# Patient Record
Sex: Male | Born: 1960 | Race: White | Hispanic: No | Marital: Married | Smoking: Former smoker
Health system: Southern US, Community
[De-identification: ages and names within clinical notes are randomized; demographics above are authoritative.]

## PROBLEM LIST (undated history)

## (undated) DIAGNOSIS — E119 Type 2 diabetes mellitus without complications: Secondary | ICD-10-CM

## (undated) DIAGNOSIS — I10 Essential (primary) hypertension: Secondary | ICD-10-CM

## (undated) DIAGNOSIS — K1230 Oral mucositis (ulcerative), unspecified: Secondary | ICD-10-CM

## (undated) HISTORY — DX: Type 2 diabetes mellitus without complications: E11.9

## (undated) HISTORY — DX: Oral mucositis (ulcerative), unspecified: K12.30

## (undated) HISTORY — DX: Essential (primary) hypertension: I10

---

## 1981-05-08 HISTORY — PX: FEMUR SURGERY: SHX943

## 2020-03-10 ENCOUNTER — Other Ambulatory Visit: Payer: Self-pay

## 2020-03-10 ENCOUNTER — Encounter: Payer: Self-pay | Admitting: Family Medicine

## 2020-03-10 ENCOUNTER — Ambulatory Visit (INDEPENDENT_AMBULATORY_CARE_PROVIDER_SITE_OTHER): Payer: 59 | Admitting: Family Medicine

## 2020-03-10 VITALS — BP 189/96 | HR 77 | Temp 98.7°F | Resp 20 | Ht 70.0 in | Wt 345.0 lb

## 2020-03-10 DIAGNOSIS — L089 Local infection of the skin and subcutaneous tissue, unspecified: Secondary | ICD-10-CM

## 2020-03-10 DIAGNOSIS — Z7689 Persons encountering health services in other specified circumstances: Secondary | ICD-10-CM

## 2020-03-10 DIAGNOSIS — R635 Abnormal weight gain: Secondary | ICD-10-CM | POA: Diagnosis not present

## 2020-03-10 DIAGNOSIS — E1165 Type 2 diabetes mellitus with hyperglycemia: Secondary | ICD-10-CM | POA: Diagnosis not present

## 2020-03-10 DIAGNOSIS — Z125 Encounter for screening for malignant neoplasm of prostate: Secondary | ICD-10-CM

## 2020-03-10 DIAGNOSIS — K047 Periapical abscess without sinus: Secondary | ICD-10-CM

## 2020-03-10 DIAGNOSIS — Z13220 Encounter for screening for lipoid disorders: Secondary | ICD-10-CM

## 2020-03-10 DIAGNOSIS — I10 Essential (primary) hypertension: Secondary | ICD-10-CM

## 2020-03-10 DIAGNOSIS — R221 Localized swelling, mass and lump, neck: Secondary | ICD-10-CM

## 2020-03-10 LAB — POCT GLYCOSYLATED HEMOGLOBIN (HGB A1C): Hemoglobin A1C: 7.1 % — AB (ref 4.0–5.6)

## 2020-03-10 MED ORDER — HYDROCHLOROTHIAZIDE 12.5 MG PO TABS: 12.5000 mg | ORAL_TABLET | Freq: Every day | ORAL | 1 refills | Status: DC

## 2020-03-10 MED ORDER — LEVOFLOXACIN 750 MG PO TABS: 750.0000 mg | ORAL_TABLET | Freq: Every day | ORAL | 0 refills | Status: DC

## 2020-03-10 MED ORDER — CLINDAMYCIN HCL 300 MG PO CAPS: 300.0000 mg | ORAL_CAPSULE | Freq: Four times a day (QID) | ORAL | 0 refills | Status: DC

## 2020-03-10 MED ORDER — MUPIROCIN 2 % EX OINT: 1.0000 "application " | TOPICAL_OINTMENT | Freq: Two times a day (BID) | CUTANEOUS | 1 refills | Status: DC

## 2020-03-10 MED ORDER — AMLODIPINE BESYLATE 10 MG PO TABS: 10.0000 mg | ORAL_TABLET | Freq: Every day | ORAL | 1 refills | Status: DC

## 2020-03-10 MED ORDER — METRONIDAZOLE 500 MG PO TABS: 500.0000 mg | ORAL_TABLET | Freq: Two times a day (BID) | ORAL | 0 refills | Status: DC

## 2020-03-10 MED ORDER — METFORMIN HCL 500 MG PO TABS: 500.0000 mg | ORAL_TABLET | Freq: Two times a day (BID) | ORAL | 1 refills | Status: AC

## 2020-03-10 NOTE — Patient Instructions (Addendum)
I have put in an order for a head and neck ultrasound.  You should receive a call in the next few days to get this scheduled.  A referral to Ear, Nose and Throat has been placed today.  If you have not heard from the specialty office or our referral coordinator within 1 week, please let us know and we will follow up with the referral coordinator for an update.  I have sent in a prescription for amlodipine 10mg  to take 1 tablet daily, hydrochlorothiazide 12.5mg  to take 1 tablet daily and for metformin 500mg  to take 1 tablet twice daily with meals.  Have your labs drawn and we will contact you with the results  I have sent in a prescription for metronidazole 500mg  2x per day for the next 7 days and for levofloxacin 750mg  to take 1 daily x 5 days  OPTIONS FOR DENTAL FOLLOW UP CARE   No insurance or limited coverage / Most offer flat fee or sliding scale payment plan   [place], [place] - [address] [phone] [place] is a Environmental consultant. Their mission is to provide exceptional, gentle dental care to our patients at the highest quality in a friendly and caring environment. They provide a wide range of services including but not limited to routine dental care, extractions, root   [place] 23 miles away from [place]. [place], [place] - [address] [phone] [facility]: 23 miles from [place] Exams:Includes x-rays and developing a treatment plan.Basic and Hygiene Services:.Fillings.Extractions.Dentures.Partials.Crowns (Caps).Cleanings - Standard     Deep Cleanings.Sealants.Fluoride treatments.Basic Oral Cancer ScreeningsSliding Fees Scale av email website See Clinic Full Details  [facility] 33 miles away from [place], [place] - [address] [phone]) [facility]: 33 miles from [place]  including: Cleanings Fillings Extractions Dentures Partials All ages welcome.   [place]   Address: [address], [place], [place] [address] Hours: Tues 4:15PM-8PM // Weds 9am - 12pm // Thurs 1pm - 8pm (Closed other days) Phone: [phone]    [facility] [phone] Select option 1 for emergencies   Location: [facility], [place], [place], [place] [facility] Hours: No walk-ins accepted - call the day before to schedule an appointment. Check in times are 9:30 am and 1:30 pm. Services: Simple extractions, temporary fillings, pulpectomy/pulp debridement, uncomplicated abscess drainage. Payment Options: PAYMENT IS DUE AT THE TIME OF SERVICE.  Fee is usually $100-200, additional surgical procedures (e.g. abscess drainage) may be extra. Cash, checks, Visa/MasterCard accepted.  Can file Medicaid if patient is covered for dental - patient should call case worker to check. No discount for [facility] patients. Best way to get seen: MUST call the day before and get onto the schedule. Can usually be seen the next 1-2 days. No walk-ins accepted.    [facility] [phone]) This clinic caters to the indigent population and is on a lottery system. Location: [employer] [facility], [place], [place], [place] [facility] Hours: Wednesdays from 6pm - 9pm, patients seen by a lottery system. For dates, call or go to [email or web address] Services: Cleanings, fillings and simple extractions. Payment Options: DENTAL WORK IS FREE OF CHARGE. Bring proof of income or support. Best way to get seen: Arrive at 5:15 pm - this is  a lottery, NOT first come/first serve, so arriving earlier will not increase your chances of being seen.     [facility] and [place] [employer] [phone])  [facility] [phone])  [place] [phone])         [facility] [phone]    Location: [place], [place] [facility] Hours: M, W, Th, F 8am or 1:30pm, Tues 9a or 1:30 - first come/first served. Services: Simple extractions, temporary fillings, uncomplicated abscess drainage.  You do not need to be an [facility] resident. Payment Options: PAYMENT IS DUE AT THE TIME OF SERVICE. Dental insurance, otherwise sliding scale - bring proof of income or support. Depending on income and treatment needed, cost is usually $50-200. Best way to get seen: Arrive early as it is first come/first served.     [facility] [phone]    Location: [facility] Hours: Mon-Thu 8a-5p Services: Most basic dental services including extractions and fillings. Payment Options: PAYMENT IS DUE AT THE TIME OF SERVICE. Sliding scale, up to 50% off - bring proof if income or support. Medicaid with dental option accepted. Best way to get seen: Call to schedule an appointment, can usually be seen within 2 weeks OR they will try to see walk-ins - show up at [place] or 2p (you may have to wait).       [facility] RESIDENTS ONLY    Location: [facility], [place] [address], [place], [place] [address] Clinic Hours: By appointment only. Monday - Thursday 8am-5pm, Friday 8am-12pm Services: Cleanings, fillings, extractions. Payment Options: PAYMENT IS DUE AT THE TIME OF SERVICE. Cash, Visa or MasterCard. Sliding scale - $30 minimum per service. Best way to get seen: Come in to office, complete packet and make an appointment - need proof of income or support monies for each household member  and proof of [facility] residence. Usually takes about a month to get in.     [facility] [phone]    Location: [address], [place] [facility] Hours: Walk-in Urgent Care Dental Services are offered Monday-Friday mornings only. The numbers of emergencies accepted daily is limited to the number of providers available. Maximum 15 - Mondays, Wednesdays & Thursdays Maximum 10 - Tuesdays & Fridays Services: You do not need to be a [facility] resident to be seen for a dental emergency. Emergencies are defined as pain, swelling, abnormal bleeding, or dental trauma. Walkins will receive x-rays if needed. NOTE: Dental cleaning is not an emergency. Payment Options: PAYMENT IS DUE AT THE TIME OF SERVICE. Minimum co-pay is $40.00 for uninsured patients. Minimum co-pay is $3.00 for Medicaid with dental coverage. Dental Insurance is accepted and must be presented at time of visit. Medicare does not cover dental. Forms of payment: Cash, credit card, checks. Best way to get seen: If not previously registered with the clinic, walk-in dental registration begins at 7:15 am and is on a first come/first serve basis. If previously registered with the clinic, call to make an appointment.    For children / young adults - age 35 to 2 / pregnant patients  [facility] 20 miles away from [place], [place] - [address] [phone] [facility]: 20 miles from [facility]. [facility]. Provides exams, treatment, cleanings, and emergency care for financially eligible children. They take children up to age 63 who are enrolled in Medicaid or [place]; pregnant women with a Medicaid  [place], [place] [address] [phone] The  [place] offers cleaning, fluoride treatment, infant oral care, tooth brushing/flossing instruction, nutrition counseling, sealants, fillings, crowns, extractions and emergency treatment for children 25-33 years of age.Charges are based on family income.&nbs      We will plan to see you back in 1-2 weeks for hypertension follow up visit  You will receive a survey after today's visit either digitally by e-mail or paper by [place] mail. Your experiences and feedback matter to [place].  Please respond so we know how we are doing as we provide care for you.  Call us with any questions/concerns/needs.  It is my goal to be available to you for your health concerns.  Thanks for choosing me to be a partner in your healthcare needs!  Harlin Rain, FNP-C Family Nurse Practitioner [facility] Phone: [phone]

## 2020-03-10 NOTE — Progress Notes (Signed)
Subjective:    Patient ID: Joshua Bray, male    DOB: 12-29-1960, 59 y.o.   MRN: [id number]  Joshua Bray is a 59 y.o. male presenting on 03/10/2020 for Dent (recently located from [place]. Pt has a history of diabetes, hypertension and been off medications for 3-4 years ) and Dental Pain (pt currently in pain due to 2 broken molars on the Rt side. x 2 yrs. Pt haven't seen a dentist since x 8 yrs.)   HPI  Health Maintenance:  [name] presents to clinic as a new patient to establish for primary care.  He has recently relocated from [place].  His previous PCP was Sheffield Slider, PA [facility] [place].  Records will be requested.  Past medical, family, and surgical history reviewed w/ pt.  [name] has acute concerns for having chronic conditions and being without medications for approximately 3-4 years.  He also has right sided dental pain x 2 years with recent worsening of swelling/pain within the past few weeks.  Reports last time he has met with a dentist was > 8 years ago.   No flowsheet data found.  Social History   Tobacco Use  . Smoking status: Former Smoker    Years: 4.00    Quit date: 03/10/1990    Years since quitting: 30.0  . Smokeless tobacco: Never Used  Vaping Use  . Vaping Use: Never used  Substance Use Topics  . Alcohol use: Yes    Alcohol/week: 5.0 standard drinks    Types: 5 Cans of beer per week  . Drug use: Not Currently    Review of Systems  Constitutional: Negative.   HENT: Positive for dental problem and facial swelling. Negative for congestion, drooling, ear discharge, ear pain, hearing loss, mouth sores, nosebleeds, postnasal drip, rhinorrhea, sinus pressure, sinus pain, sneezing, sore throat, tinnitus, trouble swallowing and voice change.   Eyes: Negative.   Respiratory: Negative.   Cardiovascular: Negative.   Gastrointestinal: Negative.   Endocrine: Negative.   Genitourinary: Negative.     Musculoskeletal: Negative.   Skin: Negative.   Allergic/Immunologic: Negative.   Neurological: Negative.   Hematological: Positive for adenopathy. Does not bruise/bleed easily.  Psychiatric/Behavioral: Negative.    Per HPI unless specifically indicated above     Objective:    BP (!) 189/96 (BP Location: Right Arm, Cuff Size: Large)   Pulse 77   Temp 98.7 F (37.1 C) (Oral)   Resp 20   Ht 5' 10"  (1.778 m)   Wt (!) 345 lb (156.5 kg)   SpO2 97%   BMI 49.50 kg/m   Wt Readings from Last 3 Encounters:  03/10/20 (!) 345 lb (156.5 kg)    Physical Exam Vitals and nursing note reviewed.  Constitutional:      General: He is not in acute distress.    Appearance: Normal appearance. He is well-developed and well-groomed. He is morbidly obese. He is not ill-appearing or toxic-appearing.  HENT:     Head: Normocephalic and atraumatic.     Nose:     Comments: [name] is in place, covering mouth and nose.    Mouth/Throat:     Lips: Pink.     Mouth: Mucous membranes are moist.     Dentition: Dental caries, dental abscesses and gum lesions present.     Pharynx: Posterior oropharyngeal erythema present. No oropharyngeal exudate.      Comments: Swelling, enlargement (all under tongue) with area of ulceration  2nd area with swelling  off back gums, similar to soft cyst Eyes:     General:        Right eye: No discharge.        Left eye: No discharge.     Extraocular Movements: Extraocular movements intact.     Conjunctiva/sclera: Conjunctivae normal.     Pupils: Pupils are equal, round, and reactive to light.  Neck:   Cardiovascular:     Rate and Rhythm: Normal rate and regular rhythm.     Pulses: Normal pulses.     Heart sounds: Normal heart sounds. No murmur heard.  No friction rub. No gallop.   Pulmonary:     Effort: Pulmonary effort is normal. No respiratory distress.     Breath sounds: Normal breath sounds.  Musculoskeletal:     Cervical back: Edema present. No erythema,  rigidity or tenderness. Normal range of motion.     Right lower leg: No edema.     Left lower leg: No edema.  Lymphadenopathy:     Cervical: Cervical adenopathy present.     Right cervical: Superficial cervical adenopathy present.     Left cervical: Superficial cervical adenopathy present.  Skin:    General: Skin is warm and dry.     Capillary Refill: Capillary refill takes less than 2 seconds.  Neurological:     General: No focal deficit present.     Mental Status: He is alert and oriented to person, place, and time.  Psychiatric:        Attention and Perception: Attention and perception normal.        Mood and Affect: Mood and affect normal.        Speech: Speech normal.        Behavior: Behavior normal. Behavior is cooperative.        Thought Content: Thought content normal.        Cognition and Memory: Cognition and memory normal.        Judgment: Judgment normal.    Results for orders placed or performed in visit on 03/10/20  CBC with Differential  Result Value Ref Range   WBC 5.2 3.8 - 10.8 Thousand/uL   RBC 5.36 4.20 - 5.80 Million/uL   Hemoglobin 17.0 13.2 - 17.1 g/dL   HCT 48.9 38 - 50 %   MCV 91.2 80.0 - 100.0 fL   MCH 31.7 27.0 - 33.0 pg   MCHC 34.8 32.0 - 36.0 g/dL   RDW 12.8 11.0 - 15.0 %   Platelets 195 140 - 400 Thousand/uL   MPV 11.2 7.5 - 12.5 fL   Neutro Abs 2,543 1,500 - 7,800 cells/uL   Lymphs Abs 1,648 850 - 3,900 cells/uL   Absolute Monocytes 556 200 - 950 cells/uL   Eosinophils Absolute 385 15.0 - 500.0 cells/uL   Basophils Absolute 68 0.0 - 200.0 cells/uL   Neutrophils Relative % 48.9 %   Total Lymphocyte 31.7 %   Monocytes Relative 10.7 %   Eosinophils Relative 7.4 %   Basophils Relative 1.3 %  COMPLETE METABOLIC PANEL WITH GFR  Result Value Ref Range   Glucose, Bld 166 (H) 65 - 99 mg/dL   BUN 13 7 - 25 mg/dL   Creat 1.08 0.70 - 1.33 mg/dL   GFR, Est Non African American 75 > OR = 60 mL/min/1.19m   GFR, Est African American 87 > OR = 60  mL/min/1.756m  BUN/Creatinine Ratio NOT APPLICABLE 6 - 22 (calc)   Sodium 139 135 - 146 mmol/L   Potassium  4.2 3.5 - 5.3 mmol/L   Chloride 103 98 - 110 mmol/L   CO2 26 20 - 32 mmol/L   Calcium 9.3 8.6 - 10.3 mg/dL   Total Protein 6.9 6.1 - 8.1 g/dL   Albumin 4.1 3.6 - 5.1 g/dL   Globulin 2.8 1.9 - 3.7 g/dL (calc)   AG Ratio 1.5 1.0 - 2.5 (calc)   Total Bilirubin 0.6 0.2 - 1.2 mg/dL   Alkaline phosphatase (APISO) 90 35 - 144 U/L   AST 21 10 - 35 U/L   ALT 23 9 - 46 U/L  Lipid Profile  Result Value Ref Range   Cholesterol 226 (H) <200 mg/dL   HDL 35 (L) > OR = 40 mg/dL   Triglycerides 307 (H) <150 mg/dL   LDL Cholesterol (Calc) 144 (H) mg/dL (calc)   Total CHOL/HDL Ratio 6.5 (H) <5.0 (calc)   Non-HDL Cholesterol (Calc) 191 (H) <130 mg/dL (calc)  TSH + free T4  Result Value Ref Range   TSH W/REFLEX TO FT4 1.41 0.40 - 4.50 mIU/L  PSA  Result Value Ref Range   PSA 0.84 < OR = 4.0 ng/mL      Assessment & Plan:   Problem List Items Addressed This Visit      Cardiovascular and Mediastinum   Essential hypertension    Uncontrolled hypertension, reports having been off medications for approx 3-4 years.  Reports was previously on amlodipine 17m daily.  Will restart on amlodipine 173mdaily and add in HCTZ 12.61m39mor blood pressure control.  Discussed importance of taking as directed and will RTC in 4 2 weeks for re-evaluation of blood pressure.      Relevant Medications   amLODipine (NORVASC) 10 MG tablet   hydrochlorothiazide (HYDRODIURIL) 12.5 MG tablet   Other Relevant Orders   CBC with Differential (Completed)   COMPLETE METABOLIC PANEL WITH GFR (Completed)     Digestive   Tooth infection    Right upper and lower molar infections.  Unable to tolerate PCN or clindamycin.  Will tx with levaquin and flagyl, per Up to Date recommendations.  Provided with resources for local dentists to schedule with, and providers local that take patients without dental insurance.  Encouraged  to contact and schedule an appointment.      Relevant Medications   mupirocin ointment (BACTROBAN) 2 %   metroNIDAZOLE (FLAGYL) 500 MG tablet   levofloxacin (LEVAQUIN) 750 MG tablet     Endocrine   Type 2 diabetes mellitus with hyperglycemia, without long-term current use of insulin (HCC)    A1C 7.1% today.  Will restart on metformin 500m50mD WC.  Will re-evaluate tolerance for metformin in 2 weeks and encourage blood glucose measurements and logging along with dietary/lifestyle modifications.      Relevant Medications   metFORMIN (GLUCOPHAGE) 500 MG tablet   Other Relevant Orders   CBC with Differential (Completed)   COMPLETE METABOLIC PANEL WITH GFR (Completed)   POCT HgB A1C   POCT Urinalysis Dipstick     Musculoskeletal and Integument   Skin infection    Has a history of MRSA - reports he keeps mupirocin on hand should he have an ingrown hair as once it had progressed to a skin infection requiring IV ABx.  Will send in refill on mupirocin to pharmacy on file.      Relevant Medications   mupirocin ointment (BACTROBAN) 2 %   metroNIDAZOLE (FLAGYL) 500 MG tablet     Other   Encounter to establish care with new  doctor - Primary    New patient establishment to [facility] for primary care services.  Will have baseline labs drawn today, BP follow up in 2 weeks and CPE once BP has stabilized      Screening, lipid    Screening lipid panel ordered      Relevant Orders   Lipid Profile (Completed)   Screening for malignant neoplasm of prostate    Screening PSA ordered      Relevant Orders   PSA (Completed)   Weight gain    TSH ordered based on increased weight      Relevant Orders   TSH + free T4 (Completed)   Neck swelling    Bilateral neck swelling.  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  Reports it has grown in side, is non-tender, and non-mobile.    Has right sided swelling of the anterior cervical lymph node chain, hard, non-mobile, non-tender.    Discussed sending for referral to ENT provider and will order a neck ultrasound for evaluation, that ENT provider will likely order a CT and based on their treatment plan, may require surgery.  Patient agreeable to ultrasound and ENT referral.  Plan: 1. Head and neck US ordered 2. Referral to ENT placed      Relevant Orders   US Soft Tissue Head/Neck (NON-THYROID)   Ambulatory referral to ENT      Meds ordered this encounter  Medications  . amLODipine (NORVASC) 10 MG tablet    Sig: Take 1 tablet (10 mg total) by mouth daily.    Dispense:  90 tablet    Refill:  1  . hydrochlorothiazide (HYDRODIURIL) 12.5 MG tablet    Sig: Take 1 tablet (12.5 mg total) by mouth daily.    Dispense:  90 tablet    Refill:  1  . metFORMIN (GLUCOPHAGE) 500 MG tablet    Sig: Take 1 tablet (500 mg total) by mouth 2 (two) times daily with a meal.    Dispense:  180 tablet    Refill:  1  . mupirocin ointment (BACTROBAN) 2 %    Sig: Apply 1 application topically 2 (two) times daily.    Dispense:  22 g    Refill:  1  . DISCONTD: clindamycin (CLEOCIN) 300 MG capsule    Sig: Take 1 capsule (300 mg total) by mouth 4 (four) times daily for 5 days.    Dispense:  20 capsule    Refill:  0  . metroNIDAZOLE (FLAGYL) 500 MG tablet    Sig: Take 1 tablet (500 mg total) by mouth 2 (two) times daily for 7 days.    Dispense:  14 tablet    Refill:  0  . levofloxacin (LEVAQUIN) 750 MG tablet    Sig: Take 1 tablet (750 mg total) by mouth daily.    Dispense:  5 tablet    Refill:  0    Follow up plan: Return in about 2 weeks (around 03/24/2020) for HTN F/U Visit.   Harlin Rain, [place] Family Nurse Practitioner [facility] 03/10/2020, 5:14 PM

## 2020-03-11 ENCOUNTER — Other Ambulatory Visit: Payer: Self-pay | Admitting: Family Medicine

## 2020-03-11 DIAGNOSIS — E781 Pure hyperglyceridemia: Secondary | ICD-10-CM

## 2020-03-11 DIAGNOSIS — R221 Localized swelling, mass and lump, neck: Secondary | ICD-10-CM | POA: Insufficient documentation

## 2020-03-11 DIAGNOSIS — K047 Periapical abscess without sinus: Secondary | ICD-10-CM | POA: Insufficient documentation

## 2020-03-11 DIAGNOSIS — E782 Mixed hyperlipidemia: Secondary | ICD-10-CM

## 2020-03-11 DIAGNOSIS — L089 Local infection of the skin and subcutaneous tissue, unspecified: Secondary | ICD-10-CM | POA: Insufficient documentation

## 2020-03-11 LAB — CBC WITH DIFFERENTIAL/PLATELET
Absolute Monocytes: 556 cells/uL (ref 200–950)
Basophils Absolute: 68 cells/uL (ref 0–200)
Basophils Relative: 1.3 %
Eosinophils Absolute: 385 cells/uL (ref 15–500)
Eosinophils Relative: 7.4 %
HCT: 48.9 % (ref 38.5–50.0)
Hemoglobin: 17 g/dL (ref 13.2–17.1)
Lymphs Abs: 1648 cells/uL (ref 850–3900)
MCH: 31.7 pg (ref 27.0–33.0)
MCHC: 34.8 g/dL (ref 32.0–36.0)
MCV: 91.2 fL (ref 80.0–100.0)
MPV: 11.2 fL (ref 7.5–12.5)
Monocytes Relative: 10.7 %
Neutro Abs: 2543 cells/uL (ref 1500–7800)
Neutrophils Relative %: 48.9 %
Platelets: 195 10*3/uL (ref 140–400)
RBC: 5.36 10*6/uL (ref 4.20–5.80)
RDW: 12.8 % (ref 11.0–15.0)
Total Lymphocyte: 31.7 %
WBC: 5.2 10*3/uL (ref 3.8–10.8)

## 2020-03-11 LAB — COMPLETE METABOLIC PANEL WITH GFR
AG Ratio: 1.5 (calc) (ref 1.0–2.5)
ALT: 23 U/L (ref 9–46)
AST: 21 U/L (ref 10–35)
Albumin: 4.1 g/dL (ref 3.6–5.1)
Alkaline phosphatase (APISO): 90 U/L (ref 35–144)
BUN: 13 mg/dL (ref 7–25)
CO2: 26 mmol/L (ref 20–32)
Calcium: 9.3 mg/dL (ref 8.6–10.3)
Chloride: 103 mmol/L (ref 98–110)
Creat: 1.08 mg/dL (ref 0.70–1.33)
GFR, Est African American: 87 mL/min/{1.73_m2} (ref >=60)
GFR, Est Non African American: 75 mL/min/{1.73_m2} (ref >=60)
Globulin: 2.8 g/dL (calc) (ref 1.9–3.7)
Glucose, Bld: 166 mg/dL — ABNORMAL HIGH (ref 65–99)
Potassium: 4.2 mmol/L (ref 3.5–5.3)
Sodium: 139 mmol/L (ref 135–146)
Total Bilirubin: 0.6 mg/dL (ref 0.2–1.2)
Total Protein: 6.9 g/dL (ref 6.1–8.1)

## 2020-03-11 LAB — LIPID PANEL
Cholesterol: 226 mg/dL — ABNORMAL HIGH (ref <200)
HDL: 35 mg/dL — ABNORMAL LOW (ref >=40)
LDL Cholesterol (Calc): 144 mg/dL (calc) — ABNORMAL HIGH
Non-HDL Cholesterol (Calc): 191 mg/dL (calc) — ABNORMAL HIGH (ref <130)
Total CHOL/HDL Ratio: 6.5 (calc) — ABNORMAL HIGH (ref <5.0)
Triglycerides: 307 mg/dL — ABNORMAL HIGH (ref <150)

## 2020-03-11 LAB — TSH+FREE T4: TSH W/REFLEX TO FT4: 1.41 mIU/L (ref 0.40–4.50)

## 2020-03-11 LAB — PSA: PSA: 0.84 ng/mL (ref <=4.0)

## 2020-03-11 MED ORDER — ROSUVASTATIN CALCIUM 20 MG PO TABS: 20.0000 mg | ORAL_TABLET | Freq: Every day | ORAL | 3 refills | Status: DC

## 2020-03-11 MED ORDER — EZETIMIBE 10 MG PO TABS: 10.0000 mg | ORAL_TABLET | Freq: Every day | ORAL | 3 refills | Status: DC

## 2020-03-11 NOTE — Assessment & Plan Note (Signed)
TSH ordered based on increased weight

## 2020-03-11 NOTE — Assessment & Plan Note (Signed)
Bilateral neck swelling.  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  Reports it has grown in side, is non-tender, and non-mobile.    Has right sided swelling of the anterior cervical lymph node chain, hard, non-mobile, non-tender.    Discussed sending for referral to ENT provider and will order a neck ultrasound for evaluation, that ENT provider will likely order a CT and based on their treatment plan, may require surgery.  Patient agreeable to ultrasound and ENT referral.  Plan: 1. Head and neck US ordered 2. Referral to ENT placed

## 2020-03-11 NOTE — Assessment & Plan Note (Signed)
New patient establishment to [facility] for primary care services.  Will have baseline labs drawn today, BP follow up in 2 weeks and CPE once BP has stabilized

## 2020-03-11 NOTE — Assessment & Plan Note (Signed)
Screening lipid panel ordered.

## 2020-03-11 NOTE — Assessment & Plan Note (Signed)
Uncontrolled hypertension, reports having been off medications for approx 3-4 years.  Reports was previously on amlodipine 10mg  daily.  Will restart on amlodipine 10mg  daily and add in HCTZ 12.5mg  for blood pressure control.  Discussed importance of taking as directed and will RTC in 4 2 weeks for re-evaluation of blood pressure.

## 2020-03-11 NOTE — Assessment & Plan Note (Signed)
Has a history of MRSA - reports he keeps mupirocin on hand should he have an ingrown hair as once it had progressed to a skin infection requiring IV ABx.  Will send in refill on mupirocin to pharmacy on file.

## 2020-03-11 NOTE — Assessment & Plan Note (Signed)
Screening PSA ordered.

## 2020-03-11 NOTE — Assessment & Plan Note (Signed)
A1C 7.1% today.  Will restart on metformin 500mg  BID WC.  Will re-evaluate tolerance for metformin in 2 weeks and encourage blood glucose measurements and logging along with dietary/lifestyle modifications.

## 2020-03-11 NOTE — Assessment & Plan Note (Signed)
Right upper and lower molar infections.  Unable to tolerate PCN or clindamycin.  Will tx with levaquin and flagyl, per Up to Date recommendations.  Provided with resources for local dentists to schedule with, and providers local that take patients without dental insurance.  Encouraged to contact and schedule an appointment.

## 2020-03-12 ENCOUNTER — Other Ambulatory Visit: Payer: Self-pay | Admitting: Family Medicine

## 2020-03-12 ENCOUNTER — Telehealth: Payer: Self-pay

## 2020-03-12 DIAGNOSIS — K047 Periapical abscess without sinus: Secondary | ICD-10-CM

## 2020-03-12 MED ORDER — CLINDAMYCIN HCL 300 MG PO CAPS: 300.0000 mg | ORAL_CAPSULE | Freq: Four times a day (QID) | ORAL | 0 refills | Status: AC

## 2020-03-12 NOTE — Telephone Encounter (Signed)
Every antibiotic has side effects, since he is unable to tolerate penicillins and declined the clindamycin due to GI upset, this levofloxacin coupled with the metronidazole are really the only oral antibiotic options we are left with.  The other option would be to just take the metronidazole and see if this resolves the infection.  Other medications would be IV antibiotics and would need to be done through an infusion clinic or the ER.

## 2020-03-12 NOTE — Telephone Encounter (Signed)
Copied from [place] [id number]. Topic: General - Other >> Mar 12, 2020  2:59 PM Hinda Lenis D wrote: PT requesting a call back, he will not take this medication after reading side effects, looking for an alternative / evofloxacin (LEVAQUIN) 750 MG tablet [[id number]] / please advise

## 2020-03-12 NOTE — Telephone Encounter (Signed)
The pt would like to switch the abx from Levofloxacin to clindamycin like you recommended prior.

## 2020-03-17 ENCOUNTER — Ambulatory Visit
Admission: RE | Admit: 2020-03-17 | Discharge: 2020-03-17 | Disposition: A | Discharge status: Home / Self Care | Payer: 59 | Source: Ambulatory Visit | Attending: Family Medicine | Admitting: Family Medicine

## 2020-03-17 ENCOUNTER — Other Ambulatory Visit: Payer: Self-pay

## 2020-03-17 DIAGNOSIS — R221 Localized swelling, mass and lump, neck: Secondary | ICD-10-CM | POA: Insufficient documentation

## 2020-03-24 ENCOUNTER — Ambulatory Visit: Payer: Self-pay | Admitting: Family Medicine

## 2020-03-26 ENCOUNTER — Other Ambulatory Visit: Payer: Self-pay | Admitting: Otolaryngology

## 2020-03-26 DIAGNOSIS — R590 Localized enlarged lymph nodes: Secondary | ICD-10-CM

## 2020-03-26 DIAGNOSIS — R221 Localized swelling, mass and lump, neck: Secondary | ICD-10-CM

## 2020-04-05 ENCOUNTER — Ambulatory Visit: Payer: Self-pay | Admitting: Family Medicine

## 2020-04-06 NOTE — Progress Notes (Signed)
Patient for Cervical LN biopsy 04/08/2020, called and spoke with patient on phone with instructions given. Made aware to be here @ 0930, NPO after MN if deciding to have sedation along with driver for discharge after procedure. Stated understanding.

## 2020-04-07 ENCOUNTER — Other Ambulatory Visit: Payer: Self-pay

## 2020-04-07 ENCOUNTER — Other Ambulatory Visit: Payer: Self-pay | Admitting: Student

## 2020-04-07 ENCOUNTER — Ambulatory Visit
Admission: RE | Admit: 2020-04-07 | Discharge: 2020-04-07 | Disposition: A | Discharge status: Home / Self Care | Payer: 59 | Source: Ambulatory Visit | Attending: Otolaryngology | Admitting: Otolaryngology

## 2020-04-07 DIAGNOSIS — R221 Localized swelling, mass and lump, neck: Secondary | ICD-10-CM | POA: Diagnosis present

## 2020-04-07 MED ORDER — IOHEXOL 300 MG/ML  SOLN
75.0000 mL | Freq: Once | INTRAMUSCULAR | Status: AC | PRN
  Administered 2020-04-07: 75 mL via INTRAVENOUS

## 2020-04-08 ENCOUNTER — Ambulatory Visit
Admission: RE | Admit: 2020-04-08 | Discharge: 2020-04-08 | Disposition: A | Discharge status: Home / Self Care | Payer: 59 | Source: Ambulatory Visit | Attending: Otolaryngology | Admitting: Otolaryngology

## 2020-04-08 ENCOUNTER — Other Ambulatory Visit: Payer: Self-pay

## 2020-04-08 DIAGNOSIS — R590 Localized enlarged lymph nodes: Secondary | ICD-10-CM

## 2020-04-08 DIAGNOSIS — C770 Secondary and unspecified malignant neoplasm of lymph nodes of head, face and neck: Secondary | ICD-10-CM | POA: Insufficient documentation

## 2020-04-08 LAB — GLUCOSE, CAPILLARY: Glucose-Capillary: 165 mg/dL — ABNORMAL HIGH (ref 70–99)

## 2020-04-08 MED ORDER — SODIUM CHLORIDE 0.9 % IV SOLN: INTRAVENOUS | Status: DC

## 2020-04-08 NOTE — Procedures (Addendum)
Interventional Radiology Procedure Note  Procedure: US Guided Biopsy of left cervical lymph node  Complications: None  Estimated Blood Loss: < 10 mL  Findings: 45 G core biopsy of left cervical lymph node mass performed under US guidance.  Six core samples obtained and sent to Pathology.  Venetia Night. Kathlene Cote, M.D Pager:  [phone]

## 2020-04-09 ENCOUNTER — Inpatient Hospital Stay: Payer: 59

## 2020-04-09 ENCOUNTER — Inpatient Hospital Stay: Payer: 59 | Attending: Internal Medicine | Admitting: Internal Medicine

## 2020-04-09 ENCOUNTER — Encounter: Payer: Self-pay | Admitting: Internal Medicine

## 2020-04-09 DIAGNOSIS — Z79899 Other long term (current) drug therapy: Secondary | ICD-10-CM | POA: Diagnosis not present

## 2020-04-09 DIAGNOSIS — C770 Secondary and unspecified malignant neoplasm of lymph nodes of head, face and neck: Secondary | ICD-10-CM | POA: Diagnosis not present

## 2020-04-09 DIAGNOSIS — C01 Malignant neoplasm of base of tongue: Secondary | ICD-10-CM | POA: Insufficient documentation

## 2020-04-09 DIAGNOSIS — R1310 Dysphagia, unspecified: Secondary | ICD-10-CM | POA: Diagnosis not present

## 2020-04-09 DIAGNOSIS — E119 Type 2 diabetes mellitus without complications: Secondary | ICD-10-CM | POA: Diagnosis not present

## 2020-04-09 DIAGNOSIS — Z5111 Encounter for antineoplastic chemotherapy: Secondary | ICD-10-CM | POA: Insufficient documentation

## 2020-04-09 DIAGNOSIS — Z7984 Long term (current) use of oral hypoglycemic drugs: Secondary | ICD-10-CM | POA: Diagnosis not present

## 2020-04-09 DIAGNOSIS — Z7189 Other specified counseling: Secondary | ICD-10-CM

## 2020-04-09 DIAGNOSIS — Z87891 Personal history of nicotine dependence: Secondary | ICD-10-CM | POA: Insufficient documentation

## 2020-04-09 DIAGNOSIS — I10 Essential (primary) hypertension: Secondary | ICD-10-CM | POA: Insufficient documentation

## 2020-04-09 LAB — CBC WITH DIFFERENTIAL/PLATELET
Abs Immature Granulocytes: 0.02 10*3/uL (ref 0.00–0.07)
Basophils Absolute: 0.1 10*3/uL (ref 0.0–0.1)
Basophils Relative: 1 %
Eosinophils Absolute: 0.4 10*3/uL (ref 0.0–0.5)
Eosinophils Relative: 6 %
HCT: 47.5 % (ref 39.0–52.0)
Hemoglobin: 16.8 g/dL (ref 13.0–17.0)
Immature Granulocytes: 0 %
Lymphocytes Relative: 31 %
Lymphs Abs: 2.1 10*3/uL (ref 0.7–4.0)
MCH: 32.1 pg (ref 26.0–34.0)
MCHC: 35.4 g/dL (ref 30.0–36.0)
MCV: 90.6 fL (ref 80.0–100.0)
Monocytes Absolute: 0.8 10*3/uL (ref 0.1–1.0)
Monocytes Relative: 12 %
Neutro Abs: 3.3 10*3/uL (ref 1.7–7.7)
Neutrophils Relative %: 50 %
Platelets: 212 10*3/uL (ref 150–400)
RBC: 5.24 MIL/uL (ref 4.22–5.81)
RDW: 12.9 % (ref 11.5–15.5)
WBC: 6.7 10*3/uL (ref 4.0–10.5)
nRBC: 0 % (ref 0.0–0.2)

## 2020-04-09 LAB — COMPREHENSIVE METABOLIC PANEL
ALT: 36 U/L (ref 0–44)
AST: 31 U/L (ref 15–41)
Albumin: 4.3 g/dL (ref 3.5–5.0)
Alkaline Phosphatase: 77 U/L (ref 38–126)
Anion gap: 12 (ref 5–15)
BUN: 21 mg/dL — ABNORMAL HIGH (ref 6–20)
CO2: 26 mmol/L (ref 22–32)
Calcium: 9.1 mg/dL (ref 8.9–10.3)
Chloride: 100 mmol/L (ref 98–111)
Creatinine, Ser: 1.01 mg/dL (ref 0.61–1.24)
GFR, Estimated: >60 mL/min (ref >60)
Glucose, Bld: 119 mg/dL — ABNORMAL HIGH (ref 70–99)
Potassium: 3.9 mmol/L (ref 3.5–5.1)
Sodium: 138 mmol/L (ref 135–145)
Total Bilirubin: 0.7 mg/dL (ref 0.3–1.2)
Total Protein: 7.9 g/dL (ref 6.5–8.1)

## 2020-04-09 NOTE — Progress Notes (Signed)
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  Pt had lymph node biopsy to left side of neck yesterday.
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
## 2020-04-09 NOTE — Assessment & Plan Note (Addendum)
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  PET scan ordered for further staging.  Discussed with patient that if locally advanced [PET/Final staging still pending] I reviewed the options with the patient- of definitive chemotherapy and radiation therapy concurrently. Also discussed that usually, use of salvage surgery is for recurrent malignancy. I discussed that the treatments are fairly intensive; but the goal would be cure. Usually the treatments last 6-8 weeks; and I would recommend generally weekly cisplatin/ along with radiation Monday through Friday.  Given the bulky disease induction chemotherapy was discussed however given the absence of any improvement of overall survival compared to chemoradiation-I think it is reasonable to proceed with concurrent chemoradiation.  Discussed regarding oral mucositis; difficulty swallowing; weight loss; and at times need for PEG tube placement. As patient has not lost any weight; I do not think patient would need prophylactic PEG tube. With regards to IV access-recommend port placement.  Discussed the potential side effects including but not limited to-increasing fatigue, nausea vomiting, diarrhea, hair loss, sores in the mouth, increase risk of infection and also neuropathy; renal failure and hearing loss.   Thank you Dr.Bennett for allowing me to participate in the care of your pleasant patient. Please do not hesitate to contact me with questions or concerns in the interim. Discussed with Dr.Bennett.   # DISPOSITION: # PET scan ASAP # referral to Dr.Crystal - ASAP # chemo education- cisplatin weekly-RT # social work re: Ship broker # labs today- cbc/cmp # referral to IR port placement ASAP # Nutrition evaluation- re: head and neck cancer # follow up- TBD-Dr.B

## 2020-04-11 DIAGNOSIS — Z7189 Other specified counseling: Secondary | ICD-10-CM | POA: Insufficient documentation

## 2020-04-12 ENCOUNTER — Telehealth: Payer: Self-pay

## 2020-04-12 ENCOUNTER — Other Ambulatory Visit: Payer: Self-pay | Admitting: Internal Medicine

## 2020-04-12 ENCOUNTER — Encounter: Payer: Self-pay | Admitting: Internal Medicine

## 2020-04-12 LAB — SURGICAL PATHOLOGY

## 2020-04-12 NOTE — Telephone Encounter (Signed)
IR port placement set for 04/16/20 at 8:30 for 9:30.   I spoke with patient to make him aware. He is aware an IR nurse will contact him in regards to more information prior to procedure.    He is also aware of appointment scheduled on 12/21 for port lab/md/cisplatin tx. Will keep him updated on PET scan approval.

## 2020-04-12 NOTE — Addendum Note (Signed)
Addended by: Delice Bison E on: 04/12/2020 10:10 AM   Modules accepted: Orders

## 2020-04-13 ENCOUNTER — Encounter: Payer: Self-pay | Admitting: Internal Medicine

## 2020-04-13 NOTE — Progress Notes (Signed)
Patient on schedule for [facility] Placement 12/10, called and spoke with patient on phone. Made aware to be here @ 0830, NPO after MN prior to procedure, hold am dose Metformin,and driver for discharge post procedure/recovery, stated understanding.

## 2020-04-14 ENCOUNTER — Encounter: Payer: Self-pay | Admitting: Radiation Oncology

## 2020-04-15 ENCOUNTER — Other Ambulatory Visit: Payer: Self-pay | Admitting: Student

## 2020-04-15 ENCOUNTER — Other Ambulatory Visit: Payer: Self-pay

## 2020-04-15 ENCOUNTER — Other Ambulatory Visit: Payer: Self-pay | Admitting: Radiology

## 2020-04-15 ENCOUNTER — Ambulatory Visit
Admission: RE | Admit: 2020-04-15 | Discharge: 2020-04-15 | Disposition: A | Discharge status: Home / Self Care | Payer: 59 | Source: Ambulatory Visit | Attending: Radiation Oncology | Admitting: Radiation Oncology

## 2020-04-15 ENCOUNTER — Encounter: Payer: Self-pay | Admitting: Radiation Oncology

## 2020-04-15 VITALS — BP 176/86 | HR 91 | Temp 98.6°F | Wt 341.0 lb

## 2020-04-15 DIAGNOSIS — C01 Malignant neoplasm of base of tongue: Secondary | ICD-10-CM

## 2020-04-15 NOTE — Consult Note (Signed)
NEW PATIENT EVALUATION  Name: Joshua Bray  MRN: [id number]  Date:   04/15/2020     DOB: 10/06/1960   This 59 y.o. 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 malignant neoplasm of base of tongue    DIAGNOSIS: The encounter diagnosis was Malignant neoplasm of base of tongue ([place]).   PREVIOUS INVESTIGATIONS:  CT scan reviewed PET CT scan has been ordered Pathology report reviewed Clinical notes reviewed  HPI: Patient is a 59 year old male who is noticed some dysphagia and enlargement of lymph nodes over the past 2 years in his neck.  He had lost his insurance did not seek follow-up has had some issues with his teeth thought this might be due to infection.  Initially had left-sided neck cyst which was aspirated by ENT and then lost to follow-up.  He now presents with a large heterogeneous enhancing mass of the tongue measuring up to 4.5 cm he also has massively enlarged and partially necrotic bilateral metastatic cervical nodes.  Core biopsy of 1 of these nodes showed p16 positive squamous cell carcinoma.  Patient has been able to swallow although with some difficulty.  He has a PET/CT CT scan ordered for next week.  He has been seen by medical oncology and is now referred to radiation collagen for consideration of concurrent chemoradiation.  Patient is morbidly obese and his weight has been stable.  PLANNED TREATMENT REGIMEN: Concurrent chemoradiation  PAST MEDICAL HISTORY:  has a past medical history of Diabetes mellitus without complication ([place]) and Hypertension.    PAST SURGICAL HISTORY:  Past Surgical History:  Procedure Laterality Date  . FEMUR SURGERY Left 1983    FAMILY HISTORY: family  history includes Diabetes in his father; Heart disease in his father; Leukemia in his father; Thyroid disease in his mother.  SOCIAL HISTORY:  reports that he quit smoking about 30 years ago. He quit after 4.00 years of use. He has never used smokeless tobacco. He reports current alcohol use of about 5.0 standard drinks of alcohol per week. He reports previous drug use.  ALLERGIES: Penicillins  MEDICATIONS:  Current Outpatient Medications  Medication Sig Dispense Refill  . acetaminophen (TYLENOL) 500 MG tablet Take 1,000 mg by mouth 2 (two) times daily.    Marland Kitchen amLODipine (NORVASC) 10 MG tablet Take 1 tablet (10 mg total) by mouth daily. 90 tablet 1  . ezetimibe (ZETIA) 10 MG tablet Take 1 tablet (10 mg total) by mouth daily. 90 tablet 3  . hydrochlorothiazide (HYDRODIURIL) 12.5 MG tablet Take 1 tablet (12.5 mg total) by mouth daily. 90 tablet 1  . ibuprofen (ADVIL) 200 MG tablet Take 400 mg by mouth 2 (two) times daily.    . metFORMIN (GLUCOPHAGE) 500 MG tablet Take 1 tablet (500 mg total) by mouth 2 (two) times daily with a meal. 180 tablet 1  . mupirocin ointment (BACTROBAN) 2 % Apply 1 application topically 2 (two) times daily. 22 g 1  . rosuvastatin (CRESTOR) 20 MG tablet Take 1 tablet (20 mg total) by mouth daily. (Patient not taking: Reported on 04/09/2020) 90 tablet 3   No current facility-administered medications for this encounter.    ECOG PERFORMANCE STATUS:  1 - Symptomatic but completely ambulatory  REVIEW OF SYSTEMS: Patient is morbidly obese Patient denies any weight loss, fatigue, weakness, fever, chills or night sweats. Patient denies any loss of vision, blurred vision. Patient denies any ringing  of the ears or hearing loss. No irregular heartbeat. Patient denies heart murmur or history of fainting. Patient denies any chest pain or pain radiating to her upper extremities. Patient denies any shortness of breath, difficulty breathing at night, cough or hemoptysis. Patient denies  any swelling in the lower legs. Patient denies any nausea vomiting, vomiting of blood, or coffee ground material in the vomitus. Patient denies any stomach pain. Patient states has had normal bowel movements no significant constipation or diarrhea. Patient denies any dysuria, hematuria or significant nocturia. Patient denies any problems walking, swelling in the joints or loss of balance. Patient denies any skin changes, loss of hair or loss of weight. Patient denies any excessive worrying or anxiety or significant depression. Patient denies any problems with insomnia. Patient denies excessive thirst, polyuria, polydipsia. Patient denies any swollen glands, patient denies easy bruising or easy bleeding. Patient denies any recent infections, allergies or URI. Patient "s visual fields have not changed significantly in recent time.   PHYSICAL EXAM: BP (!) 176/86 (BP Location: Right Arm, Patient Position: Sitting, Cuff Size: Large)   Pulse 91   Temp 98.6 F (37 C) (Tympanic)   Wt (!) 341 lb (154.7 kg)   BMI 48.93 kg/m  Patient has met massive bilateral neck adenopathy.  Teeth are in a poor state of repair.  Well-developed well-nourished patient in NAD. HEENT reveals PERLA, EOMI, discs not visualized.  Oral cavity is clear. No oral mucosal lesions are identified. Neck is clear without evidence of cervical or supraclavicular adenopathy. Lungs are clear to A&P. Cardiac examination is essentially unremarkable with regular rate and rhythm without murmur rub or thrill. Abdomen is benign with no organomegaly or masses noted. Motor sensory and DTR levels are equal and symmetric in the upper and lower extremities. Cranial nerves II through XII are grossly intact. Proprioception is intact. No peripheral adenopathy or edema is identified. No motor or sensory levels are noted. Crude visual fields are within normal range.  LABORATORY DATA: Pathology report reviewed    RADIOLOGY RESULTS: CT scans reviewed PET CT scan  has been ordered   IMPRESSION: Stage IVb (T3 N3 MX) squamous cell carcinoma the tongue base in 59 year old male  PLAN: At this time I like to review his PET CT scan for complete staging.  Certainly it would be difficult case to cure with concurrent chemoradiation therapy although I strongly believe this would be the best avenue for initial treatment.  Would plan on delivering 73 Gray to all areas of hypermetabolic activity including tongue base as well as bilateral cervical nodes.  This will be done with concurrent chemotherapy.  Risks and benefits of treatment occluding oral mucositis fatigue alteration of taste alteration of blood count skin reaction fatigue and dysphagia all were reviewed in detail with the patient.  I have set him up for CT simulation after completion of his PET CT scan.  Repeat PET/CT scan after concurrent chemoradiation therapy showing any residual disease may necessitate salvage this resection.  Patient comprehends my recommendations well.  CT simulation was ordered.  There will be extra effort by both professional staff as well as technical staff to coordinate and manage concurrent chemoradiation and ensuing side effects during his treatments.   I would like to take this opportunity to thank you for allowing me to participate in  the care of your patient.Noreene Filbert, MD

## 2020-04-16 ENCOUNTER — Other Ambulatory Visit: Payer: Self-pay

## 2020-04-16 ENCOUNTER — Ambulatory Visit
Admission: RE | Admit: 2020-04-16 | Discharge: 2020-04-16 | Disposition: A | Discharge status: Home / Self Care | Payer: 59 | Source: Ambulatory Visit | Attending: Internal Medicine | Admitting: Internal Medicine

## 2020-04-16 DIAGNOSIS — Z87891 Personal history of nicotine dependence: Secondary | ICD-10-CM | POA: Insufficient documentation

## 2020-04-16 DIAGNOSIS — Z880 Allergy status to penicillin: Secondary | ICD-10-CM | POA: Diagnosis not present

## 2020-04-16 DIAGNOSIS — C01 Malignant neoplasm of base of tongue: Secondary | ICD-10-CM | POA: Diagnosis present

## 2020-04-16 DIAGNOSIS — Z7984 Long term (current) use of oral hypoglycemic drugs: Secondary | ICD-10-CM | POA: Insufficient documentation

## 2020-04-16 DIAGNOSIS — Z79899 Other long term (current) drug therapy: Secondary | ICD-10-CM | POA: Diagnosis not present

## 2020-04-16 HISTORY — PX: IR IMAGING GUIDED PORT INSERTION: IMG5740

## 2020-04-16 LAB — GLUCOSE, CAPILLARY: Glucose-Capillary: 170 mg/dL — ABNORMAL HIGH (ref 70–99)

## 2020-04-16 MED ORDER — HEPARIN SOD (PORK) LOCK FLUSH 100 UNIT/ML IV SOLN
INTRAVENOUS | Status: AC
  Filled 2020-04-16: qty 5

## 2020-04-16 MED ORDER — SODIUM CHLORIDE 0.9 % IV SOLN: INTRAVENOUS | Status: DC

## 2020-04-16 MED ORDER — MIDAZOLAM HCL 2 MG/2ML IJ SOLN
INTRAMUSCULAR | Status: AC | PRN
  Administered 2020-04-16 (×2): 1 mg via INTRAVENOUS

## 2020-04-16 MED ORDER — CEFAZOLIN SODIUM-DEXTROSE 1-4 GM/50ML-% IV SOLN
INTRAVENOUS | Status: AC
  Filled 2020-04-16: qty 50

## 2020-04-16 MED ORDER — VANCOMYCIN HCL 500 MG/100ML IV SOLN
INTRAVENOUS | Status: AC | PRN
  Administered 2020-04-16: 1000 mg via INTRAVENOUS

## 2020-04-16 MED ORDER — VANCOMYCIN HCL IN DEXTROSE 1-5 GM/200ML-% IV SOLN: 1000.0000 mg | INTRAVENOUS | Status: DC

## 2020-04-16 MED ORDER — MIDAZOLAM HCL 2 MG/2ML IJ SOLN
INTRAMUSCULAR | Status: AC
  Filled 2020-04-16: qty 2

## 2020-04-16 MED ORDER — FENTANYL CITRATE (PF) 100 MCG/2ML IJ SOLN
INTRAMUSCULAR | Status: AC
  Filled 2020-04-16: qty 2

## 2020-04-16 MED ORDER — FENTANYL CITRATE (PF) 100 MCG/2ML IJ SOLN
INTRAMUSCULAR | Status: AC | PRN
  Administered 2020-04-16 (×2): 50 ug via INTRAVENOUS

## 2020-04-16 MED ORDER — VANCOMYCIN HCL IN DEXTROSE 1-5 GM/200ML-% IV SOLN
INTRAVENOUS | Status: AC
  Filled 2020-04-16: qty 200

## 2020-04-16 NOTE — Progress Notes (Signed)
Patient clinically stable post Port Placement per Dr Serafina Royals, tolerated well. Vitals stable pre and post procedure. Awake/alert and oriented post procedure. Received Versed 2 mg along with Fentanyl 100 mcg IV for procedure. Denies complaints at this time. Report given to Fransico Michael Rn in specials post procedure.

## 2020-04-16 NOTE — H&P (Signed)
Chief Complaint: Patient was seen in consultation today for port-a-catheter placement  Referring Physician(s): Cammie Sickle  Supervising Physician: Dr. Serafina Royals  Patient Status: [facility] - Out-pt  History of Present Illness: Joshua Bray is a 59 y.o. 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 He met with an ENT provider in [place] around that time and was encouraged to have it removed but he unfortunately lost his health insurance and did not follow up.   He presented to his PCP early November 2021 with complaints of bilateral neck swelling and difficulty swallowing. Imaging showed a mass at the base of his tongue and a biopsy returned positive for squamous cell carcinoma.   Interventional Radiology has been asked to evaluate this patient for an image-guided port-a-catheter placement to facilitate his treatment plans.   Past Medical History:  Diagnosis Date  . Diabetes mellitus without complication ([place])   . Hypertension     Past Surgical History:  Procedure Laterality Date  . FEMUR SURGERY Left 1983    Allergies: Penicillins  Medications: Prior to Admission medications   Medication Sig Start Date End Date Taking? Authorizing Provider  acetaminophen (TYLENOL) 500 MG tablet Take 1,000 mg by mouth 2 (two) times daily.    [provider]  amLODipine (NORVASC) 10 MG tablet Take 1 tablet (10 mg total) by mouth daily. 03/10/20   Malfi, Lupita Raider, FNP  ezetimibe (ZETIA) 10 MG tablet Take 1 tablet (10 mg total) by mouth daily. 03/11/20   Malfi, Lupita Raider, FNP  hydrochlorothiazide (HYDRODIURIL) 12.5 MG tablet Take 1 tablet (12.5 mg total) by mouth daily. 03/10/20   Malfi, Lupita Raider, FNP  ibuprofen (ADVIL) 200 MG tablet Take 400 mg by mouth 2 (two) times daily.    [provider]  metFORMIN (GLUCOPHAGE) 500 MG tablet Take 1 tablet (500 mg  total) by mouth 2 (two) times daily with a meal. 03/10/20   Malfi, Lupita Raider, FNP  mupirocin ointment (BACTROBAN) 2 % Apply 1 application topically 2 (two) times daily. 03/10/20   Malfi, Lupita Raider, FNP  rosuvastatin (CRESTOR) 20 MG tablet Take 1 tablet (20 mg total) by mouth daily. Patient not taking: Reported on 04/09/2020 03/11/20   Verl Bangs, FNP     Family History  Problem Relation Age of Onset  . Thyroid disease Mother   . Leukemia Father   . Heart disease Father   . Diabetes Father     Social History   Socioeconomic History  . Marital status: Married    Spouse name: Not on file  . Number of children: Not on file  . Years of education: Not on file  . Highest education level: Not on file  Occupational History  . Not on file  Tobacco Use  . Smoking status: Former Smoker    Years: 4.00    Quit date: 03/10/1990    Years since quitting: 30.1  . Smokeless tobacco: Never Used  Vaping Use  . Vaping Use: Never used  Substance and Sexual Activity  . Alcohol use: Yes    Alcohol/week: 5.0 standard drinks    Types: 5 Cans of beer per week  . Drug use: Not Currently  . Sexual activity: Not on file  Other Topics Concern  . Not on file  Social History Narrative   Engineer, agricultural; quit smoking 1992; alcohol/beerweekly. Lives in [place] with wife.    Social  Determinants of Health   Financial Resource Strain: Not on file  Food Insecurity: Not on file  Transportation Needs: Not on file  Physical Activity: Not on file  Stress: Not on file  Social Connections: Not on file    Review of Systems: A 12 point ROS discussed and pertinent positives are indicated in the HPI above.  All other systems are negative.  Review of Systems  Constitutional: Negative for appetite change and fatigue.  HENT: Positive for facial swelling, trouble swallowing and voice change.   Respiratory: Negative for cough and shortness of breath.   Cardiovascular: Positive for leg swelling.  Negative for chest pain.  Gastrointestinal: Negative for abdominal pain, nausea and vomiting.  Musculoskeletal: Positive for neck pain. Negative for back pain.  Neurological: Negative for dizziness, light-headedness and headaches.  Hematological: Does not bruise/bleed easily.    Vital Signs: BP (!) 201/97   Pulse 90   Temp 97.7 F (36.5 C) (Oral)   Resp 20   Ht 5' 11"  (1.803 m)   Wt (!) 341 lb (154.7 kg)   SpO2 98%   BMI 47.56 kg/m   Physical Exam Constitutional:      General: He is not in acute distress.    Appearance: He is obese.  HENT:     Head:     Comments: Large left neck mass, firm but non-tender.     Mouth/Throat:     Mouth: Mucous membranes are moist.     Pharynx: Oropharynx is clear.  Cardiovascular:     Rate and Rhythm: Normal rate and regular rhythm.     Pulses: Normal pulses.     Heart sounds: Normal heart sounds.  Pulmonary:     Effort: Pulmonary effort is normal.     Breath sounds: Normal breath sounds.  Abdominal:     General: Abdomen is protuberant. Bowel sounds are normal.     Palpations: Abdomen is soft.  Musculoskeletal:     Right lower leg: Edema present.     Left lower leg: Edema present.  Skin:    General: Skin is warm and dry.     Comments: Edema, dry skin and brown discoloration to bilateral legs.   Neurological:     Mental Status: He is alert and oriented to person, place, and time.  Psychiatric:        Mood and Affect: Mood normal.        Behavior: Behavior normal.        Thought Content: Thought content normal.        Judgment: Judgment normal.     Imaging: CT SOFT TISSUE NECK W CONTRAST  Result Date: 04/07/2020 CLINICAL DATA:  Submandibular swelling with dysphagia. EXAM: CT NECK WITH CONTRAST TECHNIQUE: Multidetector CT imaging of the neck was performed using the standard protocol following the bolus administration of intravenous contrast. CONTRAST:  39m OMNIPAQUE IOHEXOL 300 MG/ML  SOLN COMPARISON:  None. FINDINGS: PHARYNX AND  LARYNX: There is a large, heterogeneously enhancing mass of the tongue base that measures 3.0 x 4.5 x 3.8 cm. The mass invades the right genioglossus and hyoglossus and the left genioglossus. There is extension into the submandibular space but no extension below the digastric muscle. Posteriorly, the mass extends into the vallecula. SALIVARY GLANDS: The submandibular glands are displaced anteriorly by large lymph nodes but appear normal themselves. Parotid glands are normal. Sublingual glands are difficult to visualize. THYROID: Normal. LYMPH NODES: There are numerous massively enlarged and partially necrotic bilateral lymph nodes. At left level 2A  5.3 cm (series 2, image 65). At right level 2A 3.6 cm (image 57). At right level 3, 2.9 cm (series 6, image 56). VASCULAR: Major cervical vessels are patent. LIMITED INTRACRANIAL: Normal. VISUALIZED ORBITS: Normal. MASTOIDS AND VISUALIZED PARANASAL SINUSES: No fluid levels or advanced mucosal thickening. No mastoid effusion. SKELETON: No bony spinal canal stenosis. No lytic or blastic lesions. UPPER CHEST: Clear. OTHER: None. IMPRESSION: 1. Large, heterogeneously enhancing mass of the tongue base, measuring up to 4.5 cm. 2. Massively enlarged and partially necrotic bilateral metastatic cervical lymph nodes. Electronically Signed   By: Ulyses Jarred M.D.   On: 04/07/2020 19:29   US Soft Tissue Head/Neck (NON-THYROID)  Result Date: 03/17/2020 CLINICAL DATA:  59 year old male with left-sided neck mass EXAM: ULTRASOUND OF HEAD/NECK SOFT TISSUES TECHNIQUE: Ultrasound examination of the head and neck soft tissues was performed in the area of clinical concern. COMPARISON:  None. FINDINGS: Sonographic evaluation of the right neck demonstrates a large hypoechoic and relatively homogeneous soft tissue mass measuring 7.2 x 3.7 x 4.1 cm. Internal vascularity is present on color Doppler imaging. There are also some internal cystic spaces consistent with cystic degeneration. Two  additional soft tissue masses are also identified measuring 2.5 x 2.5 x 1.7 cm and 3.4 x 3.2 x 2.6 cm respectively. Evaluation of left neck demonstrates a similar appearing large circumscribed hypoechoic soft tissue mass measuring 7.3 x 5.1 x 4.9 cm. Internal color flow is visible on color Doppler imaging. IMPRESSION: 1. Marked bilateral pathologic cervical lymphadenopathy. Differential considerations include lymphoma and, less likely, metastatic adenopathy. Recommend further evaluation with PET-CT and/or tissue sampling. Electronically Signed   By: Jacqulynn Cadet M.D.   On: 03/17/2020 15:45   [place] CORE BIOPSY (LYMPH NODES)  Result Date: 04/08/2020 INDICATION: Multiple enlarged bilateral cervical lymph nodes and mass involving the base of the tongue. EXAM: ULTRASOUND GUIDED CORE BIOPSY OF LEFT CERVICAL LYMPH NODE MASS MEDICATIONS: None. ANESTHESIA/SEDATION: No sedation was administered. PROCEDURE: The procedure, risks, benefits, and alternatives were explained to the patient. Questions regarding the procedure were encouraged and answered. The patient understands and consents to the procedure. A time-out was performed prior to initiating the procedure. Ultrasound was performed of cervical lymph nodes on both sides of the neck. The left neck was prepped with chlorhexidine in a sterile fashion, and a sterile drape was applied covering the operative field. A sterile gown and sterile gloves were used for the procedure. Local anesthesia was provided with 1% Lidocaine. An 18 gauge core biopsy device was utilized in obtaining 6 separate core biopsy samples in different portions of an enlarged left cervical lymph node. Core biopsy samples were submitted in formalin and on saline soaked Telfa gauze. Additional ultrasound was performed after biopsy. COMPLICATIONS: None immediate. FINDINGS: Multiple enlarged lymph nodes are noted in the neck bilaterally. The largest lymph node on the right measures 4.8 cm in greatest  length. The dominant lymph node mass on the left measures 7.5 x 5.3 x 7.2 cm. All of the enlarged lymph nodes are solid and abnormal by ultrasound. Solid tissue was obtained from the dominant enlarged left cervical lymph node. IMPRESSION: Ultrasound-guided core biopsy performed of an enlarged left cervical lymph node measuring up to 7.5 cm in greatest diameter. Electronically Signed   By: Aletta Edouard M.D.   On: 04/08/2020 12:15    Labs:  CBC: Recent Labs    03/10/20 1133 04/09/20 1559  WBC 5.2 6.7  HGB 17.0 16.8  HCT 48.9 47.5  PLT 195 212  COAGS: No results for input(s): INR, APTT in the last 8760 hours.  BMP: Recent Labs    03/10/20 1133 04/09/20 1559  NA 139 138  K 4.2 3.9  CL 103 100  CO2 26 26  GLUCOSE 166* 119*  BUN 13 21*  CALCIUM 9.3 9.1  CREATININE 1.08 1.01  GFRNONAA 75 >60  GFRAA 87  --     LIVER FUNCTION TESTS: Recent Labs    03/10/20 1133 04/09/20 1559  BILITOT 0.6 0.7  AST 21 31  ALT 23 36  ALKPHOS  --  77  PROT 6.9 7.9  ALBUMIN  --  4.3    TUMOR MARKERS: No results for input(s): AFPTM, CEA, CA199, CHROMGRNA in the last 8760 hours.  Assessment and Plan:  Squamous cell carcinoma of base of tongue: Lars Mage, 59 year old male, presents today to the [facility] Interventional Radiology department for an image-guided port-a-catheter placement.  Risks and benefits of image-guided Port-a-catheter placement were discussed with the patient including, but not limited to bleeding, infection, pneumothorax, or fibrin sheath development and need for additional procedures.  All of the patient's questions were answered, patient is agreeable to proceed.  Consent signed and in chart.  Thank you for this interesting consult.  I greatly enjoyed meeting Luisfelipe Engelstad and look forward to participating in their care.  A copy of this report was sent to the requesting provider on this date.  Electronically Signed: Soyla Dryer,  AGACNP-BC [phone] 04/16/2020, 9:43 AM   I spent a total of  30 Minutes   in face to face in clinical consultation, greater than 50% of which was counseling/coordinating care for port-a-catheter placement.

## 2020-04-16 NOTE — Patient Instructions (Signed)

## 2020-04-16 NOTE — Procedures (Signed)
Interventional Radiology Procedure Note ° °Procedure: Single Lumen Power Port Placement   ° °Access:  Right internal jugular vein ° °Findings: Catheter tip positioned at cavoatrial junction. Port is ready for immediate use.  ° °Complications: None ° °EBL: < 10 mL ° °Recommendations:  °- Ok to shower in 24 hours °- Do not submerge for 7 days °- Routine line care  ° ° °Sarie Stall, MD °Pager: [phone] ° ° ° °

## 2020-04-19 ENCOUNTER — Other Ambulatory Visit: Payer: Self-pay | Admitting: Internal Medicine

## 2020-04-19 ENCOUNTER — Inpatient Hospital Stay: Payer: 59

## 2020-04-19 NOTE — Progress Notes (Signed)
START ON PATHWAY REGIMEN - Head and Neck     A cycle is every 7 days:     Cisplatin   **Always confirm dose/schedule in your pharmacy ordering system**  Patient Characteristics: Oropharynx, HPV Positive, Preoperative or Nonsurgical Candidate (Clinical Staging), cT0-4, cN1-3 or cT3-4, cN0 Disease Classification: Oropharynx HPV Status: Positive (+) Therapeutic Status: Preoperative or Nonsurgical Candidate (Clinical Staging) AJCC T Category: cT3 AJCC 8 Stage Grouping: II AJCC N Category: cN2 AJCC M Category: cM0 Intent of Therapy: Curative Intent, Discussed with Patient 

## 2020-04-20 NOTE — Progress Notes (Signed)
Pharmacist Chemotherapy Monitoring - Initial Assessment    Anticipated start date: 04/27/20  Regimen:  . Are orders appropriate based on the patient's diagnosis, regimen, and cycle? Yes . Does the plan date match the patient's scheduled date? Yes . Is the sequencing of drugs appropriate? Yes . Are the premedications appropriate for the patient's regimen? Yes . Prior Authorization for treatment is: Pending o If applicable, is the correct biosimilar selected based on the patient's insurance? not applicable  Organ Function and Labs: Marland Kitchen Are dose adjustments needed based on the patient's renal function, hepatic function, or hematologic function? No . Are appropriate labs ordered prior to the start of patient's treatment? Yes . Other organ system assessment, if indicated: cisplatin: baseline audiogram . The following baseline labs, if indicated, have been ordered: cisplatin: K, Mg  Dose Assessment: . Are the drug doses appropriate? Yes . Are the following correct: o Drug concentrations Yes o IV fluid compatible with drug Yes o Administration routes Yes o Timing of therapy Yes . If applicable, does the patient have documented access for treatment and/or plans for port-a-cath placement? yes . If applicable, have lifetime cumulative doses been properly documented and assessed? yes Lifetime Dose Tracking  No doses have been documented on this patient for the following tracked chemicals: Doxorubicin, Epirubicin, Idarubicin, Daunorubicin, Mitoxantrone, Bleomycin, Oxaliplatin, Carboplatin, Liposomal Doxorubicin  o   Toxicity Monitoring/Prevention: . The patient has the following take home antiemetics prescribed: Ondansetron, Prochlorperazine, Dexamethasone and Lorazepam . The patient has the following take home medications prescribed: N/A . Medication allergies and previous infusion related reactions, if applicable, have been reviewed and addressed. Yes . The patient's current medication list  has been assessed for drug-drug interactions with their chemotherapy regimen. no significant drug-drug interactions were identified on review.  Order Review: . Are the treatment plan orders signed? No . Is the patient scheduled to see a provider prior to their treatment? Yes  I verify that I have reviewed each item in the above checklist and answered each question accordingly.  Joshua Bray 04/20/2020 1:37 PM

## 2020-04-21 ENCOUNTER — Other Ambulatory Visit: Payer: Self-pay

## 2020-04-21 ENCOUNTER — Ambulatory Visit
Admission: RE | Admit: 2020-04-21 | Discharge: 2020-04-21 | Disposition: A | Discharge status: Home / Self Care | Payer: 59 | Source: Ambulatory Visit | Attending: Internal Medicine | Admitting: Internal Medicine

## 2020-04-21 DIAGNOSIS — R590 Localized enlarged lymph nodes: Secondary | ICD-10-CM | POA: Insufficient documentation

## 2020-04-21 DIAGNOSIS — C01 Malignant neoplasm of base of tongue: Secondary | ICD-10-CM | POA: Diagnosis not present

## 2020-04-21 LAB — GLUCOSE, CAPILLARY: Glucose-Capillary: 122 mg/dL — ABNORMAL HIGH (ref 70–99)

## 2020-04-21 MED ORDER — FLUDEOXYGLUCOSE F - 18 (FDG) INJECTION
15.5680 | Freq: Once | INTRAVENOUS | Status: AC | PRN
  Administered 2020-04-21: 14:00:00 15.568 via INTRAVENOUS

## 2020-04-22 ENCOUNTER — Ambulatory Visit
Admission: RE | Admit: 2020-04-22 | Discharge: 2020-04-22 | Disposition: A | Discharge status: Home / Self Care | Payer: 59 | Source: Ambulatory Visit | Attending: Radiation Oncology | Admitting: Radiation Oncology

## 2020-04-22 ENCOUNTER — Telehealth: Payer: Self-pay | Admitting: *Deleted

## 2020-04-22 DIAGNOSIS — Z510 Encounter for antineoplastic radiation therapy: Secondary | ICD-10-CM | POA: Insufficient documentation

## 2020-04-22 DIAGNOSIS — C01 Malignant neoplasm of base of tongue: Secondary | ICD-10-CM | POA: Diagnosis not present

## 2020-04-22 NOTE — Telephone Encounter (Signed)
Patient called asking if his chemotherapy appointment needs to be changed to coincide with the start of his radiation therapy. Please advise

## 2020-04-23 DIAGNOSIS — Z510 Encounter for antineoplastic radiation therapy: Secondary | ICD-10-CM | POA: Diagnosis not present

## 2020-04-23 NOTE — Telephone Encounter (Signed)
C- please move all pt's appts to 12/28- with X-MD- to coincide with Radiation.   Please inform pt that I will plan to reach him later in the afternoon.   Thanks, GB

## 2020-04-26 ENCOUNTER — Telehealth: Payer: Self-pay | Admitting: Internal Medicine

## 2020-04-26 ENCOUNTER — Other Ambulatory Visit: Payer: Self-pay | Admitting: *Deleted

## 2020-04-26 ENCOUNTER — Encounter: Payer: Self-pay | Admitting: Internal Medicine

## 2020-04-26 DIAGNOSIS — C01 Malignant neoplasm of base of tongue: Secondary | ICD-10-CM

## 2020-04-26 DIAGNOSIS — R110 Nausea: Secondary | ICD-10-CM

## 2020-04-26 MED ORDER — PROCHLORPERAZINE MALEATE 10 MG PO TABS: 10.0000 mg | ORAL_TABLET | Freq: Four times a day (QID) | ORAL | 0 refills | Status: DC | PRN

## 2020-04-26 MED ORDER — ONDANSETRON HCL 8 MG PO TABS: 8.0000 mg | ORAL_TABLET | Freq: Three times a day (TID) | ORAL | 0 refills | Status: DC | PRN

## 2020-04-26 MED ORDER — LIDOCAINE-PRILOCAINE 2.5-2.5 % EX CREA: 1.0000 "application " | TOPICAL_CREAM | CUTANEOUS | 0 refills | Status: DC | PRN

## 2020-04-26 NOTE — Telephone Encounter (Signed)
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  Recommend chemoradiation as planned.  However we will move chemotherapy by 1 week/to coincide with radiation schedule.  Patient agreement with the plan.

## 2020-04-27 ENCOUNTER — Inpatient Hospital Stay: Payer: 59

## 2020-04-27 ENCOUNTER — Inpatient Hospital Stay: Payer: 59 | Admitting: Internal Medicine

## 2020-05-02 NOTE — Progress Notes (Signed)
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  He currently feels well and is asymptomatic.  He has no neurologic complaints.  He denies any recent fevers or illnesses.  He has a fair appetite, but denies weight loss.  He has mild dysphagia which is well controlled with Tylenol and/or Advil.  He has no chest pain, shortness of breath, cough, or hemoptysis.  He denies any nausea, vomiting, constipation, or diarrhea.  He has no urinary complaints.  Patient otherwise feels well and offers no further specific complaints today.  REVIEW OF SYSTEMS:   Review of Systems  Constitutional: Negative.  Negative for fever, malaise/fatigue and weight loss.  Respiratory: Negative.  Negative for cough, hemoptysis and shortness of breath.   Cardiovascular: Negative.  Negative for chest pain and leg swelling.  Gastrointestinal: Negative.  Negative for abdominal pain and heartburn.  Genitourinary: Negative.  Negative for dysuria.  Musculoskeletal: Negative.  Negative for back pain.  Skin: Negative.  Negative for rash.  Neurological: Negative.  Negative for dizziness, focal weakness, weakness and headaches.  Psychiatric/Behavioral: Negative.  The patient is not nervous/anxious.     As per HPI. Otherwise, a complete review of systems is negative.  PAST MEDICAL HISTORY: Past Medical History:  Diagnosis Date  . Diabetes mellitus without complication ([place])   . Hypertension     PAST SURGICAL HISTORY: Past Surgical  History:  Procedure Laterality Date  . FEMUR SURGERY Left 1983  . IR IMAGING GUIDED PORT INSERTION  04/16/2020    FAMILY HISTORY: Family History  Problem Relation Age of Onset  . Thyroid disease Mother   . Leukemia Father   . Heart disease Father   . Diabetes Father     ADVANCED DIRECTIVES (Y/N):  N  HEALTH MAINTENANCE: Social History   Tobacco Use  . Smoking status: Former Smoker    Years: 4.00    Quit date: 03/10/1990    Years since quitting: 30.1  . Smokeless tobacco: Never Used  Vaping Use  . Vaping Use: Never used  Substance Use Topics  . Alcohol use: Yes    Alcohol/week: 5.0 standard drinks    Types: 5 Cans of beer per week  . Drug use: Not Currently     Colonoscopy:  PAP:  Bone density:  Lipid panel:  Allergies  Allergen Reactions  . Penicillins     Unknown, allergy from an infant    Current Outpatient Medications  Medication Sig Dispense Refill  . acetaminophen (TYLENOL) 500 MG tablet Take 1,000 mg by mouth 2 (two) times daily.    Marland Kitchen amLODipine (NORVASC) 10 MG tablet Take 1 tablet (10 mg total) by mouth daily. 90 tablet 1  . ibuprofen (ADVIL) 200 MG tablet Take 400 mg by mouth 2 (two) times daily.    . metFORMIN (GLUCOPHAGE) 500 MG tablet Take 1 tablet (500 mg total) by mouth 2 (two) times daily with a meal. 180 tablet 1  . ezetimibe (ZETIA) 10 MG tablet Take 1 tablet (10 mg total) by mouth daily. (Patient not taking: Reported on 05/04/2020) 90 tablet 3  . hydrochlorothiazide (HYDRODIURIL) 12.5 MG  tablet Take 1 tablet (12.5 mg total) by mouth daily. (Patient not taking: Reported on 05/04/2020) 90 tablet 1  . lidocaine-prilocaine (EMLA) cream Apply 1 application topically as needed. (Patient not taking: Reported on 05/04/2020) 30 g 0  . mupirocin ointment (BACTROBAN) 2 % Apply 1 application topically 2 (two) times daily. (Patient not taking: Reported on 05/04/2020) 22 g 1  . ondansetron (ZOFRAN) 8 MG tablet Take 1 tablet (8 mg total) by mouth every 8  (eight) hours as needed for nausea or vomiting. (Patient not taking: Reported on 05/04/2020) 20 tablet 0  . prochlorperazine (COMPAZINE) 10 MG tablet Take 1 tablet (10 mg total) by mouth every 6 (six) hours as needed for nausea or vomiting. (Patient not taking: Reported on 05/04/2020) 30 tablet 0  . rosuvastatin (CRESTOR) 20 MG tablet Take 1 tablet (20 mg total) by mouth daily. (Patient not taking: Reported on 05/04/2020) 90 tablet 3   No current facility-administered medications for this visit.   Facility-Administered Medications Ordered in Other Visits  Medication Dose Route Frequency Provider Last Rate Last Admin  . 0.9 % NaCl with KCl 20 mEq/ L  infusion   Intravenous Once Louretta ShortenBrahmanday, Govinda R, MD      . CISplatin (PLATINOL) 111 mg in sodium chloride 0.9 % 500 mL chemo infusion  40 mg/m2 (Treatment Plan Recorded) Intravenous Once Jeralyn RuthsFinnegan, Shalimar Mcclain J, MD      . dexamethasone (DECADRON) 10 mg in sodium chloride 0.9 % 50 mL IVPB  10 mg Intravenous Once Jeralyn RuthsFinnegan, Sundeep Cary J, MD      . fosaprepitant (EMEND) 150 mg in sodium chloride 0.9 % 145 mL IVPB  150 mg Intravenous Once Jeralyn RuthsFinnegan, Chablis Losh J, MD      . heparin lock flush 100 unit/mL  500 Units Intracatheter Once PRN Jeralyn RuthsFinnegan, Murlene Revell J, MD      . magnesium sulfate IVPB 2 g 50 mL  2 g Intravenous Once Louretta ShortenBrahmanday, Govinda R, MD      . palonosetron (ALOXI) injection 0.25 mg  0.25 mg Intravenous Once Jeralyn RuthsFinnegan, Israella Hubert J, MD        OBJECTIVE: Vitals:   05/04/20 0923  BP: (!) 167/93  Pulse: 81  Resp: 18  Temp: 98.3 F (36.8 C)  SpO2: 97%     Body mass index is 47.89 kg/m.    ECOG FS:0 - Asymptomatic  General: Well-developed, well-nourished, no acute distress. Eyes: Pink conjunctiva, anicteric sclera. HEENT: Normocephalic, moist mucous membranes.  Easily palpable bilateral cervical lymphadenopathy. Lungs: No audible wheezing or coughing. Heart: Regular rate and rhythm. Abdomen: Soft, nontender, no obvious distention. Musculoskeletal: No  edema, cyanosis, or clubbing. Neuro: Alert, answering all questions appropriately. Cranial nerves grossly intact. Skin: No rashes or petechiae noted. Psych: Normal affect.  LAB RESULTS:  Lab Results  Component Value Date   NA 136 05/04/2020   K 3.9 05/04/2020   CL 102 05/04/2020   CO2 26 05/04/2020   GLUCOSE 159 (H) 05/04/2020   BUN 20 05/04/2020   CREATININE 0.94 05/04/2020   CALCIUM 8.8 (L) 05/04/2020   PROT 7.9 04/09/2020   ALBUMIN 4.3 04/09/2020   AST 31 04/09/2020   ALT 36 04/09/2020   ALKPHOS 77 04/09/2020   BILITOT 0.7 04/09/2020   GFRNONAA >60 05/04/2020   GFRAA 87 03/10/2020    Lab Results  Component Value Date   WBC 5.5 05/04/2020   NEUTROABS 2.7 05/04/2020   HGB 15.2 05/04/2020   HCT 44.2 05/04/2020   MCV 91.7 05/04/2020   PLT 183 05/04/2020  STUDIES: CT SOFT TISSUE NECK W CONTRAST  Result Date: 04/07/2020 CLINICAL DATA:  Submandibular swelling with dysphagia. EXAM: CT NECK WITH CONTRAST TECHNIQUE: Multidetector CT imaging of the neck was performed using the standard protocol following the bolus administration of intravenous contrast. CONTRAST:  79mL OMNIPAQUE IOHEXOL 300 MG/ML  SOLN COMPARISON:  None. FINDINGS: PHARYNX AND LARYNX: There is a large, heterogeneously enhancing mass of the tongue base that measures 3.0 x 4.5 x 3.8 cm. The mass invades the right genioglossus and hyoglossus and the left genioglossus. There is extension into the submandibular space but no extension below the digastric muscle. Posteriorly, the mass extends into the vallecula. SALIVARY GLANDS: The submandibular glands are displaced anteriorly by large lymph nodes but appear normal themselves. Parotid glands are normal. Sublingual glands are difficult to visualize. THYROID: Normal. LYMPH NODES: There are numerous massively enlarged and partially necrotic bilateral lymph nodes. At left level 2A 5.3 cm (series 2, image 65). At right level 2A 3.6 cm (image 57). At right level 3, 2.9 cm  (series 6, image 56). VASCULAR: Major cervical vessels are patent. LIMITED INTRACRANIAL: Normal. VISUALIZED ORBITS: Normal. MASTOIDS AND VISUALIZED PARANASAL SINUSES: No fluid levels or advanced mucosal thickening. No mastoid effusion. SKELETON: No bony spinal canal stenosis. No lytic or blastic lesions. UPPER CHEST: Clear. OTHER: None. IMPRESSION: 1. Large, heterogeneously enhancing mass of the tongue base, measuring up to 4.5 cm. 2. Massively enlarged and partially necrotic bilateral metastatic cervical lymph nodes. Electronically Signed   By: Ulyses Jarred M.D.   On: 04/07/2020 19:29   NM PET Image Initial (PI) Skull Base To Thigh  Result Date: 04/22/2020 CLINICAL DATA:  Initial treatment strategy for base of the tongue neoplasm. EXAM: NUCLEAR MEDICINE PET SKULL BASE TO THIGH TECHNIQUE: 15.56 mCi F-18 FDG was injected intravenously. Full-ring PET imaging was performed from the skull base to thigh after the radiotracer. CT data was obtained and used for attenuation correction and anatomic localization. Fasting blood glucose: 122 mg/dl COMPARISON:  Neck CT 04/07/2020 FINDINGS: Mediastinal blood pool activity: SUV max 3.28 Liver activity: SUV max NA NECK: The large heterogeneous right tongue base mass is markedly hypermetabolic with SUV max of [id number]. Large bilateral multilevel hypermetabolic metastatic lymphadenopathy as demonstrated on the CT scan. The largest left-sided node measuring 5 cm has an SUV max of 14.28. On the right side the largest node has an SUV max of 9.10. The lowest right supraclavicular node has an SUV max of 15.22. Incidental CT findings: none CHEST: No axillary, mediastinal or hilar lymphadenopathy is identified. No worrisome pulmonary nodules to suggest pulmonary metastatic disease. Incidental CT findings: Scattered aortic calcifications but no aneurysm. Scattered coronary artery calcifications are also noted. ABDOMEN/PELVIS: No abnormal hypermetabolic activity within the liver, pancreas,  adrenal glands, or spleen. No hypermetabolic lymph nodes in the abdomen or pelvis. Incidental CT findings: none SKELETON: No focal hypermetabolic activity to suggest skeletal metastasis. Incidental CT findings: Left femoral hardware noted. IMPRESSION: 1. Hypermetabolic right tongue base mass and associated hypermetabolic bilateral multilevel cervical lymphadenopathy. 2. No findings for metastatic disease involving the chest, as/pelvis or bony structures. Electronically Signed   By: Marijo Sanes M.D.   On: 04/22/2020 09:22   [place] CORE BIOPSY (LYMPH NODES)  Result Date: 04/08/2020 INDICATION: Multiple enlarged bilateral cervical lymph nodes and mass involving the base of the tongue. EXAM: ULTRASOUND GUIDED CORE BIOPSY OF LEFT CERVICAL LYMPH NODE MASS MEDICATIONS: None. ANESTHESIA/SEDATION: No sedation was administered. PROCEDURE: The procedure, risks, benefits, and alternatives were explained to the patient. Questions  regarding the procedure were encouraged and answered. The patient understands and consents to the procedure. A time-out was performed prior to initiating the procedure. Ultrasound was performed of cervical lymph nodes on both sides of the neck. The left neck was prepped with chlorhexidine in a sterile fashion, and a sterile drape was applied covering the operative field. A sterile gown and sterile gloves were used for the procedure. Local anesthesia was provided with 1% Lidocaine. An 18 gauge core biopsy device was utilized in obtaining 6 separate core biopsy samples in different portions of an enlarged left cervical lymph node. Core biopsy samples were submitted in formalin and on saline soaked Telfa gauze. Additional ultrasound was performed after biopsy. COMPLICATIONS: None immediate. FINDINGS: Multiple enlarged lymph nodes are noted in the neck bilaterally. The largest lymph node on the right measures 4.8 cm in greatest length. The dominant lymph node mass on the left measures 7.5 x 5.3 x 7.2 cm.  All of the enlarged lymph nodes are solid and abnormal by ultrasound. Solid tissue was obtained from the dominant enlarged left cervical lymph node. IMPRESSION: Ultrasound-guided core biopsy performed of an enlarged left cervical lymph node measuring up to 7.5 cm in greatest diameter. Electronically Signed   By: Aletta Edouard M.D.   On: 04/08/2020 12:15   IR IMAGING GUIDED PORT INSERTION  Addendum Date: 04/16/2020   ADDENDUM REPORT: 04/16/2020 15:03 INDICATION: 59 year old male with history of squamous cell carcinoma of the base of the tongue requiring central venous access for chemotherapy. EXAM: IMPLANTED PORT A CATH PLACEMENT WITH ULTRASOUND AND FLUOROSCOPIC GUIDANCE COMPARISON:  None. MEDICATIONS: Vancomycin 1 gm IV; The antibiotic was administered within an appropriate time interval prior to skin puncture. ANESTHESIA/SEDATION: Moderate (conscious) sedation was employed during this procedure. A total of Versed 2 mg and Fentanyl 100 mcg was administered intravenously. Moderate Sedation Time: 26 minutes. The patient's level of consciousness and vital signs were monitored continuously by radiology nursing throughout the procedure under my direct supervision. CONTRAST:  None FLUOROSCOPY TIME:  0.2 minutes,  (3 mGy) COMPLICATIONS: None immediate. PROCEDURE: The procedure, risks, benefits, and alternatives were explained to the patient. Questions regarding the procedure were encouraged and answered. The patient understands and consents to the procedure. The right neck and chest were prepped with chlorhexidine in a sterile fashion, and a sterile drape was applied covering the operative field. Maximum barrier sterile technique with sterile gowns and gloves were used for the procedure. A timeout was performed prior to the initiation of the procedure. Ultrasound was used to examine the jugular vein which was compressible and free of internal echoes. A skin marker was used to demarcate the planned venotomy and port  pocket incision sites. Local anesthesia was provided to these sites and the subcutaneous tunnel track with 1% lidocaine with 1:100,000 epinephrine. A small incision was created at the jugular access site and blunt dissection was performed of the subcutaneous tissues. Under real time ultrasound guidance, the jugular vein was accessed with a 21 ga micropuncture needle and an 0.018" wire was inserted to the superior vena cava. A 5 Fr micopuncture set was then used, through which a 0.035" Rosen wire was passed under fluoroscopic guidance into the inferior vena cava. An 8 Fr dilator was then placed over the wire. A subcutaneous port pocket was then created along the upper chest wall utilizing a combination of sharp and blunt dissection. The pocket was irrigated with sterile saline, packed with gauze, and observed for hemorrhage. A single lumen "ISP" sized power injectable  port was chosen for placement. The 8 Fr catheter was tunneled from the port pocket site to the venotomy incision. The port was placed in the pocket. The external catheter was trimmed to appropriate length. The dilator was exchanged for an 8 Fr peel-away sheath under fluoroscopic guidance. The catheter was then placed through the sheath and the sheath was removed. Final catheter positioning was confirmed and documented with a fluoroscopic spot radiograph. The port was accessed with a Huber needle, aspirated, and flushed with heparinized saline. The deep dermal layer of the port pocket incision was closed with interrupted 3-0 Vicryl suture. Dermabond was then placed over the port pocket and neck incisions. The patient tolerated the procedure well without immediate post procedural complication. FINDINGS: After catheter placement, the tip lies within the superior cavoatrial junction. The catheter aspirates and flushes normally and is ready for immediate use. IMPRESSION: Successful placement of a power injectable Port-A-Cath via the right internal jugular  vein. The catheter is ready for immediate use. Ruthann Cancer, MD [facility] Electronically Signed   By: Ruthann Cancer MD   On: 04/16/2020 15:03   Result Date: 04/16/2020 CLINICAL DATA:  Surgical port placement for poor venous access EXAM: IR IMAGING GUIDED PORT INSERTION COMPARISON:  None. FINDINGS: A single frontal saved images submitted for review. The image demonstrates a right IJ approach single-lumen power injectable portacatheter. The catheter tip overlies the mid SVC. Inspiratory volumes are low. No pneumothorax. IMPRESSION: Right IJ approach single-lumen power injectable portacatheter. Electronically Signed: By: Jacqulynn Cadet M.D. 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  Locally advanced squamous cell carcinoma of the tongue: PET scan results reviewed independently and reported as above confirming stage of disease.  Plan is for concurrent daily XRT along with weekly cisplatin for 6-8 cycles.  Continue daily XRT.  Proceed with cycle 1 of cisplatin today.  Return to clinic in 1 week for further evaluation and consideration of cycle 2. 2.  Dysphagia/sore throat: Continue Tylenol and/or Advil sparingly.  Consider Carafate in the future.  I spent a total of 30 minutes reviewing chart data, face-to-face evaluation with the patient, counseling and coordination of care as detailed above.  Patient expressed understanding and was in agreement with this plan. He also understands that He can call clinic at any time with any questions, concerns, or complaints.    Lloyd Huger, MD   05/04/2020 10:01 AM

## 2020-05-03 ENCOUNTER — Other Ambulatory Visit: Payer: Self-pay | Admitting: Oncology

## 2020-05-03 ENCOUNTER — Ambulatory Visit: Admission: RE | Admit: 2020-05-03 | Payer: 59 | Source: Ambulatory Visit

## 2020-05-03 DIAGNOSIS — C01 Malignant neoplasm of base of tongue: Secondary | ICD-10-CM

## 2020-05-03 DIAGNOSIS — Z7189 Other specified counseling: Secondary | ICD-10-CM

## 2020-05-04 ENCOUNTER — Inpatient Hospital Stay (HOSPITAL_BASED_OUTPATIENT_CLINIC_OR_DEPARTMENT_OTHER): Payer: 59 | Admitting: Oncology

## 2020-05-04 ENCOUNTER — Encounter: Payer: Self-pay | Admitting: Oncology

## 2020-05-04 ENCOUNTER — Inpatient Hospital Stay: Payer: 59

## 2020-05-04 ENCOUNTER — Other Ambulatory Visit: Payer: Self-pay

## 2020-05-04 ENCOUNTER — Ambulatory Visit
Admission: RE | Admit: 2020-05-04 | Discharge: 2020-05-04 | Disposition: A | Discharge status: Home / Self Care | Payer: 59 | Source: Ambulatory Visit | Attending: Radiation Oncology | Admitting: Radiation Oncology

## 2020-05-04 VITALS — BP 167/93 | HR 81 | Temp 98.3°F | Resp 18 | Wt 343.4 lb

## 2020-05-04 DIAGNOSIS — C01 Malignant neoplasm of base of tongue: Secondary | ICD-10-CM

## 2020-05-04 DIAGNOSIS — Z510 Encounter for antineoplastic radiation therapy: Secondary | ICD-10-CM | POA: Diagnosis not present

## 2020-05-04 DIAGNOSIS — Z5111 Encounter for antineoplastic chemotherapy: Secondary | ICD-10-CM | POA: Diagnosis not present

## 2020-05-04 DIAGNOSIS — Z7189 Other specified counseling: Secondary | ICD-10-CM

## 2020-05-04 LAB — BASIC METABOLIC PANEL
Anion gap: 8 (ref 5–15)
BUN: 20 mg/dL (ref 6–20)
CO2: 26 mmol/L (ref 22–32)
Calcium: 8.8 mg/dL — ABNORMAL LOW (ref 8.9–10.3)
Chloride: 102 mmol/L (ref 98–111)
Creatinine, Ser: 0.94 mg/dL (ref 0.61–1.24)
GFR, Estimated: >60 mL/min (ref >60)
Glucose, Bld: 159 mg/dL — ABNORMAL HIGH (ref 70–99)
Potassium: 3.9 mmol/L (ref 3.5–5.1)
Sodium: 136 mmol/L (ref 135–145)

## 2020-05-04 LAB — CBC WITH DIFFERENTIAL/PLATELET
Abs Immature Granulocytes: 0.02 10*3/uL (ref 0.00–0.07)
Basophils Absolute: 0.1 10*3/uL (ref 0.0–0.1)
Basophils Relative: 1 %
Eosinophils Absolute: 0.4 10*3/uL (ref 0.0–0.5)
Eosinophils Relative: 7 %
HCT: 44.2 % (ref 39.0–52.0)
Hemoglobin: 15.2 g/dL (ref 13.0–17.0)
Immature Granulocytes: 0 %
Lymphocytes Relative: 30 %
Lymphs Abs: 1.7 10*3/uL (ref 0.7–4.0)
MCH: 31.5 pg (ref 26.0–34.0)
MCHC: 34.4 g/dL (ref 30.0–36.0)
MCV: 91.7 fL (ref 80.0–100.0)
Monocytes Absolute: 0.7 10*3/uL (ref 0.1–1.0)
Monocytes Relative: 12 %
Neutro Abs: 2.7 10*3/uL (ref 1.7–7.7)
Neutrophils Relative %: 50 %
Platelets: 183 10*3/uL (ref 150–400)
RBC: 4.82 MIL/uL (ref 4.22–5.81)
RDW: 13.2 % (ref 11.5–15.5)
WBC: 5.5 10*3/uL (ref 4.0–10.5)
nRBC: 0 % (ref 0.0–0.2)

## 2020-05-04 LAB — MAGNESIUM: Magnesium: 2.1 mg/dL (ref 1.7–2.4)

## 2020-05-04 MED ORDER — SODIUM CHLORIDE 0.9 % IV SOLN
Freq: Once | INTRAVENOUS | Status: AC
Start: 2020-05-04 — End: 2020-05-04
  Filled 2020-05-04: qty 250

## 2020-05-04 MED ORDER — SODIUM CHLORIDE 0.9 % IV SOLN
10.0000 mg | Freq: Once | INTRAVENOUS | Status: AC
  Administered 2020-05-04: 12:00:00 10 mg via INTRAVENOUS
  Filled 2020-05-04: qty 10

## 2020-05-04 MED ORDER — SODIUM CHLORIDE 0.9 % IV SOLN
40.0000 mg/m2 | Freq: Once | INTRAVENOUS | Status: AC
  Administered 2020-05-04: 13:00:00 111 mg via INTRAVENOUS
  Filled 2020-05-04: qty 100

## 2020-05-04 MED ORDER — SODIUM CHLORIDE 0.9 % IV SOLN: Freq: Once | INTRAVENOUS | Status: DC

## 2020-05-04 MED ORDER — HEPARIN SOD (PORK) LOCK FLUSH 100 UNIT/ML IV SOLN
500.0000 [IU] | Freq: Once | INTRAVENOUS | Status: AC | PRN
Start: 2020-05-04 — End: 2020-05-04
  Administered 2020-05-04: 15:00:00 500 [IU]
  Filled 2020-05-04: qty 5

## 2020-05-04 MED ORDER — MAGNESIUM SULFATE 2 GM/50ML IV SOLN
2.0000 g | Freq: Once | INTRAVENOUS | Status: AC
  Administered 2020-05-04: 10:00:00 2 g via INTRAVENOUS
  Filled 2020-05-04: qty 50

## 2020-05-04 MED ORDER — SODIUM CHLORIDE 0.9 % IV SOLN
150.0000 mg | Freq: Once | INTRAVENOUS | Status: AC
  Administered 2020-05-04: 12:00:00 150 mg via INTRAVENOUS
  Filled 2020-05-04: qty 150

## 2020-05-04 MED ORDER — POTASSIUM CHLORIDE IN NACL 20-0.9 MEQ/L-% IV SOLN
Freq: Once | INTRAVENOUS | Status: AC
  Filled 2020-05-04: qty 1000

## 2020-05-04 MED ORDER — HEPARIN SOD (PORK) LOCK FLUSH 100 UNIT/ML IV SOLN
INTRAVENOUS | Status: AC
  Filled 2020-05-04: qty 5

## 2020-05-04 MED ORDER — PALONOSETRON HCL INJECTION 0.25 MG/5ML
0.2500 mg | Freq: Once | INTRAVENOUS | Status: AC
  Administered 2020-05-04: 12:00:00 0.25 mg via INTRAVENOUS
  Filled 2020-05-04: qty 5

## 2020-05-04 NOTE — Progress Notes (Signed)
Pt here for C1D1 of cisplatin. Evaluated by MD. Consent obtained. Treatment given as ordered. Completed without incident. Pt discharged stable.

## 2020-05-05 ENCOUNTER — Telehealth: Payer: Self-pay

## 2020-05-05 ENCOUNTER — Ambulatory Visit
Admission: RE | Admit: 2020-05-05 | Discharge: 2020-05-05 | Disposition: A | Discharge status: Home / Self Care | Payer: 59 | Source: Ambulatory Visit | Attending: Radiation Oncology | Admitting: Radiation Oncology

## 2020-05-05 DIAGNOSIS — Z510 Encounter for antineoplastic radiation therapy: Secondary | ICD-10-CM | POA: Diagnosis not present

## 2020-05-05 NOTE — Telephone Encounter (Signed)
Telephone call to patient for follow up after receiving first infusion.   Patient states infusion went great.  States eating good and drinking plenty of fluids.   Denies any nausea or vomiting.  Encouraged patient to call for any concerns or questions. 

## 2020-05-06 ENCOUNTER — Other Ambulatory Visit: Payer: Self-pay

## 2020-05-06 ENCOUNTER — Ambulatory Visit
Admission: RE | Admit: 2020-05-06 | Discharge: 2020-05-06 | Disposition: A | Discharge status: Home / Self Care | Payer: 59 | Source: Ambulatory Visit | Attending: Radiation Oncology | Admitting: Radiation Oncology

## 2020-05-06 ENCOUNTER — Inpatient Hospital Stay: Payer: 59

## 2020-05-06 DIAGNOSIS — Z510 Encounter for antineoplastic radiation therapy: Secondary | ICD-10-CM | POA: Diagnosis not present

## 2020-05-06 NOTE — Progress Notes (Signed)
Nutrition Assessment:  59 year old male with squamous cell carcinoma of base of tongue with bilateral lymphadenopathy.  Past medical history of DM, HTN.  Patient receiving concurrent chemotherapy and radiation.  Met with patient today following radiation.  Patient reports normal appetite and ability to eat all consistencies of foods. Reports missing 5 teeth.  Reports for breakfast sometimes will eat yogurt with fruit and drink coffee. Around 11am will eat sandwich with mayo, no cheese.  Around 4pm will sometimes eat another sandwich. Reports that he and wife are working from home at this time.  Dinner is around 6pm and may have pizza or pasta or last night ate spicy fish (irritated mouth) mashed potatoes and macaroni and cheese.  Likes carnation instant breakfast mixed with 2% milk.  Does not check blood glucose. Reports that no one ever told him he should.      Medications: zofran, compazine  Labs: reviewed  Anthropometrics:   Height: 71 inches Weight: 343 lb on 12/28 UBW: 345 lb on 03/10/20 BMI: 47   Estimated Energy Needs  Kcals: 4271-5664 Protein: 117-156 g Using IBW (1.5-2.0) Fluid:   NUTRITION DIAGNOSIS: Predicted sub optimal energy intake related to cancer as evidenced by anticipated side effects from treatment effecting oral intake.    INTERVENTION:  Discussed importance of good nutrition during treatment Discussed foods high in protein. Provide handout on soft moist protein foods Provided recipe booklet. Provided samples of ensure complete, glucerna and boost plus, orgain protein powder.  Encouraged patient to be mindful about sugary foods while eating normally to control blood glucose.  Recommend evaluation and follow-up by SLP. Contact information given to patient    MONITORING, EVALUATION, GOAL: weight trends, intake   NEXT VISIT: Jan 13 after radiation  Latiesha Harada B. Zenia Resides, [place], [place] Registered Dietitian [phone] (mobile)

## 2020-05-10 ENCOUNTER — Ambulatory Visit
Admission: RE | Admit: 2020-05-10 | Discharge: 2020-05-10 | Disposition: A | Discharge status: Home / Self Care | Payer: 59 | Source: Ambulatory Visit | Attending: Radiation Oncology | Admitting: Radiation Oncology

## 2020-05-10 DIAGNOSIS — N179 Acute kidney failure, unspecified: Secondary | ICD-10-CM | POA: Diagnosis not present

## 2020-05-10 DIAGNOSIS — K1230 Oral mucositis (ulcerative), unspecified: Secondary | ICD-10-CM | POA: Diagnosis not present

## 2020-05-10 DIAGNOSIS — Z510 Encounter for antineoplastic radiation therapy: Secondary | ICD-10-CM | POA: Insufficient documentation

## 2020-05-10 DIAGNOSIS — Z5111 Encounter for antineoplastic chemotherapy: Secondary | ICD-10-CM | POA: Diagnosis not present

## 2020-05-10 DIAGNOSIS — C770 Secondary and unspecified malignant neoplasm of lymph nodes of head, face and neck: Secondary | ICD-10-CM | POA: Diagnosis not present

## 2020-05-10 DIAGNOSIS — C01 Malignant neoplasm of base of tongue: Secondary | ICD-10-CM | POA: Insufficient documentation

## 2020-05-11 ENCOUNTER — Ambulatory Visit
Admission: RE | Admit: 2020-05-11 | Discharge: 2020-05-11 | Disposition: A | Discharge status: Home / Self Care | Payer: 59 | Source: Ambulatory Visit | Attending: Radiation Oncology | Admitting: Radiation Oncology

## 2020-05-11 ENCOUNTER — Other Ambulatory Visit: Payer: Self-pay | Admitting: *Deleted

## 2020-05-11 DIAGNOSIS — Z5111 Encounter for antineoplastic chemotherapy: Secondary | ICD-10-CM | POA: Diagnosis not present

## 2020-05-11 MED ORDER — SUCRALFATE 1 G PO TABS: 1.0000 g | ORAL_TABLET | Freq: Three times a day (TID) | ORAL | 1 refills | Status: DC

## 2020-05-12 ENCOUNTER — Inpatient Hospital Stay: Payer: 59

## 2020-05-12 ENCOUNTER — Inpatient Hospital Stay (HOSPITAL_BASED_OUTPATIENT_CLINIC_OR_DEPARTMENT_OTHER): Payer: 59 | Admitting: Oncology

## 2020-05-12 ENCOUNTER — Ambulatory Visit
Admission: RE | Admit: 2020-05-12 | Discharge: 2020-05-12 | Disposition: A | Discharge status: Home / Self Care | Payer: 59 | Source: Ambulatory Visit | Attending: Radiation Oncology | Admitting: Radiation Oncology

## 2020-05-12 ENCOUNTER — Other Ambulatory Visit: Payer: Self-pay

## 2020-05-12 ENCOUNTER — Encounter: Payer: Self-pay | Admitting: Internal Medicine

## 2020-05-12 ENCOUNTER — Inpatient Hospital Stay: Payer: 59 | Attending: Oncology

## 2020-05-12 VITALS — BP 146/77 | HR 78 | Temp 98.3°F | Resp 18 | Wt 330.0 lb

## 2020-05-12 DIAGNOSIS — K1230 Oral mucositis (ulcerative), unspecified: Secondary | ICD-10-CM | POA: Insufficient documentation

## 2020-05-12 DIAGNOSIS — C01 Malignant neoplasm of base of tongue: Secondary | ICD-10-CM | POA: Diagnosis not present

## 2020-05-12 DIAGNOSIS — N179 Acute kidney failure, unspecified: Secondary | ICD-10-CM | POA: Insufficient documentation

## 2020-05-12 DIAGNOSIS — C770 Secondary and unspecified malignant neoplasm of lymph nodes of head, face and neck: Secondary | ICD-10-CM | POA: Insufficient documentation

## 2020-05-12 DIAGNOSIS — Z510 Encounter for antineoplastic radiation therapy: Secondary | ICD-10-CM | POA: Insufficient documentation

## 2020-05-12 DIAGNOSIS — B372 Candidiasis of skin and nail: Secondary | ICD-10-CM

## 2020-05-12 DIAGNOSIS — Z5111 Encounter for antineoplastic chemotherapy: Secondary | ICD-10-CM | POA: Insufficient documentation

## 2020-05-12 DIAGNOSIS — Z7189 Other specified counseling: Secondary | ICD-10-CM

## 2020-05-12 LAB — MAGNESIUM: Magnesium: 2.2 mg/dL (ref 1.7–2.4)

## 2020-05-12 LAB — BASIC METABOLIC PANEL
Anion gap: 6 (ref 5–15)
BUN: 18 mg/dL (ref 6–20)
CO2: 28 mmol/L (ref 22–32)
Calcium: 8.9 mg/dL (ref 8.9–10.3)
Chloride: 98 mmol/L (ref 98–111)
Creatinine, Ser: 1.24 mg/dL (ref 0.61–1.24)
GFR, Estimated: >60 mL/min (ref >60)
Glucose, Bld: 164 mg/dL — ABNORMAL HIGH (ref 70–99)
Potassium: 4 mmol/L (ref 3.5–5.1)
Sodium: 132 mmol/L — ABNORMAL LOW (ref 135–145)

## 2020-05-12 LAB — CBC WITH DIFFERENTIAL/PLATELET
Abs Immature Granulocytes: 0.04 10*3/uL (ref 0.00–0.07)
Basophils Absolute: 0.1 10*3/uL (ref 0.0–0.1)
Basophils Relative: 1 %
Eosinophils Absolute: 0.2 10*3/uL (ref 0.0–0.5)
Eosinophils Relative: 3 %
HCT: 44.4 % (ref 39.0–52.0)
Hemoglobin: 15.6 g/dL (ref 13.0–17.0)
Immature Granulocytes: 1 %
Lymphocytes Relative: 21 %
Lymphs Abs: 1.5 10*3/uL (ref 0.7–4.0)
MCH: 31.9 pg (ref 26.0–34.0)
MCHC: 35.1 g/dL (ref 30.0–36.0)
MCV: 90.8 fL (ref 80.0–100.0)
Monocytes Absolute: 1 10*3/uL (ref 0.1–1.0)
Monocytes Relative: 15 %
Neutro Abs: 4 10*3/uL (ref 1.7–7.7)
Neutrophils Relative %: 59 %
Platelets: 212 10*3/uL (ref 150–400)
RBC: 4.89 MIL/uL (ref 4.22–5.81)
RDW: 12.4 % (ref 11.5–15.5)
WBC: 6.8 10*3/uL (ref 4.0–10.5)
nRBC: 0 % (ref 0.0–0.2)

## 2020-05-12 MED ORDER — SODIUM CHLORIDE 0.9% FLUSH
10.0000 mL | Freq: Once | INTRAVENOUS | Status: AC
  Administered 2020-05-12: 10 mL via INTRAVENOUS
  Filled 2020-05-12: qty 10

## 2020-05-12 MED ORDER — PALONOSETRON HCL INJECTION 0.25 MG/5ML
0.2500 mg | Freq: Once | INTRAVENOUS | Status: AC
  Administered 2020-05-12: 0.25 mg via INTRAVENOUS
  Filled 2020-05-12: qty 5

## 2020-05-12 MED ORDER — HEPARIN SOD (PORK) LOCK FLUSH 100 UNIT/ML IV SOLN
500.0000 [IU] | Freq: Once | INTRAVENOUS | Status: AC
  Administered 2020-05-12: 500 [IU] via INTRAVENOUS
  Filled 2020-05-12: qty 5

## 2020-05-12 MED ORDER — FLUCONAZOLE 200 MG PO TABS: 200.0000 mg | ORAL_TABLET | Freq: Every day | ORAL | 0 refills | Status: DC

## 2020-05-12 MED ORDER — SODIUM CHLORIDE 0.9 % IV SOLN
Freq: Once | INTRAVENOUS | Status: AC
  Filled 2020-05-12: qty 250

## 2020-05-12 MED ORDER — SODIUM CHLORIDE 0.9 % IV SOLN
10.0000 mg | Freq: Once | INTRAVENOUS | Status: AC
  Administered 2020-05-12: 10 mg via INTRAVENOUS
  Filled 2020-05-12: qty 1

## 2020-05-12 MED ORDER — SODIUM CHLORIDE 0.9 % IV SOLN
150.0000 mg | Freq: Once | INTRAVENOUS | Status: AC
  Administered 2020-05-12: 150 mg via INTRAVENOUS
  Filled 2020-05-12: qty 150

## 2020-05-12 MED ORDER — POTASSIUM CHLORIDE IN NACL 20-0.9 MEQ/L-% IV SOLN
Freq: Once | INTRAVENOUS | Status: AC
  Filled 2020-05-12: qty 1000

## 2020-05-12 MED ORDER — HEPARIN SOD (PORK) LOCK FLUSH 100 UNIT/ML IV SOLN
INTRAVENOUS | Status: AC
  Filled 2020-05-12: qty 5

## 2020-05-12 MED ORDER — MAGNESIUM SULFATE 2 GM/50ML IV SOLN
2.0000 g | Freq: Once | INTRAVENOUS | Status: AC
  Administered 2020-05-12: 2 g via INTRAVENOUS
  Filled 2020-05-12: qty 50

## 2020-05-12 MED ORDER — SODIUM CHLORIDE 0.9 % IV SOLN
40.0000 mg/m2 | Freq: Once | INTRAVENOUS | Status: AC
  Administered 2020-05-12: 111 mg via INTRAVENOUS
  Filled 2020-05-12: qty 100

## 2020-05-12 MED ORDER — NYSTATIN 100000 UNIT/GM EX POWD: 1.0000 "application " | Freq: Three times a day (TID) | CUTANEOUS | 0 refills | Status: DC

## 2020-05-12 MED ORDER — MAGIC MOUTHWASH: 5.0000 mL | Freq: Three times a day (TID) | ORAL | 0 refills | Status: DC | PRN

## 2020-05-12 NOTE — Progress Notes (Signed)
Joshua Bray tolerated his Cisplatin treatment without any complications today.

## 2020-05-12 NOTE — Progress Notes (Signed)
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    He was evaluated last week prior to cycle 1 and was doing well.   He started daily radiation on 05/04/20.   Appears to have tolerated first treatment well.  Complains the roof of his mouth being sensitive.  He uses Biotene mouthwash and others to keep his mouth clean and free of bacteria.  He also complains of a yeast infection between the folds of his abdomen that extended into his groin near his genitals.  States this is getting worse since he started treatment.  He has been battling yeast infections for several years now since he stopped Metformin for his diabetes.  He denies any recent fevers or illness.  His appetite is great but he is unable to taste much.  He has started to bland his food to help get it down.  He is swallowing well. He has a sore throat.  He was prescribed Carafate by Dr. Aggie Cosier.  He has no chest pain, shortness of breath, cough, or hemoptysis.  He denies any nausea, vomiting, constipation, or diarrhea.  He has no urinary complaints.  Patient otherwise feels well and offers no further specific complaints today.  REVIEW OF SYSTEMS:   Review of Systems  Constitutional: Positive for malaise/fatigue and weight loss. Negative for fever.  HENT: Positive for sore throat.        Mouth pain  Respiratory: Negative.  Negative for cough, hemoptysis and shortness of breath.   Cardiovascular: Negative.  Negative for chest pain and leg swelling.   Gastrointestinal: Negative.  Negative for abdominal pain and heartburn.  Genitourinary: Negative.  Negative for dysuria.  Musculoskeletal: Negative.  Negative for back pain.  Skin: Positive for itching and rash (Between his legs and pannus extending towards his genitals).  Neurological: Negative.  Negative for dizziness, focal weakness, weakness and headaches.  Psychiatric/Behavioral: Negative.  The patient is not nervous/anxious.     As per HPI. Otherwise, a complete review of systems is negative.  PAST MEDICAL HISTORY: Past Medical History:  Diagnosis Date  . Diabetes mellitus without complication (HCC)   . Hypertension     PAST SURGICAL HISTORY: Past Surgical History:  Procedure Laterality Date  . FEMUR SURGERY Left 1983  . IR IMAGING GUIDED PORT INSERTION  04/16/2020    FAMILY HISTORY: Family History  Problem Relation Age of Onset  . Thyroid disease Mother   . Leukemia Father   . Heart disease Father   . Diabetes Father     ADVANCED DIRECTIVES (Y/N):  N  HEALTH MAINTENANCE: Social History   Tobacco Use  . Smoking status: Former Smoker    Years: 4.00    Quit date: 03/10/1990    Years since quitting: 30.1  . Smokeless tobacco: Never Used  Vaping Use  . Vaping Use: Never used  Substance Use Topics  . Alcohol use: Yes    Alcohol/week: 5.0 standard drinks    Types: 5 Cans of beer per week  . Drug use: Not Currently     Colonoscopy:  PAP:  Bone density:  Lipid panel:  Allergies  Allergen Reactions  . Penicillins     Unknown, allergy from an infant    Current Outpatient Medications  Medication Sig Dispense Refill  . acetaminophen (TYLENOL) 500 MG tablet Take 1,000 mg by mouth 2 (two) times daily.    Marland Kitchen amLODipine (NORVASC) 10 MG tablet Take 1 tablet (10 mg total) by mouth daily. 90 tablet 1  . ezetimibe (ZETIA) 10 MG tablet Take 1 tablet (10 mg total) by mouth daily. (Patient not taking: Reported on 05/04/2020) 90 tablet 3  . hydrochlorothiazide  (HYDRODIURIL) 12.5 MG tablet Take 1 tablet (12.5 mg total) by mouth daily. (Patient not taking: Reported on 05/04/2020) 90 tablet 1  . ibuprofen (ADVIL) 200 MG tablet Take 400 mg by mouth 2 (two) times daily.    Marland Kitchen lidocaine-prilocaine (EMLA) cream Apply 1 application topically as needed. (Patient not taking: Reported on 05/04/2020) 30 g 0  . metFORMIN (GLUCOPHAGE) 500 MG tablet Take 1 tablet (500 mg total) by mouth 2 (two) times daily with a meal. 180 tablet 1  . mupirocin ointment (BACTROBAN) 2 % Apply 1 application topically 2 (two) times daily. (Patient not taking: Reported on 05/04/2020) 22 g 1  . ondansetron (ZOFRAN) 8 MG tablet Take 1 tablet (8 mg total) by mouth every 8 (eight) hours as needed for nausea or vomiting. (Patient not taking: Reported on 05/04/2020) 20 tablet 0  . prochlorperazine (COMPAZINE) 10 MG tablet Take 1 tablet (10 mg total) by mouth every 6 (six) hours as needed for nausea or vomiting. (Patient not taking: Reported on 05/04/2020) 30 tablet 0  . rosuvastatin (CRESTOR) 20 MG tablet Take 1 tablet (20 mg total) by mouth daily. (Patient not taking: Reported on 05/04/2020) 90 tablet 3  . sucralfate (CARAFATE) 1 g tablet Take 1 tablet (1 g total) by mouth 3 (three) times daily. Dissolve in 3-4 tbsp warm water, swish and swallow. 90 tablet 1   No current facility-administered medications for this visit.   Facility-Administered Medications Ordered in Other Visits  Medication Dose Route Frequency Provider Last Rate Last Admin  . heparin lock flush 100 unit/mL  500 Units Intravenous Once Cammie Sickle, MD        OBJECTIVE: There were no vitals filed for this visit.   Body mass index is 46.03 kg/m.    ECOG FS:0 - Asymptomatic  Physical Exam HENT:     Mouth/Throat:     Mouth: Oral lesions present.   Lymphadenopathy:     Head:     Left side of head: Submandibular adenopathy present.      LAB RESULTS:  Lab Results  Component Value Date   NA 132 (L) 05/12/2020    K 4.0 05/12/2020   CL 98 05/12/2020   CO2 28 05/12/2020   GLUCOSE 164 (H) 05/12/2020   BUN 18 05/12/2020   CREATININE 1.24 05/12/2020   CALCIUM 8.9 05/12/2020   PROT 7.9 04/09/2020   ALBUMIN 4.3 04/09/2020   AST 31 04/09/2020   ALT 36 04/09/2020   ALKPHOS 77 04/09/2020   BILITOT 0.7 04/09/2020   GFRNONAA >60 05/12/2020   GFRAA 87 03/10/2020    Lab Results  Component Value Date   WBC 6.8 05/12/2020   NEUTROABS 4.0 05/12/2020   HGB 15.6 05/12/2020   HCT 44.4 05/12/2020   MCV 90.8 05/12/2020   PLT 212 05/12/2020     STUDIES: NM PET Image Initial (PI) Skull Base To Thigh  Result Date: 04/22/2020 CLINICAL DATA:  Initial treatment strategy for base of the  tongue neoplasm. EXAM: NUCLEAR MEDICINE PET SKULL BASE TO THIGH TECHNIQUE: 15.56 mCi F-18 FDG was injected intravenously. Full-ring PET imaging was performed from the skull base to thigh after the radiotracer. CT data was obtained and used for attenuation correction and anatomic localization. Fasting blood glucose: 122 mg/dl COMPARISON:  Neck CT 04/07/2020 FINDINGS: Mediastinal blood pool activity: SUV max 3.28 Liver activity: SUV max NA NECK: The large heterogeneous right tongue base mass is markedly hypermetabolic with SUV max of [id number]. Large bilateral multilevel hypermetabolic metastatic lymphadenopathy as demonstrated on the CT scan. The largest left-sided node measuring 5 cm has an SUV max of 14.28. On the right side the largest node has an SUV max of 9.10. The lowest right supraclavicular node has an SUV max of 15.22. Incidental CT findings: none CHEST: No axillary, mediastinal or hilar lymphadenopathy is identified. No worrisome pulmonary nodules to suggest pulmonary metastatic disease. Incidental CT findings: Scattered aortic calcifications but no aneurysm. Scattered coronary artery calcifications are also noted. ABDOMEN/PELVIS: No abnormal hypermetabolic activity within the liver, pancreas, adrenal glands, or spleen. No  hypermetabolic lymph nodes in the abdomen or pelvis. Incidental CT findings: none SKELETON: No focal hypermetabolic activity to suggest skeletal metastasis. Incidental CT findings: Left femoral hardware noted. IMPRESSION: 1. Hypermetabolic right tongue base mass and associated hypermetabolic bilateral multilevel cervical lymphadenopathy. 2. No findings for metastatic disease involving the chest, as/pelvis or bony structures. Electronically Signed   By: Marijo Sanes M.D.   On: 04/22/2020 09:22   IR IMAGING GUIDED PORT INSERTION  Addendum Date: 04/16/2020   ADDENDUM REPORT: 04/16/2020 15:03 INDICATION: 60 year old male with history of squamous cell carcinoma of the base of the tongue requiring central venous access for chemotherapy. EXAM: IMPLANTED PORT A CATH PLACEMENT WITH ULTRASOUND AND FLUOROSCOPIC GUIDANCE COMPARISON:  None. MEDICATIONS: Vancomycin 1 gm IV; The antibiotic was administered within an appropriate time interval prior to skin puncture. ANESTHESIA/SEDATION: Moderate (conscious) sedation was employed during this procedure. A total of Versed 2 mg and Fentanyl 100 mcg was administered intravenously. Moderate Sedation Time: 26 minutes. The patient's level of consciousness and vital signs were monitored continuously by radiology nursing throughout the procedure under my direct supervision. CONTRAST:  None FLUOROSCOPY TIME:  0.2 minutes,  (3 mGy) COMPLICATIONS: None immediate. PROCEDURE: The procedure, risks, benefits, and alternatives were explained to the patient. Questions regarding the procedure were encouraged and answered. The patient understands and consents to the procedure. The right neck and chest were prepped with chlorhexidine in a sterile fashion, and a sterile drape was applied covering the operative field. Maximum barrier sterile technique with sterile gowns and gloves were used for the procedure. A timeout was performed prior to the initiation of the procedure. Ultrasound was used to  examine the jugular vein which was compressible and free of internal echoes. A skin marker was used to demarcate the planned venotomy and port pocket incision sites. Local anesthesia was provided to these sites and the subcutaneous tunnel track with 1% lidocaine with 1:100,000 epinephrine. A small incision was created at the jugular access site and blunt dissection was performed of the subcutaneous tissues. Under real time ultrasound guidance, the jugular vein was accessed with a 21 ga micropuncture needle and an 0.018" wire was inserted to the superior vena cava. A 5 Fr micopuncture set was then used, through which a 0.035" Rosen wire was passed under fluoroscopic guidance into the inferior vena cava. An 8 Fr dilator was then placed over the wire. A subcutaneous port pocket was then created along  the upper chest wall utilizing a combination of sharp and blunt dissection. The pocket was irrigated with sterile saline, packed with gauze, and observed for hemorrhage. A single lumen "ISP" sized power injectable port was chosen for placement. The 8 Fr catheter was tunneled from the port pocket site to the venotomy incision. The port was placed in the pocket. The external catheter was trimmed to appropriate length. The dilator was exchanged for an 8 Fr peel-away sheath under fluoroscopic guidance. The catheter was then placed through the sheath and the sheath was removed. Final catheter positioning was confirmed and documented with a fluoroscopic spot radiograph. The port was accessed with a Huber needle, aspirated, and flushed with heparinized saline. The deep dermal layer of the port pocket incision was closed with interrupted 3-0 Vicryl suture. Dermabond was then placed over the port pocket and neck incisions. The patient tolerated the procedure well without immediate post procedural complication. FINDINGS: After catheter placement, the tip lies within the superior cavoatrial junction. The catheter aspirates and  flushes normally and is ready for immediate use. IMPRESSION: Successful placement of a power injectable Port-A-Cath via the right internal jugular vein. The catheter is ready for immediate use. Ruthann Cancer, MD [facility] Electronically Signed   By: Ruthann Cancer MD   On: 04/16/2020 15:03   Result Date: 04/16/2020 CLINICAL DATA:  Surgical port placement for poor venous access EXAM: IR IMAGING GUIDED PORT INSERTION COMPARISON:  None. FINDINGS: A single frontal saved images submitted for review. The image demonstrates a right IJ approach single-lumen power injectable portacatheter. The catheter tip overlies the mid SVC. Inspiratory volumes are low. No pneumothorax. IMPRESSION: Right IJ approach single-lumen power injectable portacatheter. Electronically Signed: By: Jacqulynn Cadet M.D. 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  Locally advanced squamous cell carcinoma of the tongue:  -PET scan results reviewed independently and reported as above confirming stage of disease.   -Plan is for concurrent daily XRT along with weekly cisplatin for 6-8 cycles. -Continue daily XRT.   -Proceed with cycle 2 of cisplatin today.   -Labs work stable. -Return to clinic in 1 week for further evaluation and consideration of cycle 3.   2.  Dysphagia/sore throat:  -He was given Carafate by Dr. Donella Stade earlier this week -Continue Tylenol and/or Advil sparingly.   -Recommend Healio's for prophylaxis-we will have this mailed to his [place] from [place]  -He also can try Magic mouthwash with lidocaine. Rx sent.   3.  Intertriginous dermatitis: -Complains of yeast infection between legs and underneath his abdomen extending into his groin.  -This is secondary to body habitus/obesity and DM.  -Recommend nystatin powder and Diflucan for 5 to 7 days. -Keep area  clean and dry.  Disposition: -Return to clinic in 1 week for lab work and reevaluation prior to cycle 3.  -Return to clinic daily for radiation.  Greater than 50% was spent in counseling and coordination of care with this patient including but not limited to discussion of the relevant topics above (See A&P) including, but not limited to diagnosis and management of acute and chronic medical conditions.   Patient expressed understanding and was in agreement with this plan. He also understands that He can call clinic at any time with any questions, concerns, or complaints.    Jacquelin Hawking, NP   05/12/2020 9:00 AM

## 2020-05-13 ENCOUNTER — Ambulatory Visit
Admission: RE | Admit: 2020-05-13 | Discharge: 2020-05-13 | Disposition: A | Discharge status: Home / Self Care | Payer: 59 | Source: Ambulatory Visit | Attending: Radiation Oncology | Admitting: Radiation Oncology

## 2020-05-13 DIAGNOSIS — Z5111 Encounter for antineoplastic chemotherapy: Secondary | ICD-10-CM | POA: Diagnosis not present

## 2020-05-14 ENCOUNTER — Ambulatory Visit
Admission: RE | Admit: 2020-05-14 | Discharge: 2020-05-14 | Disposition: A | Discharge status: Home / Self Care | Payer: 59 | Source: Ambulatory Visit | Attending: Radiation Oncology | Admitting: Radiation Oncology

## 2020-05-14 DIAGNOSIS — Z5111 Encounter for antineoplastic chemotherapy: Secondary | ICD-10-CM | POA: Diagnosis not present

## 2020-05-17 ENCOUNTER — Ambulatory Visit
Admission: RE | Admit: 2020-05-17 | Discharge: 2020-05-17 | Disposition: A | Discharge status: Home / Self Care | Payer: 59 | Source: Ambulatory Visit | Attending: Radiation Oncology | Admitting: Radiation Oncology

## 2020-05-17 ENCOUNTER — Other Ambulatory Visit: Payer: Self-pay | Admitting: Internal Medicine

## 2020-05-17 DIAGNOSIS — C01 Malignant neoplasm of base of tongue: Secondary | ICD-10-CM

## 2020-05-17 DIAGNOSIS — Z5111 Encounter for antineoplastic chemotherapy: Secondary | ICD-10-CM | POA: Diagnosis not present

## 2020-05-17 DIAGNOSIS — T451X5A Adverse effect of antineoplastic and immunosuppressive drugs, initial encounter: Secondary | ICD-10-CM

## 2020-05-17 MED ORDER — ONDANSETRON HCL 8 MG PO TABS: 8.0000 mg | ORAL_TABLET | Freq: Three times a day (TID) | ORAL | 0 refills | Status: DC | PRN

## 2020-05-18 ENCOUNTER — Ambulatory Visit
Admission: RE | Admit: 2020-05-18 | Discharge: 2020-05-18 | Disposition: A | Discharge status: Home / Self Care | Payer: 59 | Source: Ambulatory Visit | Attending: Radiation Oncology | Admitting: Radiation Oncology

## 2020-05-18 ENCOUNTER — Encounter: Payer: Self-pay | Admitting: *Deleted

## 2020-05-18 DIAGNOSIS — Z5111 Encounter for antineoplastic chemotherapy: Secondary | ICD-10-CM | POA: Diagnosis not present

## 2020-05-19 ENCOUNTER — Inpatient Hospital Stay (HOSPITAL_BASED_OUTPATIENT_CLINIC_OR_DEPARTMENT_OTHER): Payer: 59 | Admitting: Internal Medicine

## 2020-05-19 ENCOUNTER — Inpatient Hospital Stay: Payer: 59

## 2020-05-19 ENCOUNTER — Other Ambulatory Visit: Payer: Self-pay

## 2020-05-19 ENCOUNTER — Other Ambulatory Visit: Payer: Self-pay | Admitting: *Deleted

## 2020-05-19 ENCOUNTER — Ambulatory Visit
Admission: RE | Admit: 2020-05-19 | Discharge: 2020-05-19 | Disposition: A | Discharge status: Home / Self Care | Payer: 59 | Source: Ambulatory Visit | Attending: Radiation Oncology | Admitting: Radiation Oncology

## 2020-05-19 ENCOUNTER — Encounter: Payer: Self-pay | Admitting: *Deleted

## 2020-05-19 ENCOUNTER — Telehealth: Payer: Self-pay | Admitting: *Deleted

## 2020-05-19 DIAGNOSIS — Z7189 Other specified counseling: Secondary | ICD-10-CM

## 2020-05-19 DIAGNOSIS — C01 Malignant neoplasm of base of tongue: Secondary | ICD-10-CM | POA: Diagnosis not present

## 2020-05-19 DIAGNOSIS — Z5111 Encounter for antineoplastic chemotherapy: Secondary | ICD-10-CM | POA: Diagnosis not present

## 2020-05-19 LAB — CBC WITH DIFFERENTIAL/PLATELET
Abs Immature Granulocytes: 0.02 10*3/uL (ref 0.00–0.07)
Basophils Absolute: 0.1 10*3/uL (ref 0.0–0.1)
Basophils Relative: 1 %
Eosinophils Absolute: 0.1 10*3/uL (ref 0.0–0.5)
Eosinophils Relative: 2 %
HCT: 43.2 % (ref 39.0–52.0)
Hemoglobin: 15.4 g/dL (ref 13.0–17.0)
Immature Granulocytes: 0 %
Lymphocytes Relative: 19 %
Lymphs Abs: 1.1 10*3/uL (ref 0.7–4.0)
MCH: 32.6 pg (ref 26.0–34.0)
MCHC: 35.6 g/dL (ref 30.0–36.0)
MCV: 91.3 fL (ref 80.0–100.0)
Monocytes Absolute: 0.8 10*3/uL (ref 0.1–1.0)
Monocytes Relative: 14 %
Neutro Abs: 3.8 10*3/uL (ref 1.7–7.7)
Neutrophils Relative %: 64 %
Platelets: 223 10*3/uL (ref 150–400)
RBC: 4.73 MIL/uL (ref 4.22–5.81)
RDW: 12 % (ref 11.5–15.5)
WBC: 5.9 10*3/uL (ref 4.0–10.5)
nRBC: 0 % (ref 0.0–0.2)

## 2020-05-19 LAB — BASIC METABOLIC PANEL
Anion gap: 11 (ref 5–15)
BUN: 21 mg/dL — ABNORMAL HIGH (ref 6–20)
CO2: 24 mmol/L (ref 22–32)
Calcium: 9 mg/dL (ref 8.9–10.3)
Chloride: 99 mmol/L (ref 98–111)
Creatinine, Ser: 1.42 mg/dL — ABNORMAL HIGH (ref 0.61–1.24)
GFR, Estimated: 57 mL/min — ABNORMAL LOW (ref >60)
Glucose, Bld: 162 mg/dL — ABNORMAL HIGH (ref 70–99)
Potassium: 4 mmol/L (ref 3.5–5.1)
Sodium: 134 mmol/L — ABNORMAL LOW (ref 135–145)

## 2020-05-19 LAB — MAGNESIUM: Magnesium: 2.2 mg/dL (ref 1.7–2.4)

## 2020-05-19 MED ORDER — SODIUM CHLORIDE 0.9% FLUSH
10.0000 mL | INTRAVENOUS | Status: DC | PRN
  Administered 2020-05-19: 10 mL via INTRAVENOUS
  Filled 2020-05-19: qty 10

## 2020-05-19 MED ORDER — SODIUM CHLORIDE 0.9 % IV SOLN: Freq: Once | INTRAVENOUS | Status: DC

## 2020-05-19 MED ORDER — HEPARIN SOD (PORK) LOCK FLUSH 100 UNIT/ML IV SOLN
500.0000 [IU] | Freq: Once | INTRAVENOUS | Status: DC | PRN
  Filled 2020-05-19: qty 5

## 2020-05-19 MED ORDER — SODIUM CHLORIDE 0.9 % IV SOLN
Freq: Once | INTRAVENOUS | Status: AC
  Filled 2020-05-19: qty 250

## 2020-05-19 MED ORDER — DEXAMETHASONE 2 MG PO TABS: 2.0000 mg | ORAL_TABLET | Freq: Every day | ORAL | 1 refills | Status: DC

## 2020-05-19 MED ORDER — ONDANSETRON HCL 4 MG/2ML IJ SOLN
8.0000 mg | Freq: Once | INTRAMUSCULAR | Status: AC
  Administered 2020-05-19: 8 mg via INTRAVENOUS
  Filled 2020-05-19: qty 4

## 2020-05-19 MED ORDER — MORPHINE SULFATE (CONCENTRATE) 10 MG /0.5 ML PO SOLN: 5.0000 mg | Freq: Four times a day (QID) | ORAL | 0 refills | Status: DC | PRN

## 2020-05-19 MED ORDER — HEPARIN SOD (PORK) LOCK FLUSH 100 UNIT/ML IV SOLN
INTRAVENOUS | Status: AC
  Filled 2020-05-19: qty 5

## 2020-05-19 MED ORDER — HEPARIN SOD (PORK) LOCK FLUSH 100 UNIT/ML IV SOLN
500.0000 [IU] | Freq: Once | INTRAVENOUS | Status: AC
  Administered 2020-05-19: 500 [IU] via INTRAVENOUS
  Filled 2020-05-19: qty 5

## 2020-05-19 NOTE — Assessment & Plan Note (Addendum)
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 currently s/p 2 cycles  # HOLD cycle #3 cisplatin because of worsening renal function today; creatinine-1.4 [BL-0.9]. plan IVFs today.  Continue with radiation.  # acute renal failure- recommend holding chemo; plan IVFs. NO nephrotoxic agents.   # Mucositis- baking soda/salt water rinses/ therasol [MM-not covered]; discussed re: Morphine liquid.   # DM- on metformin; on dex 2 mg today. BG- STABLE today.   # DISPOSITION: # HOLD chemo today; IVFs over 1 hour. # on 12/14- labs-bmp; IVFs over1 hour # follow up in 1 week- MD; labs- cbc/cmp/mag; cisplatin- # follow up in 2 week- MD; labs- cbc/cmp/mag; cisplatin-Dr.B

## 2020-05-19 NOTE — Telephone Encounter (Signed)
PA submitted for patient's morphine via covermymeds. Josem Kaufmann pending   Joshua Bray (Key: [id number]) - [id number] Morphine Sulfate (Concentrate) 10MG /0.5ML solution     Status: Sent to Plan  Created: January 12th, 2022 [phone]  Sent: January 12th, 2022
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
## 2020-05-20 ENCOUNTER — Ambulatory Visit
Admission: RE | Admit: 2020-05-20 | Discharge: 2020-05-20 | Disposition: A | Discharge status: Home / Self Care | Payer: 59 | Source: Ambulatory Visit | Attending: Radiation Oncology | Admitting: Radiation Oncology

## 2020-05-20 ENCOUNTER — Inpatient Hospital Stay: Payer: 59

## 2020-05-20 DIAGNOSIS — Z5111 Encounter for antineoplastic chemotherapy: Secondary | ICD-10-CM | POA: Diagnosis not present

## 2020-05-20 NOTE — Progress Notes (Addendum)
Nutrition Follow-up:  Patient with base of the tongue cancer with bilateral lymphadenopathy.  Patient receiving concurrent chemotherapy and radiation therapy.   Met with patient following radiation. Reports that he is feeling much better and eating better after starting steroids yesterday.  Reports food has no taste. Can get a hint of vinegar flavored foods or tomato based foods but concerned that these may cause burning.  Reports drinking boost max protein shakes (30 g protein and 160 calories), soups, mostly liquids.     Medications: dexamethasone, nystatin, zofran, compazine, carafate  Labs: reviewed  Anthropometrics:   Weight 323 lb on 1/12 decreased from 343 lb on 12/28 340 lb on 12/3  6% weight loss in the last 2 weeks (12/28), sigificant   Estimated Energy Needs  Kcals: 0383-3383 Protein: 117-156 g Fluid: 3 L  NUTRITION DIAGNOSIS: Predicted sub optimal energy intake continues   INTERVENTION:  Provided complimentary case of ensure plus to patient. Discussed blending, pureeing foods for easy of chewing and swallowing Provided recipes for smoothies/soups.   Patient reports that cancer center is working to get him healios.   Discussed ways to add calories without increasing sugar.       MONITORING, EVALUATION, GOAL: weight trends, intake   NEXT VISIT: Thursday, Jan 20th  Joshua Bray, [place], [place] Registered Dietitian [phone] (mobile)

## 2020-05-21 ENCOUNTER — Inpatient Hospital Stay: Payer: 59

## 2020-05-21 ENCOUNTER — Ambulatory Visit
Admission: RE | Admit: 2020-05-21 | Discharge: 2020-05-21 | Disposition: A | Discharge status: Home / Self Care | Payer: 59 | Source: Ambulatory Visit | Attending: Radiation Oncology | Admitting: Radiation Oncology

## 2020-05-21 ENCOUNTER — Other Ambulatory Visit: Payer: Self-pay

## 2020-05-21 VITALS — BP 139/74 | HR 87 | Temp 99.0°F | Resp 20

## 2020-05-21 DIAGNOSIS — Z5111 Encounter for antineoplastic chemotherapy: Secondary | ICD-10-CM | POA: Diagnosis not present

## 2020-05-21 DIAGNOSIS — C01 Malignant neoplasm of base of tongue: Secondary | ICD-10-CM

## 2020-05-21 DIAGNOSIS — Z7189 Other specified counseling: Secondary | ICD-10-CM

## 2020-05-21 DIAGNOSIS — E860 Dehydration: Secondary | ICD-10-CM

## 2020-05-21 LAB — BASIC METABOLIC PANEL
Anion gap: 10 (ref 5–15)
BUN: 22 mg/dL — ABNORMAL HIGH (ref 6–20)
CO2: 26 mmol/L (ref 22–32)
Calcium: 9.3 mg/dL (ref 8.9–10.3)
Chloride: 100 mmol/L (ref 98–111)
Creatinine, Ser: 1.22 mg/dL (ref 0.61–1.24)
GFR, Estimated: >60 mL/min (ref >60)
Glucose, Bld: 129 mg/dL — ABNORMAL HIGH (ref 70–99)
Potassium: 3.7 mmol/L (ref 3.5–5.1)
Sodium: 136 mmol/L (ref 135–145)

## 2020-05-21 MED ORDER — SODIUM CHLORIDE 0.9 % IV SOLN
Freq: Once | INTRAVENOUS | Status: AC
  Filled 2020-05-21: qty 250

## 2020-05-21 MED ORDER — HEPARIN SOD (PORK) LOCK FLUSH 100 UNIT/ML IV SOLN
500.0000 [IU] | Freq: Once | INTRAVENOUS | Status: AC
  Administered 2020-05-21: 500 [IU] via INTRAVENOUS
  Filled 2020-05-21: qty 5

## 2020-05-21 MED ORDER — SODIUM CHLORIDE 0.9% FLUSH
10.0000 mL | INTRAVENOUS | Status: AC | PRN
  Administered 2020-05-21: 10 mL via INTRAVENOUS
  Filled 2020-05-21: qty 10

## 2020-05-21 NOTE — Progress Notes (Signed)
Mucositis and dry mouth are his major issues with radiation treatments. Receiving IV hydration today to supplement hydration. No complaints at discharge. Ambulatory.

## 2020-05-24 ENCOUNTER — Ambulatory Visit: Payer: 59

## 2020-05-24 NOTE — Telephone Encounter (Signed)
Reviewed chart and PA process within Covermymeds. No updates at this time with PA process for Morphine. Insurance approval still pending. [id number] am

## 2020-05-25 ENCOUNTER — Ambulatory Visit: Payer: 59

## 2020-05-25 NOTE — Telephone Encounter (Signed)
Morphine approved by insurance

## 2020-05-26 ENCOUNTER — Inpatient Hospital Stay: Payer: 59

## 2020-05-26 ENCOUNTER — Other Ambulatory Visit: Payer: Self-pay

## 2020-05-26 ENCOUNTER — Inpatient Hospital Stay (HOSPITAL_BASED_OUTPATIENT_CLINIC_OR_DEPARTMENT_OTHER): Payer: 59 | Admitting: Internal Medicine

## 2020-05-26 ENCOUNTER — Encounter: Payer: Self-pay | Admitting: Internal Medicine

## 2020-05-26 ENCOUNTER — Ambulatory Visit
Admission: RE | Admit: 2020-05-26 | Discharge: 2020-05-26 | Disposition: A | Discharge status: Home / Self Care | Payer: 59 | Source: Ambulatory Visit | Attending: Radiation Oncology | Admitting: Radiation Oncology

## 2020-05-26 DIAGNOSIS — Z5111 Encounter for antineoplastic chemotherapy: Secondary | ICD-10-CM | POA: Diagnosis not present

## 2020-05-26 DIAGNOSIS — C01 Malignant neoplasm of base of tongue: Secondary | ICD-10-CM

## 2020-05-26 DIAGNOSIS — Z7189 Other specified counseling: Secondary | ICD-10-CM

## 2020-05-26 LAB — CBC WITH DIFFERENTIAL/PLATELET
Abs Immature Granulocytes: 0.02 10*3/uL (ref 0.00–0.07)
Basophils Absolute: 0 10*3/uL (ref 0.0–0.1)
Basophils Relative: 1 %
Eosinophils Absolute: 0.1 10*3/uL (ref 0.0–0.5)
Eosinophils Relative: 1 %
HCT: 40.7 % (ref 39.0–52.0)
Hemoglobin: 14.7 g/dL (ref 13.0–17.0)
Immature Granulocytes: 0 %
Lymphocytes Relative: 14 %
Lymphs Abs: 0.8 10*3/uL (ref 0.7–4.0)
MCH: 32.7 pg (ref 26.0–34.0)
MCHC: 36.1 g/dL — ABNORMAL HIGH (ref 30.0–36.0)
MCV: 90.6 fL (ref 80.0–100.0)
Monocytes Absolute: 0.7 10*3/uL (ref 0.1–1.0)
Monocytes Relative: 12 %
Neutro Abs: 4.1 10*3/uL (ref 1.7–7.7)
Neutrophils Relative %: 72 %
Platelets: 224 10*3/uL (ref 150–400)
RBC: 4.49 MIL/uL (ref 4.22–5.81)
RDW: 12.6 % (ref 11.5–15.5)
WBC: 5.7 10*3/uL (ref 4.0–10.5)
nRBC: 0 % (ref 0.0–0.2)

## 2020-05-26 LAB — MAGNESIUM: Magnesium: 2 mg/dL (ref 1.7–2.4)

## 2020-05-26 LAB — BASIC METABOLIC PANEL
Anion gap: 10 (ref 5–15)
BUN: 21 mg/dL — ABNORMAL HIGH (ref 6–20)
CO2: 25 mmol/L (ref 22–32)
Calcium: 9.1 mg/dL (ref 8.9–10.3)
Chloride: 101 mmol/L (ref 98–111)
Creatinine, Ser: 1.18 mg/dL (ref 0.61–1.24)
GFR, Estimated: >60 mL/min (ref >60)
Glucose, Bld: 172 mg/dL — ABNORMAL HIGH (ref 70–99)
Potassium: 3.7 mmol/L (ref 3.5–5.1)
Sodium: 136 mmol/L (ref 135–145)

## 2020-05-26 MED ORDER — POTASSIUM CHLORIDE IN NACL 20-0.9 MEQ/L-% IV SOLN
Freq: Once | INTRAVENOUS | Status: AC
  Filled 2020-05-26: qty 1000

## 2020-05-26 MED ORDER — SODIUM CHLORIDE 0.9 % IV SOLN
10.0000 mg | Freq: Once | INTRAVENOUS | Status: AC
  Administered 2020-05-26: 10 mg via INTRAVENOUS
  Filled 2020-05-26: qty 10

## 2020-05-26 MED ORDER — PALONOSETRON HCL INJECTION 0.25 MG/5ML
0.2500 mg | Freq: Once | INTRAVENOUS | Status: AC
  Administered 2020-05-26: 0.25 mg via INTRAVENOUS
  Filled 2020-05-26: qty 5

## 2020-05-26 MED ORDER — SODIUM CHLORIDE 0.9 % IV SOLN
150.0000 mg | Freq: Once | INTRAVENOUS | Status: AC
  Administered 2020-05-26: 150 mg via INTRAVENOUS
  Filled 2020-05-26: qty 150

## 2020-05-26 MED ORDER — HEPARIN SOD (PORK) LOCK FLUSH 100 UNIT/ML IV SOLN
500.0000 [IU] | Freq: Once | INTRAVENOUS | Status: AC | PRN
  Administered 2020-05-26: 500 [IU]
  Filled 2020-05-26: qty 5

## 2020-05-26 MED ORDER — SODIUM CHLORIDE 0.9 % IV SOLN
Freq: Once | INTRAVENOUS | Status: AC
  Filled 2020-05-26: qty 250

## 2020-05-26 MED ORDER — SODIUM CHLORIDE 0.9% FLUSH
10.0000 mL | Freq: Once | INTRAVENOUS | Status: AC
  Administered 2020-05-26: 10 mL via INTRAVENOUS
  Filled 2020-05-26: qty 10

## 2020-05-26 MED ORDER — MAGNESIUM SULFATE 2 GM/50ML IV SOLN
2.0000 g | Freq: Once | INTRAVENOUS | Status: AC
  Administered 2020-05-26: 2 g via INTRAVENOUS
  Filled 2020-05-26: qty 50

## 2020-05-26 MED ORDER — HEPARIN SOD (PORK) LOCK FLUSH 100 UNIT/ML IV SOLN
INTRAVENOUS | Status: AC
  Filled 2020-05-26: qty 5

## 2020-05-26 MED ORDER — SODIUM CHLORIDE 0.9 % IV SOLN
40.0000 mg/m2 | Freq: Once | INTRAVENOUS | Status: AC
  Administered 2020-05-26: 111 mg via INTRAVENOUS
  Filled 2020-05-26: qty 111

## 2020-05-26 NOTE — Assessment & Plan Note (Addendum)
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 currently s/p 2 cycles  # proceed with cycle #3 cisplatin; Labs today reviewed;  acceptable for treatment today- creatinine-1.18; Continue with radiation.  Discussed that patient will need 6-8 cisplatin weekly treatments.  # acute renal failure- sec to cisplatin-improved; creatinine-1.18; plan IVFs 2 days post chemo. NO nephrotoxic agents.   # Mucositis- baking soda/salt water rinses/ therasol [MM-not covered]; discussed re: Morphine liquid; hold dex.   # DM- on metformin; on dex 2 mg today. BG-172;  STABLE today.   # DISPOSITION: # chemo today. # in 2 days IVFs over1 hour # follow up in 1 week- MD; labs- cbc/cmp/mag; cisplatin- # follow up in 2 week- MD; labs- cbc/cmp/mag; cisplatin-Dr.B
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
## 2020-05-27 ENCOUNTER — Inpatient Hospital Stay: Payer: 59

## 2020-05-27 ENCOUNTER — Ambulatory Visit
Admission: RE | Admit: 2020-05-27 | Discharge: 2020-05-27 | Disposition: A | Discharge status: Home / Self Care | Payer: 59 | Source: Ambulatory Visit | Attending: Radiation Oncology | Admitting: Radiation Oncology

## 2020-05-27 DIAGNOSIS — Z5111 Encounter for antineoplastic chemotherapy: Secondary | ICD-10-CM | POA: Diagnosis not present

## 2020-05-27 NOTE — Progress Notes (Signed)
Nutrition Follow-up:   Patient with base of tongue cancer with bilateral lymphadenopathy.  Patient receiving concurrent chemotherapy and radiation.   Met with patient following radiation.  Patient reports that he is eating mostly soups and drinking shakes.  He is blending foods at times.  Drinking about 3 ensure plus per day. Food has no taste.  Has been using healios BID.      Medications: reviewed  Labs: reviewed  Anthropometrics:   Weight today per [facility] 323 lb today  319 lb 1/19 (medical oncology) 343 lb on 12/28 340 lb on 12/3   NUTRITION DIAGNOSIS: Predicted suboptimal energy intake continues   INTERVENTION:  Provided complimentary case of ensure plus to patient today. Reviewed recommended calories and protein needed per day Encouraged increase in oral nutrition supplements and overall calories.  Continue healios. Weight stable this week but if continues weight loss would recommend considering feeding tube placement through treatment.  Patient has contact information    MONITORING, EVALUATION, GOAL: weight trends, intake   NEXT VISIT: Feb 3rd  Kari Kerth B. Zenia Resides, [place], [place] Registered Dietitian [phone] (mobile)

## 2020-05-28 ENCOUNTER — Ambulatory Visit
Admission: RE | Admit: 2020-05-28 | Discharge: 2020-05-28 | Disposition: A | Discharge status: Home / Self Care | Payer: 59 | Source: Ambulatory Visit | Attending: Radiation Oncology | Admitting: Radiation Oncology

## 2020-05-28 ENCOUNTER — Inpatient Hospital Stay: Payer: 59

## 2020-05-28 VITALS — BP 149/65 | HR 85 | Temp 98.0°F | Resp 19 | Wt 319.8 lb

## 2020-05-28 DIAGNOSIS — C01 Malignant neoplasm of base of tongue: Secondary | ICD-10-CM

## 2020-05-28 DIAGNOSIS — Z5111 Encounter for antineoplastic chemotherapy: Secondary | ICD-10-CM | POA: Diagnosis not present

## 2020-05-28 MED ORDER — SODIUM CHLORIDE 0.9% FLUSH
10.0000 mL | Freq: Once | INTRAVENOUS | Status: AC | PRN
  Administered 2020-05-28: 10 mL
  Filled 2020-05-28: qty 10

## 2020-05-28 MED ORDER — SODIUM CHLORIDE 0.9 % IV SOLN
Freq: Once | INTRAVENOUS | Status: AC
  Filled 2020-05-28: qty 250

## 2020-05-28 MED ORDER — ONDANSETRON HCL 4 MG/2ML IJ SOLN
8.0000 mg | Freq: Once | INTRAMUSCULAR | Status: AC
  Administered 2020-05-28: 8 mg via INTRAVENOUS

## 2020-05-28 MED ORDER — HEPARIN SOD (PORK) LOCK FLUSH 100 UNIT/ML IV SOLN
500.0000 [IU] | Freq: Once | INTRAVENOUS | Status: AC | PRN
  Administered 2020-05-28: 500 [IU]
  Filled 2020-05-28: qty 5

## 2020-05-28 MED ORDER — SODIUM CHLORIDE 0.9 % IV SOLN: Freq: Once | INTRAVENOUS | Status: DC

## 2020-05-28 NOTE — Progress Notes (Signed)
Pt received IV hydration with nausea medication. He offers no complaints at discharge from clinic. Ambulatory.

## 2020-05-31 ENCOUNTER — Ambulatory Visit
Admission: RE | Admit: 2020-05-31 | Discharge: 2020-05-31 | Disposition: A | Discharge status: Home / Self Care | Payer: 59 | Source: Ambulatory Visit | Attending: Radiation Oncology | Admitting: Radiation Oncology

## 2020-05-31 ENCOUNTER — Ambulatory Visit: Payer: 59

## 2020-05-31 DIAGNOSIS — Z5111 Encounter for antineoplastic chemotherapy: Secondary | ICD-10-CM | POA: Diagnosis not present

## 2020-06-01 ENCOUNTER — Encounter: Payer: Self-pay | Admitting: Internal Medicine

## 2020-06-01 ENCOUNTER — Other Ambulatory Visit: Payer: Self-pay

## 2020-06-01 ENCOUNTER — Ambulatory Visit
Admission: RE | Admit: 2020-06-01 | Discharge: 2020-06-01 | Disposition: A | Discharge status: Home / Self Care | Payer: 59 | Source: Ambulatory Visit | Attending: Radiation Oncology | Admitting: Radiation Oncology

## 2020-06-01 DIAGNOSIS — Z5111 Encounter for antineoplastic chemotherapy: Secondary | ICD-10-CM | POA: Diagnosis not present

## 2020-06-01 NOTE — Progress Notes (Signed)
Patient states that his bp at home was 120/80. C/o dry throat.

## 2020-06-02 ENCOUNTER — Inpatient Hospital Stay: Payer: 59

## 2020-06-02 ENCOUNTER — Inpatient Hospital Stay (HOSPITAL_BASED_OUTPATIENT_CLINIC_OR_DEPARTMENT_OTHER): Payer: 59 | Admitting: Internal Medicine

## 2020-06-02 ENCOUNTER — Ambulatory Visit
Admission: RE | Admit: 2020-06-02 | Discharge: 2020-06-02 | Disposition: A | Discharge status: Home / Self Care | Payer: 59 | Source: Ambulatory Visit | Attending: Radiation Oncology | Admitting: Radiation Oncology

## 2020-06-02 ENCOUNTER — Encounter: Payer: Self-pay | Admitting: Internal Medicine

## 2020-06-02 DIAGNOSIS — C01 Malignant neoplasm of base of tongue: Secondary | ICD-10-CM

## 2020-06-02 DIAGNOSIS — Z5111 Encounter for antineoplastic chemotherapy: Secondary | ICD-10-CM | POA: Diagnosis not present

## 2020-06-02 DIAGNOSIS — Z7189 Other specified counseling: Secondary | ICD-10-CM

## 2020-06-02 LAB — COMPREHENSIVE METABOLIC PANEL
ALT: 26 U/L (ref 0–44)
AST: 23 U/L (ref 15–41)
Albumin: 3.3 g/dL — ABNORMAL LOW (ref 3.5–5.0)
Alkaline Phosphatase: 95 U/L (ref 38–126)
Anion gap: 9 (ref 5–15)
BUN: 20 mg/dL (ref 6–20)
CO2: 27 mmol/L (ref 22–32)
Calcium: 9.3 mg/dL (ref 8.9–10.3)
Chloride: 98 mmol/L (ref 98–111)
Creatinine, Ser: 1.12 mg/dL (ref 0.61–1.24)
GFR, Estimated: >60 mL/min (ref >60)
Glucose, Bld: 163 mg/dL — ABNORMAL HIGH (ref 70–99)
Potassium: 4 mmol/L (ref 3.5–5.1)
Sodium: 134 mmol/L — ABNORMAL LOW (ref 135–145)
Total Bilirubin: 0.6 mg/dL (ref 0.3–1.2)
Total Protein: 7.3 g/dL (ref 6.5–8.1)

## 2020-06-02 LAB — CBC WITH DIFFERENTIAL/PLATELET
Abs Immature Granulocytes: 0.02 10*3/uL (ref 0.00–0.07)
Basophils Absolute: 0 10*3/uL (ref 0.0–0.1)
Basophils Relative: 1 %
Eosinophils Absolute: 0.1 10*3/uL (ref 0.0–0.5)
Eosinophils Relative: 2 %
HCT: 40 % (ref 39.0–52.0)
Hemoglobin: 14.3 g/dL (ref 13.0–17.0)
Immature Granulocytes: 1 %
Lymphocytes Relative: 19 %
Lymphs Abs: 0.8 10*3/uL (ref 0.7–4.0)
MCH: 32.2 pg (ref 26.0–34.0)
MCHC: 35.8 g/dL (ref 30.0–36.0)
MCV: 90.1 fL (ref 80.0–100.0)
Monocytes Absolute: 0.6 10*3/uL (ref 0.1–1.0)
Monocytes Relative: 15 %
Neutro Abs: 2.7 10*3/uL (ref 1.7–7.7)
Neutrophils Relative %: 62 %
Platelets: 161 10*3/uL (ref 150–400)
RBC: 4.44 MIL/uL (ref 4.22–5.81)
RDW: 12.3 % (ref 11.5–15.5)
WBC: 4.2 10*3/uL (ref 4.0–10.5)
nRBC: 0 % (ref 0.0–0.2)

## 2020-06-02 LAB — MAGNESIUM: Magnesium: 2 mg/dL (ref 1.7–2.4)

## 2020-06-02 MED ORDER — SODIUM CHLORIDE 0.9 % IV SOLN
Freq: Once | INTRAVENOUS | Status: AC
  Filled 2020-06-02: qty 250

## 2020-06-02 MED ORDER — HEPARIN SOD (PORK) LOCK FLUSH 100 UNIT/ML IV SOLN
500.0000 [IU] | Freq: Once | INTRAVENOUS | Status: AC | PRN
  Administered 2020-06-02: 500 [IU]
  Filled 2020-06-02: qty 5

## 2020-06-02 MED ORDER — PALONOSETRON HCL INJECTION 0.25 MG/5ML
0.2500 mg | Freq: Once | INTRAVENOUS | Status: AC
  Administered 2020-06-02: 0.25 mg via INTRAVENOUS
  Filled 2020-06-02: qty 5

## 2020-06-02 MED ORDER — SODIUM CHLORIDE 0.9 % IV SOLN
10.0000 mg | Freq: Once | INTRAVENOUS | Status: AC
  Administered 2020-06-02: 10 mg via INTRAVENOUS
  Filled 2020-06-02: qty 10

## 2020-06-02 MED ORDER — POTASSIUM CHLORIDE IN NACL 20-0.9 MEQ/L-% IV SOLN
Freq: Once | INTRAVENOUS | Status: AC
  Filled 2020-06-02: qty 1000

## 2020-06-02 MED ORDER — SODIUM CHLORIDE 0.9 % IV SOLN
40.0000 mg/m2 | Freq: Once | INTRAVENOUS | Status: AC
  Administered 2020-06-02: 111 mg via INTRAVENOUS
  Filled 2020-06-02: qty 111

## 2020-06-02 MED ORDER — MAGNESIUM SULFATE 2 GM/50ML IV SOLN
2.0000 g | Freq: Once | INTRAVENOUS | Status: AC
  Administered 2020-06-02: 2 g via INTRAVENOUS
  Filled 2020-06-02: qty 50

## 2020-06-02 MED ORDER — SODIUM CHLORIDE 0.9 % IV SOLN
150.0000 mg | Freq: Once | INTRAVENOUS | Status: AC
  Administered 2020-06-02: 150 mg via INTRAVENOUS
  Filled 2020-06-02: qty 150

## 2020-06-02 NOTE — Progress Notes (Signed)
Patient stable at discharge.
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
## 2020-06-02 NOTE — Progress Notes (Signed)
Having some nausea this am. Wanted to talk to you about that.

## 2020-06-02 NOTE — Assessment & Plan Note (Addendum)
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   # proceed with weekly cycle #4 cisplatin; Labs today reviewed;  acceptable for treatment today- creatinine-1.12; Continue with radiation.  Continue IV fluids day 2 post chemotherapy.  Discussed with the patient that we will plan total of 6-8 cycles of chemotherapy along with radiation.  Discussed we will plan to repeat imaging 8 to 12 weeks post chemoradiation for reassessment.  If patient has residual disease-surgery could be considered.  # Nausea/gagging- sec to cisplatin- continue zofran/compazine prn.   # acute renal failure- sec to cisplatin-improved; creatinine-1.12; plan IVFs 2 days post chemo. NO nephrotoxic agents.   # Constipation sec to zofran- plan Miralax BID.   # Mucositis- baking soda/salt water rinses/ therasol [MM-not covered]; discussed re: Morphine liquid-STABLE.   # DM- on metformin; OFF dex 2 mg today. BG-163-STABLE;   # DISPOSITION: # chemo today. # in 2 days IVFs over1 hour # follow up in 1 week- MD; labs- cbc/cmp/mag; cisplatin- # follow up in 2 week- MD; labs- cbc/cmp/mag; cisplatin-Dr.B

## 2020-06-03 ENCOUNTER — Ambulatory Visit: Payer: 59

## 2020-06-03 ENCOUNTER — Ambulatory Visit
Admission: RE | Admit: 2020-06-03 | Discharge: 2020-06-03 | Disposition: A | Discharge status: Home / Self Care | Payer: 59 | Source: Ambulatory Visit | Attending: Radiation Oncology | Admitting: Radiation Oncology

## 2020-06-03 DIAGNOSIS — Z5111 Encounter for antineoplastic chemotherapy: Secondary | ICD-10-CM | POA: Diagnosis not present

## 2020-06-03 NOTE — Progress Notes (Addendum)
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  Patient receiving concurrent chemotherapy and radiation.   Met with patient following radiation today.  Patient reports issues with dry mouth and thick saliva.  Has been using baking soda/salt water rinses.  Drinking 4-5 1/2 cartons of ensure.  Patient mixes with apple juice to thin down.  Says that chicken broth is soothing and helps break up thick mucus.  Reports some issues with nausea    Medications: reviewed  Labs: reviewed  Anthropometrics:   Weight 311 lb (medical oncology visit on 1/26)  316 lb today in radiation likely fluids from treatment  323 lb per [facility] 1/20 319 lb 1/19 (medical oncology) 343 lb on 12/28 340 lb 12/3  Patient would like to get under 300 lbs  NUTRITION DIAGNOSIS: Predicted sub optimal energy intake continues   INTERVENTION:  Discussed importance of weight maintenance, maintaining muscle mass during treatment.  Goal would be for patient to drink at least 6 ensure plus shakes daily and add additional 60 g protein per day (has 30 g protein shakes as well).  (2420 calories 138 g protein). Patient adding calories to shakes as well.  Discussed strategies to help with dry mouth. Handout provided. Patient verbalized to RD that he did not want to get a feeding tube during treatment.   Provided complimentary case of ensure plus to patient today Patient asking about options to be more physically active and maintain/build muscle mass.  Patient may be candidate for CARE Program will discussed with MD.       MONITORING, EVALUATION, GOAL: weight trends, intake   NEXT VISIT: Feb 3rd after radiation  Atlee Villers B. Zenia Resides, [place], [place] Registered Dietitian [phone] (mobile)

## 2020-06-04 ENCOUNTER — Telehealth: Payer: Self-pay | Admitting: *Deleted

## 2020-06-04 ENCOUNTER — Inpatient Hospital Stay: Payer: 59

## 2020-06-04 ENCOUNTER — Ambulatory Visit
Admission: RE | Admit: 2020-06-04 | Discharge: 2020-06-04 | Disposition: A | Discharge status: Home / Self Care | Payer: 59 | Source: Ambulatory Visit | Attending: Radiation Oncology | Admitting: Radiation Oncology

## 2020-06-04 ENCOUNTER — Other Ambulatory Visit: Payer: Self-pay

## 2020-06-04 VITALS — BP 140/81 | HR 79 | Temp 97.4°F | Resp 18

## 2020-06-04 DIAGNOSIS — Z5111 Encounter for antineoplastic chemotherapy: Secondary | ICD-10-CM | POA: Diagnosis not present

## 2020-06-04 DIAGNOSIS — C01 Malignant neoplasm of base of tongue: Secondary | ICD-10-CM

## 2020-06-04 MED ORDER — SODIUM CHLORIDE 0.9 % IV SOLN
Freq: Once | INTRAVENOUS | Status: AC
  Filled 2020-06-04: qty 250

## 2020-06-04 MED ORDER — HEPARIN SOD (PORK) LOCK FLUSH 100 UNIT/ML IV SOLN
500.0000 [IU] | Freq: Once | INTRAVENOUS | Status: AC | PRN
  Administered 2020-06-04: 500 [IU]
  Filled 2020-06-04: qty 5

## 2020-06-04 MED ORDER — SODIUM CHLORIDE 0.9% FLUSH
10.0000 mL | Freq: Once | INTRAVENOUS | Status: AC | PRN
  Administered 2020-06-04: 10 mL
  Filled 2020-06-04: qty 10

## 2020-06-04 NOTE — Telephone Encounter (Signed)
Dr. Jacinto Reap- please advise on CARE program request.

## 2020-06-04 NOTE — Progress Notes (Signed)
Pt states moderate amount of throat pain. He has a regimen of meticulous mouthcare Through out the 24 hour day. He uses baking soda rinses and rinses and swallows about an ounce of warm chicken broth which he says is very soothing. He drinks ensure for his meals. Drinking water and hot green tea. Getting all of his meds down. Takes miralax for constipation with relief.Received IV hydration today.

## 2020-06-04 NOTE — Telephone Encounter (Signed)
-----  Message from Joli Allen, RD sent at 06/03/2020  1:49 PM EST ----- Dr B,  Met with Mr Treloar today, noted weight loss. We talked about ways to increase calories and protein. He said that he does not want a feeding tube.  I stressed to him the importance of slowing down weight loss, maintaining muscle mass during treatment and keeping him well nourished so that he does not miss a radiation or chemo treatment.  He asked about options for exercise program to maintain muscle mass.  I am not sure if he would be a candidate for the CARE Program.   If you agree, will you place the referral?    Will follow up with him next week.  Joli    

## 2020-06-07 ENCOUNTER — Ambulatory Visit: Payer: 59

## 2020-06-07 NOTE — Telephone Encounter (Signed)
Thanks, Joli for the update.  T- please make a referral to Care program. GB

## 2020-06-07 NOTE — Telephone Encounter (Signed)
Referral placed.

## 2020-06-08 ENCOUNTER — Ambulatory Visit
Admission: RE | Admit: 2020-06-08 | Discharge: 2020-06-08 | Disposition: A | Discharge status: Home / Self Care | Payer: 59 | Source: Ambulatory Visit | Attending: Radiation Oncology | Admitting: Radiation Oncology

## 2020-06-08 DIAGNOSIS — Z510 Encounter for antineoplastic radiation therapy: Secondary | ICD-10-CM | POA: Diagnosis not present

## 2020-06-08 DIAGNOSIS — K1230 Oral mucositis (ulcerative), unspecified: Secondary | ICD-10-CM | POA: Diagnosis not present

## 2020-06-08 DIAGNOSIS — E876 Hypokalemia: Secondary | ICD-10-CM | POA: Diagnosis not present

## 2020-06-08 DIAGNOSIS — N179 Acute kidney failure, unspecified: Secondary | ICD-10-CM | POA: Diagnosis not present

## 2020-06-08 DIAGNOSIS — Z5189 Encounter for other specified aftercare: Secondary | ICD-10-CM | POA: Diagnosis not present

## 2020-06-08 DIAGNOSIS — R682 Dry mouth, unspecified: Secondary | ICD-10-CM | POA: Diagnosis not present

## 2020-06-08 DIAGNOSIS — C01 Malignant neoplasm of base of tongue: Secondary | ICD-10-CM | POA: Insufficient documentation

## 2020-06-08 DIAGNOSIS — Z5111 Encounter for antineoplastic chemotherapy: Secondary | ICD-10-CM | POA: Diagnosis present

## 2020-06-08 DIAGNOSIS — R634 Abnormal weight loss: Secondary | ICD-10-CM | POA: Diagnosis not present

## 2020-06-09 ENCOUNTER — Other Ambulatory Visit: Payer: Self-pay

## 2020-06-09 ENCOUNTER — Inpatient Hospital Stay: Payer: 59 | Attending: Internal Medicine

## 2020-06-09 ENCOUNTER — Ambulatory Visit
Admission: RE | Admit: 2020-06-09 | Discharge: 2020-06-09 | Disposition: A | Discharge status: Home / Self Care | Payer: 59 | Source: Ambulatory Visit | Attending: Radiation Oncology | Admitting: Radiation Oncology

## 2020-06-09 ENCOUNTER — Inpatient Hospital Stay: Payer: 59

## 2020-06-09 ENCOUNTER — Encounter: Payer: Self-pay | Admitting: Internal Medicine

## 2020-06-09 ENCOUNTER — Inpatient Hospital Stay (HOSPITAL_BASED_OUTPATIENT_CLINIC_OR_DEPARTMENT_OTHER): Payer: 59 | Admitting: Internal Medicine

## 2020-06-09 DIAGNOSIS — C01 Malignant neoplasm of base of tongue: Secondary | ICD-10-CM | POA: Insufficient documentation

## 2020-06-09 DIAGNOSIS — Z5111 Encounter for antineoplastic chemotherapy: Secondary | ICD-10-CM | POA: Insufficient documentation

## 2020-06-09 DIAGNOSIS — K1230 Oral mucositis (ulcerative), unspecified: Secondary | ICD-10-CM | POA: Insufficient documentation

## 2020-06-09 DIAGNOSIS — R682 Dry mouth, unspecified: Secondary | ICD-10-CM | POA: Insufficient documentation

## 2020-06-09 DIAGNOSIS — E876 Hypokalemia: Secondary | ICD-10-CM | POA: Insufficient documentation

## 2020-06-09 DIAGNOSIS — Z5189 Encounter for other specified aftercare: Secondary | ICD-10-CM | POA: Insufficient documentation

## 2020-06-09 DIAGNOSIS — R634 Abnormal weight loss: Secondary | ICD-10-CM | POA: Insufficient documentation

## 2020-06-09 DIAGNOSIS — Z510 Encounter for antineoplastic radiation therapy: Secondary | ICD-10-CM | POA: Insufficient documentation

## 2020-06-09 DIAGNOSIS — N179 Acute kidney failure, unspecified: Secondary | ICD-10-CM | POA: Insufficient documentation

## 2020-06-09 DIAGNOSIS — Z7189 Other specified counseling: Secondary | ICD-10-CM

## 2020-06-09 LAB — COMPREHENSIVE METABOLIC PANEL
ALT: 23 U/L (ref 0–44)
AST: 25 U/L (ref 15–41)
Albumin: 3.4 g/dL — ABNORMAL LOW (ref 3.5–5.0)
Alkaline Phosphatase: 101 U/L (ref 38–126)
Anion gap: 12 (ref 5–15)
BUN: 15 mg/dL (ref 6–20)
CO2: 25 mmol/L (ref 22–32)
Calcium: 9.1 mg/dL (ref 8.9–10.3)
Chloride: 99 mmol/L (ref 98–111)
Creatinine, Ser: 1.18 mg/dL (ref 0.61–1.24)
GFR, Estimated: >60 mL/min (ref >60)
Glucose, Bld: 216 mg/dL — ABNORMAL HIGH (ref 70–99)
Potassium: 4 mmol/L (ref 3.5–5.1)
Sodium: 136 mmol/L (ref 135–145)
Total Bilirubin: 0.7 mg/dL (ref 0.3–1.2)
Total Protein: 6.9 g/dL (ref 6.5–8.1)

## 2020-06-09 LAB — MAGNESIUM: Magnesium: 1.7 mg/dL (ref 1.7–2.4)

## 2020-06-09 LAB — CBC WITH DIFFERENTIAL/PLATELET
Abs Immature Granulocytes: 0.01 10*3/uL (ref 0.00–0.07)
Basophils Absolute: 0 10*3/uL (ref 0.0–0.1)
Basophils Relative: 1 %
Eosinophils Absolute: 0 10*3/uL (ref 0.0–0.5)
Eosinophils Relative: 1 %
HCT: 38.5 % — ABNORMAL LOW (ref 39.0–52.0)
Hemoglobin: 13.9 g/dL (ref 13.0–17.0)
Immature Granulocytes: 0 %
Lymphocytes Relative: 17 %
Lymphs Abs: 0.5 10*3/uL — ABNORMAL LOW (ref 0.7–4.0)
MCH: 32.6 pg (ref 26.0–34.0)
MCHC: 36.1 g/dL — ABNORMAL HIGH (ref 30.0–36.0)
MCV: 90.2 fL (ref 80.0–100.0)
Monocytes Absolute: 0.4 10*3/uL (ref 0.1–1.0)
Monocytes Relative: 13 %
Neutro Abs: 2.2 10*3/uL (ref 1.7–7.7)
Neutrophils Relative %: 68 %
Platelets: 160 10*3/uL (ref 150–400)
RBC: 4.27 MIL/uL (ref 4.22–5.81)
RDW: 12.6 % (ref 11.5–15.5)
WBC: 3.3 10*3/uL — ABNORMAL LOW (ref 4.0–10.5)
nRBC: 0 % (ref 0.0–0.2)

## 2020-06-09 MED ORDER — HEPARIN SOD (PORK) LOCK FLUSH 100 UNIT/ML IV SOLN
INTRAVENOUS | Status: AC
  Filled 2020-06-09: qty 5

## 2020-06-09 MED ORDER — SODIUM CHLORIDE 0.9 % IV SOLN
Freq: Once | INTRAVENOUS | Status: AC
  Filled 2020-06-09: qty 250

## 2020-06-09 MED ORDER — HEPARIN SOD (PORK) LOCK FLUSH 100 UNIT/ML IV SOLN
500.0000 [IU] | Freq: Once | INTRAVENOUS | Status: AC
  Administered 2020-06-09: 500 [IU] via INTRAVENOUS
  Filled 2020-06-09: qty 5

## 2020-06-09 MED ORDER — MAGNESIUM SULFATE 2 GM/50ML IV SOLN
2.0000 g | Freq: Once | INTRAVENOUS | Status: AC
  Administered 2020-06-09: 2 g via INTRAVENOUS
  Filled 2020-06-09: qty 50

## 2020-06-09 MED ORDER — SODIUM CHLORIDE 0.9% FLUSH
10.0000 mL | Freq: Once | INTRAVENOUS | Status: AC
  Administered 2020-06-09: 10 mL via INTRAVENOUS
  Filled 2020-06-09: qty 10

## 2020-06-09 MED ORDER — POTASSIUM CHLORIDE IN NACL 20-0.9 MEQ/L-% IV SOLN
Freq: Once | INTRAVENOUS | Status: AC
  Filled 2020-06-09: qty 1000

## 2020-06-09 MED ORDER — SODIUM CHLORIDE 0.9 % IV SOLN
150.0000 mg | Freq: Once | INTRAVENOUS | Status: AC
  Administered 2020-06-09: 150 mg via INTRAVENOUS
  Filled 2020-06-09: qty 150

## 2020-06-09 MED ORDER — SODIUM CHLORIDE 0.9 % IV SOLN
40.0000 mg/m2 | Freq: Once | INTRAVENOUS | Status: AC
  Administered 2020-06-09: 111 mg via INTRAVENOUS
  Filled 2020-06-09: qty 111

## 2020-06-09 MED ORDER — SODIUM CHLORIDE 0.9 % IV SOLN
10.0000 mg | Freq: Once | INTRAVENOUS | Status: AC
  Administered 2020-06-09: 10 mg via INTRAVENOUS
  Filled 2020-06-09: qty 10

## 2020-06-09 MED ORDER — PALONOSETRON HCL INJECTION 0.25 MG/5ML
0.2500 mg | Freq: Once | INTRAVENOUS | Status: AC
  Administered 2020-06-09: 0.25 mg via INTRAVENOUS
  Filled 2020-06-09: qty 5

## 2020-06-09 NOTE — Progress Notes (Signed)
Stable at discharge 
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
## 2020-06-09 NOTE — Assessment & Plan Note (Addendum)
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   # proceed with weekly cycle #5. cisplatin; Labs today reviewed;  acceptable for treatment today- creatinine-1.18; Continue with radiation.   # Nausea/gagging- sec to cisplatin- continue zofran/compazine prn.   # Constipation: improved with mirlaax/ glycerine suppository.   # acute renal failure- sec to cisplatin-improved; creatinine-1.12; plan IVFs 2 days post chemo. NO nephrotoxic agents.   # Mucositis- baking soda/salt water rinses/ therasol [MM-not covered]; discussed re: Morphine liquid- STABLE.  # DM- on metformin; OFF dex 2 mg today. BG-163-STABLE.    # DISPOSITION: # chemo today. # in 2 days IVFs over1 hour # follow up in 1 week- MD; labs- cbc/cmp/mag; cisplatin- # follow up in 2 week- MD; labs- cbc/cmp/mag; cisplatin-Dr.B

## 2020-06-10 ENCOUNTER — Ambulatory Visit
Admission: RE | Admit: 2020-06-10 | Discharge: 2020-06-10 | Disposition: A | Discharge status: Home / Self Care | Payer: 59 | Source: Ambulatory Visit | Attending: Radiation Oncology | Admitting: Radiation Oncology

## 2020-06-10 ENCOUNTER — Inpatient Hospital Stay: Payer: 59

## 2020-06-10 DIAGNOSIS — Z5111 Encounter for antineoplastic chemotherapy: Secondary | ICD-10-CM | POA: Diagnosis not present

## 2020-06-10 NOTE — Progress Notes (Signed)
Nutrition Follow-up:  Patient with base of the tongue cancer with bilateral lymphadenopathy.  Patient receiving concurrent chemotherapy and radiation.   Met with patient following radiation.  Patient reports that he had issues with constipation this past week that decreased appetite/intake.  Has had a bowel movement on Monday and been using miralax to help.  Reports mucus in back of throat and no taste are biggest things effecting intake.  Reports that he has been able to eat scrambled eggs and sausage this week. Continues to drink ensure shakes mixed in smoothie.  Smell of ensure alone makes him sick.      Medications: reviewed  Labs: reviewed  Anthropometrics:   Weight 308 lb on 2/2  311 lb 1/16 323 lb 1/19 343 lb on 12/28 340 lb on 12/3   NUTRITION DIAGNOSIS: Predicted suboptimal energy intake continues    INTERVENTION:  Discussed with patient that referral to [facility] was sent.  Discussed program with patient and encouraged him to participate. Patient planning to try canned fish (salmon, sardines) for more protein this week. Continue bowel regimen to prevent constipation Use lid with ensure shakes to decrease smell Continue rinses to help with mucositis.      MONITORING, EVALUATION, GOAL: weight trends, intake   NEXT VISIT: Feb 10 after radiation  Anetha Slagel B. Zenia Resides, [place], [place] Registered Dietitian [phone] (mobile)

## 2020-06-11 ENCOUNTER — Inpatient Hospital Stay: Payer: 59

## 2020-06-11 ENCOUNTER — Other Ambulatory Visit: Payer: Self-pay

## 2020-06-11 ENCOUNTER — Ambulatory Visit
Admission: RE | Admit: 2020-06-11 | Discharge: 2020-06-11 | Disposition: A | Discharge status: Home / Self Care | Payer: 59 | Source: Ambulatory Visit | Attending: Radiation Oncology | Admitting: Radiation Oncology

## 2020-06-11 VITALS — BP 155/76 | HR 75 | Temp 97.0°F | Resp 18

## 2020-06-11 DIAGNOSIS — C01 Malignant neoplasm of base of tongue: Secondary | ICD-10-CM

## 2020-06-11 DIAGNOSIS — Z5111 Encounter for antineoplastic chemotherapy: Secondary | ICD-10-CM | POA: Diagnosis not present

## 2020-06-11 MED ORDER — HEPARIN SOD (PORK) LOCK FLUSH 100 UNIT/ML IV SOLN
500.0000 [IU] | Freq: Once | INTRAVENOUS | Status: AC | PRN
  Administered 2020-06-11: 500 [IU]
  Filled 2020-06-11: qty 5

## 2020-06-11 MED ORDER — SODIUM CHLORIDE 0.9 % IV SOLN
Freq: Once | INTRAVENOUS | Status: AC
  Filled 2020-06-11: qty 250

## 2020-06-11 MED ORDER — SODIUM CHLORIDE 0.9% FLUSH
10.0000 mL | Freq: Once | INTRAVENOUS | Status: AC | PRN
  Administered 2020-06-11: 10 mL
  Filled 2020-06-11: qty 10

## 2020-06-11 NOTE — Progress Notes (Signed)
Received IV hydration this am. No complaints. Discharged. Ambulatory.

## 2020-06-14 ENCOUNTER — Ambulatory Visit: Payer: 59

## 2020-06-15 ENCOUNTER — Ambulatory Visit: Payer: 59

## 2020-06-15 DIAGNOSIS — Z5111 Encounter for antineoplastic chemotherapy: Secondary | ICD-10-CM | POA: Diagnosis not present

## 2020-06-16 ENCOUNTER — Ambulatory Visit: Payer: 59

## 2020-06-16 ENCOUNTER — Inpatient Hospital Stay: Payer: 59

## 2020-06-16 ENCOUNTER — Other Ambulatory Visit: Payer: Self-pay

## 2020-06-16 ENCOUNTER — Inpatient Hospital Stay (HOSPITAL_BASED_OUTPATIENT_CLINIC_OR_DEPARTMENT_OTHER): Payer: 59 | Admitting: Internal Medicine

## 2020-06-16 DIAGNOSIS — C01 Malignant neoplasm of base of tongue: Secondary | ICD-10-CM | POA: Diagnosis not present

## 2020-06-16 DIAGNOSIS — T451X5A Adverse effect of antineoplastic and immunosuppressive drugs, initial encounter: Secondary | ICD-10-CM

## 2020-06-16 DIAGNOSIS — R110 Nausea: Secondary | ICD-10-CM

## 2020-06-16 DIAGNOSIS — Z5111 Encounter for antineoplastic chemotherapy: Secondary | ICD-10-CM | POA: Diagnosis not present

## 2020-06-16 DIAGNOSIS — Z7189 Other specified counseling: Secondary | ICD-10-CM

## 2020-06-16 LAB — CBC WITH DIFFERENTIAL/PLATELET
Abs Immature Granulocytes: 0.01 10*3/uL (ref 0.00–0.07)
Basophils Absolute: 0 10*3/uL (ref 0.0–0.1)
Basophils Relative: 1 %
Eosinophils Absolute: 0 10*3/uL (ref 0.0–0.5)
Eosinophils Relative: 1 %
HCT: 38 % — ABNORMAL LOW (ref 39.0–52.0)
Hemoglobin: 13.8 g/dL (ref 13.0–17.0)
Immature Granulocytes: 0 %
Lymphocytes Relative: 23 %
Lymphs Abs: 0.6 10*3/uL — ABNORMAL LOW (ref 0.7–4.0)
MCH: 32.5 pg (ref 26.0–34.0)
MCHC: 36.3 g/dL — ABNORMAL HIGH (ref 30.0–36.0)
MCV: 89.4 fL (ref 80.0–100.0)
Monocytes Absolute: 0.4 10*3/uL (ref 0.1–1.0)
Monocytes Relative: 15 %
Neutro Abs: 1.6 10*3/uL — ABNORMAL LOW (ref 1.7–7.7)
Neutrophils Relative %: 60 %
Platelets: 161 10*3/uL (ref 150–400)
RBC: 4.25 MIL/uL (ref 4.22–5.81)
RDW: 13 % (ref 11.5–15.5)
WBC: 2.6 10*3/uL — ABNORMAL LOW (ref 4.0–10.5)
nRBC: 0 % (ref 0.0–0.2)

## 2020-06-16 LAB — COMPREHENSIVE METABOLIC PANEL
ALT: 27 U/L (ref 0–44)
AST: 28 U/L (ref 15–41)
Albumin: 3.6 g/dL (ref 3.5–5.0)
Alkaline Phosphatase: 107 U/L (ref 38–126)
Anion gap: 13 (ref 5–15)
BUN: 22 mg/dL — ABNORMAL HIGH (ref 6–20)
CO2: 25 mmol/L (ref 22–32)
Calcium: 9 mg/dL (ref 8.9–10.3)
Chloride: 97 mmol/L — ABNORMAL LOW (ref 98–111)
Creatinine, Ser: 1.38 mg/dL — ABNORMAL HIGH (ref 0.61–1.24)
GFR, Estimated: 59 mL/min — ABNORMAL LOW (ref >60)
Glucose, Bld: 146 mg/dL — ABNORMAL HIGH (ref 70–99)
Potassium: 3.6 mmol/L (ref 3.5–5.1)
Sodium: 135 mmol/L (ref 135–145)
Total Bilirubin: 1.1 mg/dL (ref 0.3–1.2)
Total Protein: 7.2 g/dL (ref 6.5–8.1)

## 2020-06-16 LAB — MAGNESIUM: Magnesium: 1.6 mg/dL — ABNORMAL LOW (ref 1.7–2.4)

## 2020-06-16 MED ORDER — HEPARIN SOD (PORK) LOCK FLUSH 100 UNIT/ML IV SOLN
INTRAVENOUS | Status: AC
  Filled 2020-06-16: qty 5

## 2020-06-16 MED ORDER — HEPARIN SOD (PORK) LOCK FLUSH 100 UNIT/ML IV SOLN
500.0000 [IU] | Freq: Once | INTRAVENOUS | Status: DC | PRN
  Filled 2020-06-16: qty 5

## 2020-06-16 MED ORDER — SODIUM CHLORIDE 0.9 % IV SOLN
150.0000 mg | Freq: Once | INTRAVENOUS | Status: AC
  Administered 2020-06-16: 150 mg via INTRAVENOUS
  Filled 2020-06-16: qty 150

## 2020-06-16 MED ORDER — SODIUM CHLORIDE 0.9 % IV SOLN
40.0000 mg/m2 | Freq: Once | INTRAVENOUS | Status: AC
  Administered 2020-06-16: 104 mg via INTRAVENOUS
  Filled 2020-06-16: qty 104

## 2020-06-16 MED ORDER — POTASSIUM CHLORIDE IN NACL 20-0.9 MEQ/L-% IV SOLN
Freq: Once | INTRAVENOUS | Status: AC
  Filled 2020-06-16: qty 1000

## 2020-06-16 MED ORDER — HEPARIN SOD (PORK) LOCK FLUSH 100 UNIT/ML IV SOLN
500.0000 [IU] | Freq: Once | INTRAVENOUS | Status: AC
  Administered 2020-06-16: 500 [IU] via INTRAVENOUS
  Filled 2020-06-16: qty 5

## 2020-06-16 MED ORDER — SODIUM CHLORIDE 0.9 % IV SOLN
Freq: Once | INTRAVENOUS | Status: AC
  Filled 2020-06-16: qty 250

## 2020-06-16 MED ORDER — SODIUM CHLORIDE 0.9 % IV SOLN
10.0000 mg | Freq: Once | INTRAVENOUS | Status: AC
  Administered 2020-06-16: 10 mg via INTRAVENOUS
  Filled 2020-06-16: qty 10

## 2020-06-16 MED ORDER — SODIUM CHLORIDE 0.9% FLUSH
10.0000 mL | INTRAVENOUS | Status: DC | PRN
  Administered 2020-06-16: 10 mL via INTRAVENOUS
  Filled 2020-06-16: qty 10

## 2020-06-16 MED ORDER — PALONOSETRON HCL INJECTION 0.25 MG/5ML
0.2500 mg | Freq: Once | INTRAVENOUS | Status: AC
  Administered 2020-06-16: 0.25 mg via INTRAVENOUS

## 2020-06-16 MED ORDER — MAGNESIUM SULFATE 2 GM/50ML IV SOLN
2.0000 g | Freq: Once | INTRAVENOUS | Status: AC
  Administered 2020-06-16: 2 g via INTRAVENOUS
  Filled 2020-06-16: qty 50

## 2020-06-16 MED ORDER — ONDANSETRON HCL 8 MG PO TABS: 8.0000 mg | ORAL_TABLET | Freq: Three times a day (TID) | ORAL | 4 refills | Status: DC | PRN

## 2020-06-16 MED ORDER — SODIUM CHLORIDE 0.9 % IV SOLN: 40.0000 mg/m2 | Freq: Once | INTRAVENOUS | Status: DC

## 2020-06-16 MED FILL — ONDANSETRON HCL 8 MG TABLET: 21 days supply | Qty: 18 | Fill #0

## 2020-06-16 NOTE — Progress Notes (Signed)
Pt evaluated by MD. Madaline Brilliant to tx with HR 105 per MD.pt c/o some nausea . Gave aloxi and decadron during hydration- pt states symptoms resolved. Pt completed tx without incident. Pt discharged stable.

## 2020-06-16 NOTE — Assessment & Plan Note (Addendum)
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   # proceed with weekly cycle #6. cisplatin; Labs today reviewed;  acceptable for treatment today- creatinine-1.38 [GFR-59]; Continue with radiation [until2/23]. Proceed with continued IV hydration- see below   # Nausea/gagging-thick secretions- sec to cisplatin/RT- continue zofran/compazine prn.   # Constipation: STBLAE" continue with mirlaax/ glycerine suppository.   # acute renal failure- sec to cisplatin- creatinine-1.38; plan more frequent IVFs post chemo. NO nephrotoxic agents.   # Mucositis- baking soda/salt water rinses/ therasol [MM-not covered]; discussed re: Morphine liquid- STABLE.   # DM- on metformin- BG-146-STABLE.    # Mediport/IV Access:   # DISPOSITION: # chemo today. # IVFs-over 1 hoour- 2/10; 2/11; 2/14  # follow up in 1 week- MD; labs- cbc/cmp/mag; cisplatin # follow up in 2 week- MD; labs- cbc/cmp/mag; cisplatin-Dr.B

## 2020-06-16 NOTE — Progress Notes (Signed)
Hr 105 ok per MD
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
## 2020-06-17 ENCOUNTER — Inpatient Hospital Stay: Payer: 59

## 2020-06-17 ENCOUNTER — Ambulatory Visit
Admission: RE | Admit: 2020-06-17 | Discharge: 2020-06-17 | Disposition: A | Discharge status: Home / Self Care | Payer: 59 | Source: Ambulatory Visit | Attending: Radiation Oncology | Admitting: Radiation Oncology

## 2020-06-17 VITALS — BP 155/88 | HR 72 | Temp 97.8°F | Resp 18

## 2020-06-17 DIAGNOSIS — Z5111 Encounter for antineoplastic chemotherapy: Secondary | ICD-10-CM | POA: Diagnosis not present

## 2020-06-17 DIAGNOSIS — C01 Malignant neoplasm of base of tongue: Secondary | ICD-10-CM

## 2020-06-17 DIAGNOSIS — E860 Dehydration: Secondary | ICD-10-CM

## 2020-06-17 MED ORDER — SODIUM CHLORIDE 0.9 % IV SOLN
Freq: Once | INTRAVENOUS | Status: AC
  Filled 2020-06-17: qty 250

## 2020-06-17 MED ORDER — SODIUM CHLORIDE 0.9% FLUSH
10.0000 mL | Freq: Once | INTRAVENOUS | Status: AC | PRN
  Administered 2020-06-17: 10 mL
  Filled 2020-06-17: qty 10

## 2020-06-17 MED ORDER — HEPARIN SOD (PORK) LOCK FLUSH 100 UNIT/ML IV SOLN
500.0000 [IU] | Freq: Once | INTRAVENOUS | Status: AC | PRN
  Administered 2020-06-17: 500 [IU]
  Filled 2020-06-17: qty 5

## 2020-06-17 NOTE — Progress Notes (Signed)
Nutrition Follow-up:  Patient with base of the tongue cancer with bilateral lymphadenopathy.  Patient receiving concurrent chemotherapy and radiation.   Met with patient during IV fluids this am.  Patient reports that thick mucous is gagging him and making him feel nauseated.  Reports that he is making homemade shakes with ensure that equal about 550 calories and drinking about 2 per day.  Drinking liquids and continues rinse to help with mucous.  Continues to puree foods  Medications: reviewed  Labs: reviewed  Anthropometrics:   Weight 296 lb on 2/9  308 lb on 2/2 311 lb 1/16 323 lb on 1/19 343 lb on 12/28 340 lb on 12/3    NUTRITION DIAGNOSIS: Predicted sub optimal energy intake continues    INTERVENTION:  Continue liquids and rinse for excess mucous.  Handout on thick saliva given at past visit. Encouraged patient to increase homemade shakes for more calories and protein (ideally needs ~5-6 per day) to better meet protein and calories needs Patient has contact information Declines needing more shakes today Reviewed significance of weight loss with patient again today and how this impacts recovery    MONITORING, EVALUATION, GOAL: weight trends, intake   NEXT VISIT: Feb 28 phone f/u  Joshua Bray, [place], [place] Registered Dietitian [phone] (mobile)

## 2020-06-17 NOTE — Progress Notes (Signed)
Pt states his phlegm has turned to a yellow color. He is wondering if he has a sinus infection. Also noted a burning sensation in back of throat/neck. Spoke to MD. Ordered 2 liters of NS today. Pt discharged from clinic. Going to radiation therapy next.

## 2020-06-18 ENCOUNTER — Inpatient Hospital Stay: Payer: 59

## 2020-06-18 ENCOUNTER — Other Ambulatory Visit: Payer: Self-pay

## 2020-06-18 ENCOUNTER — Ambulatory Visit
Admission: RE | Admit: 2020-06-18 | Discharge: 2020-06-18 | Disposition: A | Discharge status: Home / Self Care | Payer: 59 | Source: Ambulatory Visit | Attending: Radiation Oncology | Admitting: Radiation Oncology

## 2020-06-18 VITALS — BP 160/90 | HR 77 | Temp 97.8°F | Resp 18

## 2020-06-18 DIAGNOSIS — Z5111 Encounter for antineoplastic chemotherapy: Secondary | ICD-10-CM | POA: Diagnosis not present

## 2020-06-18 DIAGNOSIS — C01 Malignant neoplasm of base of tongue: Secondary | ICD-10-CM

## 2020-06-18 MED ORDER — SODIUM CHLORIDE 0.9% FLUSH
10.0000 mL | Freq: Once | INTRAVENOUS | Status: AC | PRN
  Administered 2020-06-18: 10 mL
  Filled 2020-06-18: qty 10

## 2020-06-18 MED ORDER — HEPARIN SOD (PORK) LOCK FLUSH 100 UNIT/ML IV SOLN
500.0000 [IU] | Freq: Once | INTRAVENOUS | Status: AC | PRN
  Administered 2020-06-18: 500 [IU]
  Filled 2020-06-18: qty 5

## 2020-06-18 MED ORDER — SODIUM CHLORIDE 0.9 % IV SOLN
Freq: Once | INTRAVENOUS | Status: AC
  Filled 2020-06-18: qty 250

## 2020-06-18 NOTE — Progress Notes (Signed)
Pt receiving 1 liter of NS today. His skin around neck is getting redder from XRT. He is applying aquaphor ointment regularly. No complaints at discharge.

## 2020-06-21 ENCOUNTER — Inpatient Hospital Stay: Payer: 59

## 2020-06-21 ENCOUNTER — Ambulatory Visit
Admission: RE | Admit: 2020-06-21 | Discharge: 2020-06-21 | Disposition: A | Discharge status: Home / Self Care | Payer: 59 | Source: Ambulatory Visit | Attending: Radiation Oncology | Admitting: Radiation Oncology

## 2020-06-21 VITALS — BP 122/78 | HR 87 | Temp 98.4°F | Resp 16

## 2020-06-21 DIAGNOSIS — Z5111 Encounter for antineoplastic chemotherapy: Secondary | ICD-10-CM | POA: Diagnosis not present

## 2020-06-21 DIAGNOSIS — C01 Malignant neoplasm of base of tongue: Secondary | ICD-10-CM

## 2020-06-21 MED ORDER — HEPARIN SOD (PORK) LOCK FLUSH 100 UNIT/ML IV SOLN
500.0000 [IU] | Freq: Once | INTRAVENOUS | Status: AC | PRN
  Administered 2020-06-21: 500 [IU]
  Filled 2020-06-21: qty 5

## 2020-06-21 MED ORDER — SODIUM CHLORIDE 0.9 % IV SOLN
Freq: Once | INTRAVENOUS | Status: AC
  Filled 2020-06-21: qty 250

## 2020-06-21 MED ORDER — HEPARIN SOD (PORK) LOCK FLUSH 100 UNIT/ML IV SOLN
INTRAVENOUS | Status: AC
  Filled 2020-06-21: qty 5

## 2020-06-21 NOTE — Progress Notes (Signed)
Pt administered 1 Liter of NS today per MD orders.  Pt tolerated infusion well and left the infusion suite stable and ambulatory.

## 2020-06-22 ENCOUNTER — Ambulatory Visit: Payer: 59

## 2020-06-22 ENCOUNTER — Ambulatory Visit
Admission: RE | Admit: 2020-06-22 | Discharge: 2020-06-22 | Disposition: A | Discharge status: Home / Self Care | Payer: 59 | Source: Ambulatory Visit | Attending: Radiation Oncology | Admitting: Radiation Oncology

## 2020-06-22 DIAGNOSIS — Z5111 Encounter for antineoplastic chemotherapy: Secondary | ICD-10-CM | POA: Diagnosis not present

## 2020-06-23 ENCOUNTER — Inpatient Hospital Stay: Payer: 59

## 2020-06-23 ENCOUNTER — Other Ambulatory Visit: Payer: Self-pay | Admitting: Internal Medicine

## 2020-06-23 ENCOUNTER — Ambulatory Visit
Admission: RE | Admit: 2020-06-23 | Discharge: 2020-06-23 | Disposition: A | Discharge status: Home / Self Care | Payer: 59 | Source: Ambulatory Visit | Attending: Radiation Oncology | Admitting: Radiation Oncology

## 2020-06-23 ENCOUNTER — Inpatient Hospital Stay (HOSPITAL_BASED_OUTPATIENT_CLINIC_OR_DEPARTMENT_OTHER): Payer: 59 | Admitting: Internal Medicine

## 2020-06-23 ENCOUNTER — Ambulatory Visit: Payer: 59

## 2020-06-23 ENCOUNTER — Encounter: Payer: Self-pay | Admitting: Internal Medicine

## 2020-06-23 DIAGNOSIS — C01 Malignant neoplasm of base of tongue: Secondary | ICD-10-CM

## 2020-06-23 DIAGNOSIS — Z5111 Encounter for antineoplastic chemotherapy: Secondary | ICD-10-CM | POA: Diagnosis not present

## 2020-06-23 DIAGNOSIS — Z7189 Other specified counseling: Secondary | ICD-10-CM

## 2020-06-23 LAB — CBC WITH DIFFERENTIAL/PLATELET
Abs Immature Granulocytes: 0.01 10*3/uL (ref 0.00–0.07)
Basophils Absolute: 0 10*3/uL (ref 0.0–0.1)
Basophils Relative: 1 %
Eosinophils Absolute: 0 10*3/uL (ref 0.0–0.5)
Eosinophils Relative: 1 %
HCT: 34.7 % — ABNORMAL LOW (ref 39.0–52.0)
Hemoglobin: 12.8 g/dL — ABNORMAL LOW (ref 13.0–17.0)
Immature Granulocytes: 1 %
Lymphocytes Relative: 26 %
Lymphs Abs: 0.5 10*3/uL — ABNORMAL LOW (ref 0.7–4.0)
MCH: 33 pg (ref 26.0–34.0)
MCHC: 36.9 g/dL — ABNORMAL HIGH (ref 30.0–36.0)
MCV: 89.4 fL (ref 80.0–100.0)
Monocytes Absolute: 0.4 10*3/uL (ref 0.1–1.0)
Monocytes Relative: 20 %
Neutro Abs: 1.1 10*3/uL — ABNORMAL LOW (ref 1.7–7.7)
Neutrophils Relative %: 51 %
Platelets: 120 10*3/uL — ABNORMAL LOW (ref 150–400)
RBC: 3.88 MIL/uL — ABNORMAL LOW (ref 4.22–5.81)
RDW: 13.6 % (ref 11.5–15.5)
WBC: 2 10*3/uL — ABNORMAL LOW (ref 4.0–10.5)
nRBC: 0 % (ref 0.0–0.2)

## 2020-06-23 LAB — COMPREHENSIVE METABOLIC PANEL
ALT: 27 U/L (ref 0–44)
AST: 29 U/L (ref 15–41)
Albumin: 3.4 g/dL — ABNORMAL LOW (ref 3.5–5.0)
Alkaline Phosphatase: 97 U/L (ref 38–126)
Anion gap: 14 (ref 5–15)
BUN: 20 mg/dL (ref 6–20)
CO2: 23 mmol/L (ref 22–32)
Calcium: 8.7 mg/dL — ABNORMAL LOW (ref 8.9–10.3)
Chloride: 99 mmol/L (ref 98–111)
Creatinine, Ser: 1.34 mg/dL — ABNORMAL HIGH (ref 0.61–1.24)
GFR, Estimated: >60 mL/min (ref >60)
Glucose, Bld: 115 mg/dL — ABNORMAL HIGH (ref 70–99)
Potassium: 3.7 mmol/L (ref 3.5–5.1)
Sodium: 136 mmol/L (ref 135–145)
Total Bilirubin: 1.1 mg/dL (ref 0.3–1.2)
Total Protein: 6.6 g/dL (ref 6.5–8.1)

## 2020-06-23 LAB — MAGNESIUM: Magnesium: 1.4 mg/dL — ABNORMAL LOW (ref 1.7–2.4)

## 2020-06-23 MED ORDER — HEPARIN SOD (PORK) LOCK FLUSH 100 UNIT/ML IV SOLN
500.0000 [IU] | Freq: Once | INTRAVENOUS | Status: AC | PRN
  Administered 2020-06-23: 500 [IU]
  Filled 2020-06-23: qty 5

## 2020-06-23 MED ORDER — SODIUM CHLORIDE 0.9 % IV SOLN
104.0000 mg | Freq: Once | INTRAVENOUS | Status: AC
  Administered 2020-06-23: 104 mg via INTRAVENOUS
  Filled 2020-06-23: qty 104

## 2020-06-23 MED ORDER — MAGNESIUM SULFATE 2 GM/50ML IV SOLN
2.0000 g | Freq: Once | INTRAVENOUS | Status: AC
  Administered 2020-06-23: 2 g via INTRAVENOUS
  Filled 2020-06-23: qty 50

## 2020-06-23 MED ORDER — PALONOSETRON HCL INJECTION 0.25 MG/5ML
0.2500 mg | Freq: Once | INTRAVENOUS | Status: AC
  Administered 2020-06-23: 0.25 mg via INTRAVENOUS

## 2020-06-23 MED ORDER — SODIUM CHLORIDE 0.9% FLUSH
10.0000 mL | INTRAVENOUS | Status: DC | PRN
  Administered 2020-06-23: 10 mL
  Filled 2020-06-23: qty 10

## 2020-06-23 MED ORDER — HEPARIN SOD (PORK) LOCK FLUSH 100 UNIT/ML IV SOLN
INTRAVENOUS | Status: AC
  Filled 2020-06-23: qty 5

## 2020-06-23 MED ORDER — POTASSIUM CHLORIDE IN NACL 20-0.9 MEQ/L-% IV SOLN
Freq: Once | INTRAVENOUS | Status: AC
  Filled 2020-06-23: qty 1000

## 2020-06-23 MED ORDER — SODIUM CHLORIDE 0.9 % IV SOLN
150.0000 mg | Freq: Once | INTRAVENOUS | Status: AC
  Administered 2020-06-23: 150 mg via INTRAVENOUS
  Filled 2020-06-23: qty 150

## 2020-06-23 MED ORDER — SODIUM CHLORIDE 0.9 % IV SOLN
Freq: Once | INTRAVENOUS | Status: AC
  Filled 2020-06-23: qty 250

## 2020-06-23 MED ORDER — SODIUM CHLORIDE 0.9 % IV SOLN
10.0000 mg | Freq: Once | INTRAVENOUS | Status: AC
  Administered 2020-06-23: 10 mg via INTRAVENOUS
  Filled 2020-06-23: qty 10

## 2020-06-23 NOTE — Assessment & Plan Note (Signed)
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   # Proceed with weekly cycle #7 cisplatin; Labs today reviewed;  acceptable for treatment today-ANC-1.1; platelets- 120; creatinine-1.32 [GFR->60]; Continue with radiation [until2/23]. Proceed with continued IV hydration- see below   # Nausea/gagging-thick secretions- sec to cisplatin/RT- continue zofran/compazine prn.   # Constipation: STABLE;   continue with mirlaax/ glycerine suppository.   # acute renal failure- sec to cisplatin- creatinine-1.34; continue good morning frequent IVFs post chemo. NO nephrotoxic agents.   # Mucositis- baking soda/salt water rinses/ therasol [MM-not covered]; discussed re: Morphine liquid ONLY as needed;  STABLE.   # DM- on metformin- BG-115-STABLE.  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    # Mediport/IV Access: stable.   # DISPOSITION: # chemo today. # IVFs-over 1 hoour- 2/17; 2/18; 2/21; 2/22 # follow up in 1 week- MD; labs- cbc/cmp/mag; cisplatin-Dr.B

## 2020-06-23 NOTE — Progress Notes (Signed)
Antiemetics administered prior to Mg/K/IVFs per patient request due to acute nausea. Tolerated cisplatin well. UOP 142ml prior to cisplatin - OK to proceed with cisplatin per Dr. Rogue Bussing. Discharged home in stable condition.

## 2020-06-23 NOTE — Progress Notes (Signed)
Having increased mucous and nasal discharge. Redness and irritation around neck. No appetite due to mucous increase. States everything tastes nasty
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
## 2020-06-24 ENCOUNTER — Ambulatory Visit: Payer: 59

## 2020-06-24 ENCOUNTER — Ambulatory Visit
Admission: RE | Admit: 2020-06-24 | Discharge: 2020-06-24 | Disposition: A | Discharge status: Home / Self Care | Payer: 59 | Source: Ambulatory Visit | Attending: Radiation Oncology | Admitting: Radiation Oncology

## 2020-06-24 ENCOUNTER — Inpatient Hospital Stay: Payer: 59

## 2020-06-24 VITALS — BP 142/80 | HR 70 | Temp 97.8°F | Resp 18

## 2020-06-24 DIAGNOSIS — Z5111 Encounter for antineoplastic chemotherapy: Secondary | ICD-10-CM | POA: Diagnosis not present

## 2020-06-24 DIAGNOSIS — C01 Malignant neoplasm of base of tongue: Secondary | ICD-10-CM

## 2020-06-24 MED ORDER — SODIUM CHLORIDE 0.9 % IV SOLN
Freq: Once | INTRAVENOUS | Status: AC
  Filled 2020-06-24: qty 250

## 2020-06-24 MED ORDER — SODIUM CHLORIDE 0.9% FLUSH
10.0000 mL | Freq: Once | INTRAVENOUS | Status: AC | PRN
  Administered 2020-06-24: 10 mL
  Filled 2020-06-24: qty 10

## 2020-06-24 MED ORDER — HEPARIN SOD (PORK) LOCK FLUSH 100 UNIT/ML IV SOLN
500.0000 [IU] | Freq: Once | INTRAVENOUS | Status: AC | PRN
  Administered 2020-06-24: 500 [IU]
  Filled 2020-06-24: qty 5

## 2020-06-24 NOTE — Progress Notes (Signed)
Patient received IV hydration today. Np complaints at time of discharge.

## 2020-06-25 ENCOUNTER — Ambulatory Visit
Admission: RE | Admit: 2020-06-25 | Discharge: 2020-06-25 | Disposition: A | Discharge status: Home / Self Care | Payer: 59 | Source: Ambulatory Visit | Attending: Radiation Oncology | Admitting: Radiation Oncology

## 2020-06-25 ENCOUNTER — Ambulatory Visit: Payer: 59

## 2020-06-25 ENCOUNTER — Other Ambulatory Visit: Payer: Self-pay

## 2020-06-25 ENCOUNTER — Inpatient Hospital Stay: Payer: 59

## 2020-06-25 VITALS — BP 155/87 | HR 101 | Temp 96.3°F | Resp 18 | Wt 291.4 lb

## 2020-06-25 DIAGNOSIS — Z5111 Encounter for antineoplastic chemotherapy: Secondary | ICD-10-CM | POA: Diagnosis not present

## 2020-06-25 DIAGNOSIS — C01 Malignant neoplasm of base of tongue: Secondary | ICD-10-CM

## 2020-06-25 MED ORDER — HEPARIN SOD (PORK) LOCK FLUSH 100 UNIT/ML IV SOLN
500.0000 [IU] | Freq: Once | INTRAVENOUS | Status: AC | PRN
  Administered 2020-06-25: 500 [IU]
  Filled 2020-06-25: qty 5

## 2020-06-25 MED ORDER — SODIUM CHLORIDE 0.9 % IV SOLN
Freq: Once | INTRAVENOUS | Status: AC
  Filled 2020-06-25: qty 250

## 2020-06-25 MED ORDER — SODIUM CHLORIDE 0.9% FLUSH
10.0000 mL | Freq: Once | INTRAVENOUS | Status: AC | PRN
  Administered 2020-06-25: 10 mL
  Filled 2020-06-25: qty 10

## 2020-06-25 NOTE — Progress Notes (Signed)
Pt received IV hydration today. No complaints at discharge. Pt tries to sip on liquids for hydration at home.

## 2020-06-28 ENCOUNTER — Other Ambulatory Visit: Payer: Self-pay

## 2020-06-28 ENCOUNTER — Ambulatory Visit
Admission: RE | Admit: 2020-06-28 | Discharge: 2020-06-28 | Disposition: A | Discharge status: Home / Self Care | Payer: 59 | Source: Ambulatory Visit | Attending: Radiation Oncology | Admitting: Radiation Oncology

## 2020-06-28 ENCOUNTER — Inpatient Hospital Stay: Payer: 59

## 2020-06-28 VITALS — BP 120/80 | HR 115 | Temp 96.2°F | Resp 18

## 2020-06-28 DIAGNOSIS — Z5111 Encounter for antineoplastic chemotherapy: Secondary | ICD-10-CM | POA: Diagnosis not present

## 2020-06-28 DIAGNOSIS — C01 Malignant neoplasm of base of tongue: Secondary | ICD-10-CM

## 2020-06-28 MED ORDER — SODIUM CHLORIDE 0.9 % IV SOLN
Freq: Once | INTRAVENOUS | Status: AC
Start: 2020-06-28 — End: 2020-06-28
  Filled 2020-06-28: qty 250

## 2020-06-28 MED ORDER — HEPARIN SOD (PORK) LOCK FLUSH 100 UNIT/ML IV SOLN
500.0000 [IU] | Freq: Once | INTRAVENOUS | Status: AC | PRN
  Administered 2020-06-28: 500 [IU]
  Filled 2020-06-28: qty 5

## 2020-06-28 MED ORDER — SODIUM CHLORIDE 0.9% FLUSH
10.0000 mL | Freq: Once | INTRAVENOUS | Status: AC | PRN
  Administered 2020-06-28: 10 mL
  Filled 2020-06-28: qty 10

## 2020-06-28 NOTE — Progress Notes (Signed)
Received IV hydration today. No complaints offered at time of discharge.

## 2020-06-29 ENCOUNTER — Other Ambulatory Visit: Payer: Self-pay | Admitting: *Deleted

## 2020-06-29 ENCOUNTER — Inpatient Hospital Stay: Payer: 59

## 2020-06-29 ENCOUNTER — Ambulatory Visit
Admission: RE | Admit: 2020-06-29 | Discharge: 2020-06-29 | Disposition: A | Discharge status: Home / Self Care | Payer: 59 | Source: Ambulatory Visit | Attending: Radiation Oncology | Admitting: Radiation Oncology

## 2020-06-29 VITALS — BP 138/79 | HR 80 | Temp 96.9°F | Resp 18

## 2020-06-29 DIAGNOSIS — C01 Malignant neoplasm of base of tongue: Secondary | ICD-10-CM

## 2020-06-29 DIAGNOSIS — Z5111 Encounter for antineoplastic chemotherapy: Secondary | ICD-10-CM | POA: Diagnosis not present

## 2020-06-29 DIAGNOSIS — E860 Dehydration: Secondary | ICD-10-CM

## 2020-06-29 MED ORDER — SILVER SULFADIAZINE 1 % EX CREA: 1.0000 "application " | TOPICAL_CREAM | Freq: Two times a day (BID) | CUTANEOUS | 2 refills | Status: DC

## 2020-06-29 MED ORDER — SODIUM CHLORIDE 0.9% FLUSH
10.0000 mL | Freq: Once | INTRAVENOUS | Status: AC | PRN
  Administered 2020-06-29: 10 mL
  Filled 2020-06-29: qty 10

## 2020-06-29 MED ORDER — SODIUM CHLORIDE 0.9 % IV SOLN
Freq: Once | INTRAVENOUS | Status: AC
  Filled 2020-06-29: qty 250

## 2020-06-29 MED ORDER — HEPARIN SOD (PORK) LOCK FLUSH 100 UNIT/ML IV SOLN
500.0000 [IU] | Freq: Once | INTRAVENOUS | Status: AC | PRN
  Administered 2020-06-29: 500 [IU]
  Filled 2020-06-29: qty 5

## 2020-06-29 MED FILL — SSD 1% CREAM: 30 days supply | Qty: 85 | Fill #0

## 2020-06-29 NOTE — Progress Notes (Signed)
Pt having difficulty with getting protein shakes in. He is mostly taking in water. He was given 2 liters of IVF's today. Tomorrow is his last scheduled chemotherapy treatment and his last radiation treatment. No complaints at time of discharge.

## 2020-06-30 ENCOUNTER — Ambulatory Visit
Admission: RE | Admit: 2020-06-30 | Discharge: 2020-06-30 | Disposition: A | Discharge status: Home / Self Care | Payer: 59 | Source: Ambulatory Visit | Attending: Radiation Oncology | Admitting: Radiation Oncology

## 2020-06-30 ENCOUNTER — Inpatient Hospital Stay (HOSPITAL_BASED_OUTPATIENT_CLINIC_OR_DEPARTMENT_OTHER): Payer: 59 | Admitting: Internal Medicine

## 2020-06-30 ENCOUNTER — Inpatient Hospital Stay: Payer: 59

## 2020-06-30 ENCOUNTER — Other Ambulatory Visit: Payer: Self-pay

## 2020-06-30 ENCOUNTER — Ambulatory Visit: Payer: 59

## 2020-06-30 VITALS — BP 148/93 | HR 82 | Resp 16

## 2020-06-30 DIAGNOSIS — Z5111 Encounter for antineoplastic chemotherapy: Secondary | ICD-10-CM | POA: Diagnosis not present

## 2020-06-30 DIAGNOSIS — C01 Malignant neoplasm of base of tongue: Secondary | ICD-10-CM

## 2020-06-30 DIAGNOSIS — Z7189 Other specified counseling: Secondary | ICD-10-CM

## 2020-06-30 LAB — CBC WITH DIFFERENTIAL/PLATELET
Abs Immature Granulocytes: 0.01 10*3/uL (ref 0.00–0.07)
Basophils Absolute: 0 10*3/uL (ref 0.0–0.1)
Basophils Relative: 1 %
Eosinophils Absolute: 0 10*3/uL (ref 0.0–0.5)
Eosinophils Relative: 1 %
HCT: 34.5 % — ABNORMAL LOW (ref 39.0–52.0)
Hemoglobin: 12.4 g/dL — ABNORMAL LOW (ref 13.0–17.0)
Immature Granulocytes: 0 %
Lymphocytes Relative: 21 %
Lymphs Abs: 0.5 10*3/uL — ABNORMAL LOW (ref 0.7–4.0)
MCH: 32.5 pg (ref 26.0–34.0)
MCHC: 35.9 g/dL (ref 30.0–36.0)
MCV: 90.3 fL (ref 80.0–100.0)
Monocytes Absolute: 0.4 10*3/uL (ref 0.1–1.0)
Monocytes Relative: 19 %
Neutro Abs: 1.4 10*3/uL — ABNORMAL LOW (ref 1.7–7.7)
Neutrophils Relative %: 58 %
Platelets: 121 10*3/uL — ABNORMAL LOW (ref 150–400)
RBC: 3.82 MIL/uL — ABNORMAL LOW (ref 4.22–5.81)
RDW: 14.1 % (ref 11.5–15.5)
WBC: 2.4 10*3/uL — ABNORMAL LOW (ref 4.0–10.5)
nRBC: 0 % (ref 0.0–0.2)

## 2020-06-30 LAB — MAGNESIUM: Magnesium: 1 mg/dL — ABNORMAL LOW (ref 1.7–2.4)

## 2020-06-30 LAB — COMPREHENSIVE METABOLIC PANEL
ALT: 23 U/L (ref 0–44)
AST: 25 U/L (ref 15–41)
Albumin: 3.5 g/dL (ref 3.5–5.0)
Alkaline Phosphatase: 91 U/L (ref 38–126)
Anion gap: 15 (ref 5–15)
BUN: 17 mg/dL (ref 6–20)
CO2: 23 mmol/L (ref 22–32)
Calcium: 8.5 mg/dL — ABNORMAL LOW (ref 8.9–10.3)
Chloride: 100 mmol/L (ref 98–111)
Creatinine, Ser: 1.2 mg/dL (ref 0.61–1.24)
GFR, Estimated: >60 mL/min (ref >60)
Glucose, Bld: 104 mg/dL — ABNORMAL HIGH (ref 70–99)
Potassium: 3.3 mmol/L — ABNORMAL LOW (ref 3.5–5.1)
Sodium: 138 mmol/L (ref 135–145)
Total Bilirubin: 1.1 mg/dL (ref 0.3–1.2)
Total Protein: 6.6 g/dL (ref 6.5–8.1)

## 2020-06-30 MED ORDER — PALONOSETRON HCL INJECTION 0.25 MG/5ML
0.2500 mg | Freq: Once | INTRAVENOUS | Status: AC
  Administered 2020-06-30: 0.25 mg via INTRAVENOUS
  Filled 2020-06-30: qty 5

## 2020-06-30 MED ORDER — POTASSIUM CHLORIDE IN NACL 20-0.9 MEQ/L-% IV SOLN
Freq: Once | INTRAVENOUS | Status: AC
  Filled 2020-06-30: qty 1000

## 2020-06-30 MED ORDER — MAGNESIUM SULFATE 2 GM/50ML IV SOLN
2.0000 g | Freq: Once | INTRAVENOUS | Status: AC
  Administered 2020-06-30: 2 g via INTRAVENOUS
  Filled 2020-06-30: qty 50

## 2020-06-30 MED ORDER — SODIUM CHLORIDE 0.9 % IV SOLN
104.0000 mg | Freq: Once | INTRAVENOUS | Status: AC
  Administered 2020-06-30: 104 mg via INTRAVENOUS
  Filled 2020-06-30: qty 100

## 2020-06-30 MED ORDER — SODIUM CHLORIDE 0.9 % IV SOLN
10.0000 mg | Freq: Once | INTRAVENOUS | Status: AC
  Administered 2020-06-30: 10 mg via INTRAVENOUS
  Filled 2020-06-30: qty 10

## 2020-06-30 MED ORDER — SODIUM CHLORIDE 0.9 % IV SOLN
150.0000 mg | Freq: Once | INTRAVENOUS | Status: AC
  Administered 2020-06-30: 150 mg via INTRAVENOUS
  Filled 2020-06-30: qty 150

## 2020-06-30 MED ORDER — SODIUM CHLORIDE 0.9 % IV SOLN
Freq: Once | INTRAVENOUS | Status: AC
  Filled 2020-06-30: qty 250

## 2020-06-30 MED ORDER — HEPARIN SOD (PORK) LOCK FLUSH 100 UNIT/ML IV SOLN
INTRAVENOUS | Status: AC
  Filled 2020-06-30: qty 5

## 2020-06-30 MED ORDER — HEPARIN SOD (PORK) LOCK FLUSH 100 UNIT/ML IV SOLN
500.0000 [IU] | Freq: Once | INTRAVENOUS | Status: AC | PRN
  Administered 2020-06-30: 500 [IU]
  Filled 2020-06-30: qty 5

## 2020-06-30 NOTE — Progress Notes (Signed)
Per MD ok to treat with ANC 1.4

## 2020-06-30 NOTE — Assessment & Plan Note (Addendum)
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   # Proceed with weekly cycle #8 cisplatin; Labs today reviewed;  acceptable for treatment today-ANC-1.4; platelets- 121; creatinine-1.32 [GFR->60]; Continue with radiation [until2/23]. Proceed with continued IV hydration- see below   # Nausea/gagging-thick secretions- sec to cisplatin/RT- continue zofran/compazine prn.   # Constipation: STABLE;  continue with mirlaax/ glycerine suppository.   # Electrolyte abnormalities: Sec to cisplatin- creatinine-1.2-STABLE; Hypomag/hypokalemia/; continue good morning frequent IVFs.   # Mucositis- baking soda/salt water rinses/ therasol [MM-not covered]; discussed re: Morphine liquid ONLY as needed-STABLE.   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    # Mediport/IV Access: STABLE.   # DISPOSITION: # chemo today. # IVFs-Mag/Kcl-over 2 hours-2/24; 2/25; 2/28; 03/01 # follow up in 1 week- MD; labs- cbc/cmp/mag;NO CHEMO. IVFs/ Mag 2 gm- Dr.B

## 2020-06-30 NOTE — Progress Notes (Signed)
Has noticed some vision changes within the last 3 weeks.
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
## 2020-06-30 NOTE — Progress Notes (Signed)
ANC 1.4, HR 105, and MG 1.0. Per Dr. Rogue Bussing, okay to proceed with treatment.

## 2020-07-01 ENCOUNTER — Inpatient Hospital Stay: Payer: 59

## 2020-07-01 ENCOUNTER — Ambulatory Visit: Payer: 59

## 2020-07-01 VITALS — BP 133/79 | HR 92 | Temp 96.7°F

## 2020-07-01 DIAGNOSIS — C01 Malignant neoplasm of base of tongue: Secondary | ICD-10-CM

## 2020-07-01 DIAGNOSIS — E860 Dehydration: Secondary | ICD-10-CM

## 2020-07-01 DIAGNOSIS — Z5111 Encounter for antineoplastic chemotherapy: Secondary | ICD-10-CM | POA: Diagnosis not present

## 2020-07-01 MED ORDER — SODIUM CHLORIDE 0.9 % IV SOLN
Freq: Once | INTRAVENOUS | Status: AC
  Filled 2020-07-01: qty 250

## 2020-07-01 MED ORDER — POTASSIUM CHLORIDE IN NACL 20-0.9 MEQ/L-% IV SOLN
Freq: Once | INTRAVENOUS | Status: AC
  Filled 2020-07-01: qty 1000

## 2020-07-01 MED ORDER — SODIUM CHLORIDE 0.9% FLUSH
10.0000 mL | Freq: Once | INTRAVENOUS | Status: AC | PRN
  Administered 2020-07-01: 10 mL
  Filled 2020-07-01: qty 10

## 2020-07-01 MED ORDER — MAGNESIUM SULFATE 2 GM/50ML IV SOLN
2.0000 g | Freq: Once | INTRAVENOUS | Status: AC
  Administered 2020-07-01: 2 g via INTRAVENOUS
  Filled 2020-07-01: qty 50

## 2020-07-01 MED ORDER — HEPARIN SOD (PORK) LOCK FLUSH 100 UNIT/ML IV SOLN
500.0000 [IU] | Freq: Once | INTRAVENOUS | Status: AC | PRN
  Administered 2020-07-01: 500 [IU]
  Filled 2020-07-01: qty 5
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
## 2020-07-05 ENCOUNTER — Other Ambulatory Visit: Payer: Self-pay | Admitting: *Deleted

## 2020-07-05 ENCOUNTER — Inpatient Hospital Stay: Payer: 59

## 2020-07-05 ENCOUNTER — Other Ambulatory Visit: Payer: Self-pay

## 2020-07-05 VITALS — BP 150/85 | HR 86 | Temp 97.0°F | Resp 18

## 2020-07-05 DIAGNOSIS — T451X5A Adverse effect of antineoplastic and immunosuppressive drugs, initial encounter: Secondary | ICD-10-CM

## 2020-07-05 DIAGNOSIS — C01 Malignant neoplasm of base of tongue: Secondary | ICD-10-CM

## 2020-07-05 DIAGNOSIS — R110 Nausea: Secondary | ICD-10-CM

## 2020-07-05 DIAGNOSIS — Z5111 Encounter for antineoplastic chemotherapy: Secondary | ICD-10-CM | POA: Diagnosis not present

## 2020-07-05 MED ORDER — POTASSIUM CHLORIDE IN NACL 20-0.9 MEQ/L-% IV SOLN
Freq: Once | INTRAVENOUS | Status: AC
  Filled 2020-07-05: qty 1000

## 2020-07-05 MED ORDER — HEPARIN SOD (PORK) LOCK FLUSH 100 UNIT/ML IV SOLN
500.0000 [IU] | Freq: Once | INTRAVENOUS | Status: AC | PRN
  Administered 2020-07-05: 500 [IU]
  Filled 2020-07-05: qty 5

## 2020-07-05 MED ORDER — SODIUM CHLORIDE 0.9% FLUSH
10.0000 mL | Freq: Once | INTRAVENOUS | Status: AC | PRN
  Administered 2020-07-05: 10 mL
  Filled 2020-07-05: qty 10

## 2020-07-05 MED ORDER — ONDANSETRON HCL 8 MG PO TABS: 8.0000 mg | ORAL_TABLET | Freq: Three times a day (TID) | ORAL | 4 refills | Status: DC | PRN

## 2020-07-05 MED ORDER — MAGNESIUM SULFATE 2 GM/50ML IV SOLN
2.0000 g | Freq: Once | INTRAVENOUS | Status: AC
  Administered 2020-07-05: 2 g via INTRAVENOUS
  Filled 2020-07-05: qty 50

## 2020-07-05 MED FILL — ONDANSETRON HCL 8 MG TABLET: 21 days supply | Qty: 18 | Fill #0

## 2020-07-05 NOTE — Progress Notes (Addendum)
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  Patient has completed concurrent chemotherapy and radiation.    Met with patient during IV fluids.  Patient reports that for the past 11 days he has not really eaten anything due to gagging nature of mucous in throat.  Reports drinking about 1/2 of milky shake.  Drinking water during visit.  Coughing up mucous.  Reports that he continues with baking soda, salt water rinses.  Says that he is taking nausea medication. Patient reports some sore throat and no taste.   Says that he feels weak and knows importance of good nutrition.  "I know you are not going to be happy with me."  Medications: reviewed  Labs: reviewed  Anthropometrics:   Weight 285 lb on 2/23 decreased from 296 lb on 2/9  308 lb 2/2 311 lb 1/16 323 lb on 1/19 343 lb on 12/28 340 lb on 12/3   NUTRITION DIAGNOSIS: Predicted suboptimal energy intake continues    INTERVENTION:  Reports that this afternoon will try milky shake.  Provided samples of ensure clear (not as high in calories but thinner) and [employer] shake. Discussed option of boost soothe (300 calories and 10 g protein). Wrote information regarding ordering down for patient. Stressed importance of improving nutrition to help with recovery.  Patient states "I know what to do."   Patient has received call from Care [facility] and waiting on spot to open up.  Encouraged patient to participate.   Patient has contact information    MONITORING, EVALUATION, GOAL: weight trends, intake   NEXT VISIT: Monday, April 4th via phone. Patient will call RD and leave message regarding intake progress  Oziah Vitanza B. Zenia Resides, [place], [place] Registered Dietitian [phone] (mobile)

## 2020-07-05 NOTE — Progress Notes (Signed)
Received Fluids + electrolytes today. No complaints at time of discharge. VSS. Port needle removed per patient request. He comes back several days this week for IVF's.

## 2020-07-06 ENCOUNTER — Inpatient Hospital Stay: Payer: 59 | Attending: Radiation Oncology

## 2020-07-06 ENCOUNTER — Other Ambulatory Visit: Payer: Self-pay | Admitting: *Deleted

## 2020-07-06 DIAGNOSIS — K1230 Oral mucositis (ulcerative), unspecified: Secondary | ICD-10-CM | POA: Diagnosis not present

## 2020-07-06 DIAGNOSIS — Z5189 Encounter for other specified aftercare: Secondary | ICD-10-CM | POA: Diagnosis not present

## 2020-07-06 DIAGNOSIS — R112 Nausea with vomiting, unspecified: Secondary | ICD-10-CM | POA: Insufficient documentation

## 2020-07-06 DIAGNOSIS — C01 Malignant neoplasm of base of tongue: Secondary | ICD-10-CM

## 2020-07-06 DIAGNOSIS — Z923 Personal history of irradiation: Secondary | ICD-10-CM | POA: Diagnosis not present

## 2020-07-06 DIAGNOSIS — I959 Hypotension, unspecified: Secondary | ICD-10-CM | POA: Diagnosis not present

## 2020-07-06 DIAGNOSIS — E876 Hypokalemia: Secondary | ICD-10-CM | POA: Diagnosis present

## 2020-07-06 DIAGNOSIS — Z9221 Personal history of antineoplastic chemotherapy: Secondary | ICD-10-CM | POA: Diagnosis not present

## 2020-07-06 DIAGNOSIS — C770 Secondary and unspecified malignant neoplasm of lymph nodes of head, face and neck: Secondary | ICD-10-CM | POA: Insufficient documentation

## 2020-07-06 DIAGNOSIS — R55 Syncope and collapse: Secondary | ICD-10-CM | POA: Insufficient documentation

## 2020-07-06 DIAGNOSIS — Z95828 Presence of other vascular implants and grafts: Secondary | ICD-10-CM

## 2020-07-06 MED ORDER — SODIUM CHLORIDE 0.9% FLUSH
10.0000 mL | Freq: Once | INTRAVENOUS | Status: AC
  Administered 2020-07-06: 10 mL via INTRAVENOUS
  Filled 2020-07-06: qty 10

## 2020-07-06 MED ORDER — POTASSIUM CHLORIDE IN NACL 20-0.9 MEQ/L-% IV SOLN
INTRAVENOUS | Status: DC
  Filled 2020-07-06 (×2): qty 1000

## 2020-07-06 MED ORDER — MAGNESIUM SULFATE 2 GM/50ML IV SOLN
2.0000 g | Freq: Once | INTRAVENOUS | Status: AC
  Administered 2020-07-06: 2 g via INTRAVENOUS
  Filled 2020-07-06: qty 50

## 2020-07-06 MED ORDER — ONDANSETRON HCL 4 MG/2ML IJ SOLN
8.0000 mg | Freq: Once | INTRAMUSCULAR | Status: AC
  Administered 2020-07-06: 8 mg via INTRAVENOUS

## 2020-07-06 MED ORDER — HEPARIN SOD (PORK) LOCK FLUSH 100 UNIT/ML IV SOLN
500.0000 [IU] | Freq: Once | INTRAVENOUS | Status: AC
  Administered 2020-07-06: 500 [IU]
  Filled 2020-07-06: qty 5

## 2020-07-06 NOTE — Progress Notes (Signed)
Received IVF with Potassium and magnesium. VSS at discharge. Pt ambulatory.

## 2020-07-07 ENCOUNTER — Inpatient Hospital Stay: Payer: 59

## 2020-07-07 ENCOUNTER — Encounter: Payer: Self-pay | Admitting: Internal Medicine

## 2020-07-07 ENCOUNTER — Other Ambulatory Visit: Payer: Self-pay

## 2020-07-07 ENCOUNTER — Inpatient Hospital Stay (HOSPITAL_BASED_OUTPATIENT_CLINIC_OR_DEPARTMENT_OTHER): Payer: 59 | Admitting: Internal Medicine

## 2020-07-07 VITALS — BP 149/88 | HR 80 | Temp 97.4°F | Resp 18

## 2020-07-07 DIAGNOSIS — E876 Hypokalemia: Secondary | ICD-10-CM

## 2020-07-07 DIAGNOSIS — C01 Malignant neoplasm of base of tongue: Secondary | ICD-10-CM | POA: Diagnosis not present

## 2020-07-07 DIAGNOSIS — R112 Nausea with vomiting, unspecified: Secondary | ICD-10-CM

## 2020-07-07 LAB — CBC WITH DIFFERENTIAL/PLATELET
Abs Immature Granulocytes: 0.02 10*3/uL (ref 0.00–0.07)
Basophils Absolute: 0 10*3/uL (ref 0.0–0.1)
Basophils Relative: 1 %
Eosinophils Absolute: 0 10*3/uL (ref 0.0–0.5)
Eosinophils Relative: 0 %
HCT: 33 % — ABNORMAL LOW (ref 39.0–52.0)
Hemoglobin: 11.8 g/dL — ABNORMAL LOW (ref 13.0–17.0)
Immature Granulocytes: 1 %
Lymphocytes Relative: 12 %
Lymphs Abs: 0.3 10*3/uL — ABNORMAL LOW (ref 0.7–4.0)
MCH: 32.2 pg (ref 26.0–34.0)
MCHC: 35.8 g/dL (ref 30.0–36.0)
MCV: 89.9 fL (ref 80.0–100.0)
Monocytes Absolute: 0.6 10*3/uL (ref 0.1–1.0)
Monocytes Relative: 22 %
Neutro Abs: 1.7 10*3/uL (ref 1.7–7.7)
Neutrophils Relative %: 64 %
Platelets: 122 10*3/uL — ABNORMAL LOW (ref 150–400)
RBC: 3.67 MIL/uL — ABNORMAL LOW (ref 4.22–5.81)
RDW: 15.1 % (ref 11.5–15.5)
WBC: 2.7 10*3/uL — ABNORMAL LOW (ref 4.0–10.5)
nRBC: 0 % (ref 0.0–0.2)

## 2020-07-07 LAB — COMPREHENSIVE METABOLIC PANEL
ALT: 19 U/L (ref 0–44)
AST: 23 U/L (ref 15–41)
Albumin: 3.5 g/dL (ref 3.5–5.0)
Alkaline Phosphatase: 87 U/L (ref 38–126)
Anion gap: 15 (ref 5–15)
BUN: 18 mg/dL (ref 6–20)
CO2: 22 mmol/L (ref 22–32)
Calcium: 8.6 mg/dL — ABNORMAL LOW (ref 8.9–10.3)
Chloride: 99 mmol/L (ref 98–111)
Creatinine, Ser: 1.35 mg/dL — ABNORMAL HIGH (ref 0.61–1.24)
GFR, Estimated: >60 mL/min (ref >60)
Glucose, Bld: 119 mg/dL — ABNORMAL HIGH (ref 70–99)
Potassium: 3.5 mmol/L (ref 3.5–5.1)
Sodium: 136 mmol/L (ref 135–145)
Total Bilirubin: 1.5 mg/dL — ABNORMAL HIGH (ref 0.3–1.2)
Total Protein: 6.5 g/dL (ref 6.5–8.1)

## 2020-07-07 LAB — MAGNESIUM: Magnesium: 1.4 mg/dL — ABNORMAL LOW (ref 1.7–2.4)

## 2020-07-07 MED ORDER — MAGNESIUM SULFATE 2 GM/50ML IV SOLN
2.0000 g | Freq: Once | INTRAVENOUS | Status: AC
  Administered 2020-07-07: 2 g via INTRAVENOUS
  Filled 2020-07-07: qty 50

## 2020-07-07 MED ORDER — HEPARIN SOD (PORK) LOCK FLUSH 100 UNIT/ML IV SOLN
500.0000 [IU] | Freq: Once | INTRAVENOUS | Status: AC | PRN
  Administered 2020-07-07: 500 [IU]
  Filled 2020-07-07: qty 5

## 2020-07-07 MED ORDER — SODIUM CHLORIDE 0.9% FLUSH
10.0000 mL | Freq: Once | INTRAVENOUS | Status: AC | PRN
  Administered 2020-07-07: 10 mL
  Filled 2020-07-07: qty 10

## 2020-07-07 MED ORDER — POTASSIUM CHLORIDE IN NACL 20-0.9 MEQ/L-% IV SOLN
INTRAVENOUS | Status: DC
  Filled 2020-07-07 (×2): qty 1000

## 2020-07-07 NOTE — Progress Notes (Signed)
Despite normal level of Potassium today, Dr B wants patient to receive 20 mEq KCL in 1 liter of NS. Also received IV magnesium. Poor po intake continues. VSS at discharge. Ambulatory.

## 2020-07-07 NOTE — Patient Instructions (Signed)
#   recommend appt with Dr.Bennett in 3 weeks
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
## 2020-07-07 NOTE — Assessment & Plan Note (Addendum)
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   # will plan repeat PET in 8-12 weeks post chemo-RT; recommend apptwith Dr.Bennett  # Nausea/gagging-thick secretions- STABLE; sec to cisplatin/RT- continue zofran/compazine prn.   # Constipation: STABLE; continue with mirlaax/ glycerine suppository.   # Electrolyte abnormalities: Today potassium 3.5 magnesium 1.2.  Sec to cisplatin- creatinine-1.3 stable; Hypomag/hypokalemia/; continue good morning frequent IVFs-2 g magnesium 20 M EQ potassium.  Discussed regarding spacing of fluids/electrolytes.  # Mucositis- baking soda/salt water rinses/ therasol [MM-not covered]-WORSE- recommend Morphine liquid at home/nights [cant drive]-STABLE.   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    # Mediport/IV Access: STABLE.   # DISPOSITION: # IVFsMag/Kcl-over 2 hours-3/3; 3/4; 3/5; 03/07/ 03/08 # follow up in 1 week- MD; labs- cbc/cmp/mag;NO CHEMO. IVFs/ Mag 2 gm- Dr.B

## 2020-07-07 NOTE — Progress Notes (Signed)
Still not able to eat due to mucous production. States that his tongue is sore as well. He feels weak today due to lack of eating.

## 2020-07-08 ENCOUNTER — Other Ambulatory Visit: Payer: Self-pay

## 2020-07-08 ENCOUNTER — Inpatient Hospital Stay: Payer: 59

## 2020-07-08 VITALS — BP 151/88 | HR 81 | Temp 97.0°F

## 2020-07-08 DIAGNOSIS — R112 Nausea with vomiting, unspecified: Secondary | ICD-10-CM

## 2020-07-08 DIAGNOSIS — E876 Hypokalemia: Secondary | ICD-10-CM | POA: Diagnosis not present

## 2020-07-08 DIAGNOSIS — C01 Malignant neoplasm of base of tongue: Secondary | ICD-10-CM

## 2020-07-08 MED ORDER — ONDANSETRON HCL 4 MG/2ML IJ SOLN
8.0000 mg | Freq: Once | INTRAMUSCULAR | Status: AC
  Administered 2020-07-08: 8 mg via INTRAVENOUS
  Filled 2020-07-08: qty 4

## 2020-07-08 MED ORDER — MAGNESIUM SULFATE 2 GM/50ML IV SOLN
2.0000 g | Freq: Once | INTRAVENOUS | Status: AC
  Administered 2020-07-08: 2 g via INTRAVENOUS
  Filled 2020-07-08: qty 50

## 2020-07-08 MED ORDER — SODIUM CHLORIDE 0.9 % IV SOLN
Freq: Once | INTRAVENOUS | Status: DC
  Filled 2020-07-08: qty 250

## 2020-07-08 MED ORDER — SODIUM CHLORIDE 0.9% FLUSH
10.0000 mL | Freq: Once | INTRAVENOUS | Status: AC | PRN
  Administered 2020-07-08: 10 mL
  Filled 2020-07-08: qty 10

## 2020-07-08 MED ORDER — POTASSIUM CHLORIDE IN NACL 20-0.9 MEQ/L-% IV SOLN
INTRAVENOUS | Status: DC
  Filled 2020-07-08 (×2): qty 1000

## 2020-07-08 MED ORDER — HEPARIN SOD (PORK) LOCK FLUSH 100 UNIT/ML IV SOLN
500.0000 [IU] | Freq: Once | INTRAVENOUS | Status: AC | PRN
  Administered 2020-07-08: 500 [IU]
  Filled 2020-07-08: qty 5

## 2020-07-08 NOTE — Progress Notes (Signed)
Per Dr B please Give pt IVF's with potassium and magnesium today. Pt reports feeling better today. Not as weak and fatigued. He stayed awake through infusions today.Pt has agreed to leave port needle accessed over night. Discharged to home. VSS.

## 2020-07-09 ENCOUNTER — Inpatient Hospital Stay: Payer: 59

## 2020-07-09 DIAGNOSIS — E876 Hypokalemia: Secondary | ICD-10-CM

## 2020-07-09 DIAGNOSIS — C01 Malignant neoplasm of base of tongue: Secondary | ICD-10-CM

## 2020-07-09 DIAGNOSIS — R112 Nausea with vomiting, unspecified: Secondary | ICD-10-CM

## 2020-07-09 MED ORDER — MAGNESIUM SULFATE 2 GM/50ML IV SOLN
2.0000 g | Freq: Once | INTRAVENOUS | Status: AC
  Administered 2020-07-09: 2 g via INTRAVENOUS
  Filled 2020-07-09: qty 50

## 2020-07-09 MED ORDER — ONDANSETRON HCL 4 MG/2ML IJ SOLN: 8.0000 mg | Freq: Once | INTRAMUSCULAR | Status: DC

## 2020-07-09 MED ORDER — SODIUM CHLORIDE 0.9 % IV SOLN
Freq: Once | INTRAVENOUS | Status: AC
  Filled 2020-07-09: qty 250

## 2020-07-09 MED ORDER — POTASSIUM CHLORIDE IN NACL 20-0.9 MEQ/L-% IV SOLN
INTRAVENOUS | Status: DC
  Filled 2020-07-09 (×2): qty 1000

## 2020-07-09 MED ORDER — HEPARIN SOD (PORK) LOCK FLUSH 100 UNIT/ML IV SOLN
500.0000 [IU] | Freq: Once | INTRAVENOUS | Status: AC | PRN
  Administered 2020-07-09: 500 [IU]
  Filled 2020-07-09: qty 5

## 2020-07-09 NOTE — Progress Notes (Signed)
Patient tolerated IV fluids with Mg & KCL infusion well today, no concerns voiced. Patient discharged. Stable.

## 2020-07-12 ENCOUNTER — Other Ambulatory Visit: Payer: Self-pay

## 2020-07-12 ENCOUNTER — Inpatient Hospital Stay: Payer: 59

## 2020-07-12 ENCOUNTER — Inpatient Hospital Stay (HOSPITAL_BASED_OUTPATIENT_CLINIC_OR_DEPARTMENT_OTHER): Payer: 59 | Admitting: Nurse Practitioner

## 2020-07-12 VITALS — BP 144/82 | HR 79 | Temp 98.1°F | Resp 18

## 2020-07-12 DIAGNOSIS — R42 Dizziness and giddiness: Secondary | ICD-10-CM | POA: Diagnosis not present

## 2020-07-12 DIAGNOSIS — R55 Syncope and collapse: Secondary | ICD-10-CM | POA: Diagnosis not present

## 2020-07-12 DIAGNOSIS — E876 Hypokalemia: Secondary | ICD-10-CM | POA: Diagnosis not present

## 2020-07-12 DIAGNOSIS — C01 Malignant neoplasm of base of tongue: Secondary | ICD-10-CM | POA: Diagnosis not present

## 2020-07-12 DIAGNOSIS — R112 Nausea with vomiting, unspecified: Secondary | ICD-10-CM

## 2020-07-12 LAB — CBC WITH DIFFERENTIAL/PLATELET
Abs Immature Granulocytes: 0.03 10*3/uL (ref 0.00–0.07)
Basophils Absolute: 0 10*3/uL (ref 0.0–0.1)
Basophils Relative: 1 %
Eosinophils Absolute: 0 10*3/uL (ref 0.0–0.5)
Eosinophils Relative: 1 %
HCT: 29.9 % — ABNORMAL LOW (ref 39.0–52.0)
Hemoglobin: 10.6 g/dL — ABNORMAL LOW (ref 13.0–17.0)
Immature Granulocytes: 1 %
Lymphocytes Relative: 12 %
Lymphs Abs: 0.3 10*3/uL — ABNORMAL LOW (ref 0.7–4.0)
MCH: 31.9 pg (ref 26.0–34.0)
MCHC: 35.5 g/dL (ref 30.0–36.0)
MCV: 90.1 fL (ref 80.0–100.0)
Monocytes Absolute: 0.6 10*3/uL (ref 0.1–1.0)
Monocytes Relative: 21 %
Neutro Abs: 1.7 10*3/uL (ref 1.7–7.7)
Neutrophils Relative %: 64 %
Platelets: 99 10*3/uL — ABNORMAL LOW (ref 150–400)
RBC: 3.32 MIL/uL — ABNORMAL LOW (ref 4.22–5.81)
RDW: 16.3 % — ABNORMAL HIGH (ref 11.5–15.5)
WBC: 2.7 10*3/uL — ABNORMAL LOW (ref 4.0–10.5)
nRBC: 0 % (ref 0.0–0.2)

## 2020-07-12 LAB — TROPONIN I (HIGH SENSITIVITY): Troponin I (High Sensitivity): 11 ng/L (ref <18)

## 2020-07-12 LAB — COMPREHENSIVE METABOLIC PANEL
ALT: 14 U/L (ref 0–44)
AST: 17 U/L (ref 15–41)
Albumin: 2.9 g/dL — ABNORMAL LOW (ref 3.5–5.0)
Alkaline Phosphatase: 69 U/L (ref 38–126)
Anion gap: 16 — ABNORMAL HIGH (ref 5–15)
BUN: 18 mg/dL (ref 6–20)
CO2: 21 mmol/L — ABNORMAL LOW (ref 22–32)
Calcium: 8.1 mg/dL — ABNORMAL LOW (ref 8.9–10.3)
Chloride: 99 mmol/L (ref 98–111)
Creatinine, Ser: 1.18 mg/dL (ref 0.61–1.24)
GFR, Estimated: >60 mL/min (ref >60)
Glucose, Bld: 120 mg/dL — ABNORMAL HIGH (ref 70–99)
Potassium: 3.3 mmol/L — ABNORMAL LOW (ref 3.5–5.1)
Sodium: 136 mmol/L (ref 135–145)
Total Bilirubin: 1.8 mg/dL — ABNORMAL HIGH (ref 0.3–1.2)
Total Protein: 5.9 g/dL — ABNORMAL LOW (ref 6.5–8.1)

## 2020-07-12 LAB — CK: Total CK: 24 U/L — ABNORMAL LOW (ref 49–397)

## 2020-07-12 MED ORDER — MAGNESIUM SULFATE 2 GM/50ML IV SOLN
2.0000 g | Freq: Once | INTRAVENOUS | Status: AC
  Administered 2020-07-12: 2 g via INTRAVENOUS
  Filled 2020-07-12: qty 50

## 2020-07-12 MED ORDER — ONDANSETRON HCL 4 MG/2ML IJ SOLN
8.0000 mg | Freq: Once | INTRAMUSCULAR | Status: AC
  Administered 2020-07-12: 8 mg via INTRAVENOUS
  Filled 2020-07-12: qty 4

## 2020-07-12 MED ORDER — SODIUM CHLORIDE 0.9% FLUSH
10.0000 mL | Freq: Once | INTRAVENOUS | Status: AC | PRN
  Administered 2020-07-12: 10 mL
  Filled 2020-07-12: qty 10

## 2020-07-12 MED ORDER — POTASSIUM CHLORIDE IN NACL 20-0.9 MEQ/L-% IV SOLN
INTRAVENOUS | Status: DC
  Filled 2020-07-12 (×2): qty 1000

## 2020-07-12 MED ORDER — HEPARIN SOD (PORK) LOCK FLUSH 100 UNIT/ML IV SOLN
500.0000 [IU] | Freq: Once | INTRAVENOUS | Status: AC | PRN
Start: 2020-07-12 — End: 2020-07-12
  Administered 2020-07-12: 500 [IU]
  Filled 2020-07-12: qty 5

## 2020-07-12 MED ORDER — HEPARIN SOD (PORK) LOCK FLUSH 100 UNIT/ML IV SOLN
INTRAVENOUS | Status: AC
  Filled 2020-07-12: qty 5

## 2020-07-12 NOTE — Progress Notes (Signed)
Patient seen in fluid clinic today, Around 1245 while Mg and K was running, patient said he had a episode where he felt like he blacked out and his vision is blurry.  BP dropped to 72/58 & 68/54. MD notified. Patient seen in symptom management clinic. NP ordered EKG. Scanned in chart. 500 cc blous complete. At 1316 BP now 110/77. Stat labs ordered. Patient said he feels better and the episode passed, denies any chest pain or shortness of breath, stated he had some burning around port which has passed, RN got good blood return and assessed site clean, dry, intact. Mg and KCL complete. Zofran was also given. Patient encouraged and was able to drink clear ensure. Patient stable, BP 144/82 upon discharged. Patient due to come back to fluid clinic tomorrow. All questions answered. No concerns at this time.
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
## 2020-07-13 ENCOUNTER — Encounter: Payer: Self-pay | Admitting: Internal Medicine

## 2020-07-13 ENCOUNTER — Inpatient Hospital Stay: Payer: 59

## 2020-07-13 VITALS — BP 151/94 | HR 78 | Temp 98.1°F | Resp 17

## 2020-07-13 DIAGNOSIS — E876 Hypokalemia: Secondary | ICD-10-CM

## 2020-07-13 DIAGNOSIS — R112 Nausea with vomiting, unspecified: Secondary | ICD-10-CM

## 2020-07-13 DIAGNOSIS — C01 Malignant neoplasm of base of tongue: Secondary | ICD-10-CM

## 2020-07-13 LAB — MAGNESIUM: Magnesium: 1.4 mg/dL — ABNORMAL LOW (ref 1.7–2.4)

## 2020-07-13 MED ORDER — POTASSIUM CHLORIDE IN NACL 20-0.9 MEQ/L-% IV SOLN
INTRAVENOUS | Status: DC
  Filled 2020-07-13 (×2): qty 1000

## 2020-07-13 MED ORDER — HEPARIN SOD (PORK) LOCK FLUSH 100 UNIT/ML IV SOLN
500.0000 [IU] | Freq: Once | INTRAVENOUS | Status: AC | PRN
  Administered 2020-07-13: 500 [IU]
  Filled 2020-07-13: qty 5

## 2020-07-13 MED ORDER — MAGNESIUM SULFATE 2 GM/50ML IV SOLN
2.0000 g | Freq: Once | INTRAVENOUS | Status: AC
  Administered 2020-07-13: 2 g via INTRAVENOUS
  Filled 2020-07-13: qty 50

## 2020-07-13 MED ORDER — SODIUM CHLORIDE 0.9 % IV SOLN
Freq: Once | INTRAVENOUS | Status: AC
  Filled 2020-07-13: qty 250

## 2020-07-13 MED ORDER — SODIUM CHLORIDE 0.9% FLUSH
10.0000 mL | Freq: Once | INTRAVENOUS | Status: AC | PRN
  Administered 2020-07-13: 10 mL
  Filled 2020-07-13: qty 10

## 2020-07-13 MED ORDER — ONDANSETRON HCL 4 MG/2ML IJ SOLN
8.0000 mg | Freq: Once | INTRAMUSCULAR | Status: AC
  Administered 2020-07-13: 8 mg via INTRAVENOUS
  Filled 2020-07-13: qty 4

## 2020-07-13 NOTE — Progress Notes (Signed)
Pt received IV electrolytes and 2 liters of NS. No hypotension or feeling of dizziness today. VSS at time of discharge. Ambulatory.

## 2020-07-14 ENCOUNTER — Inpatient Hospital Stay: Payer: 59

## 2020-07-14 ENCOUNTER — Inpatient Hospital Stay (HOSPITAL_BASED_OUTPATIENT_CLINIC_OR_DEPARTMENT_OTHER): Payer: 59 | Admitting: Internal Medicine

## 2020-07-14 ENCOUNTER — Other Ambulatory Visit: Payer: Self-pay

## 2020-07-14 ENCOUNTER — Encounter: Payer: Self-pay | Admitting: Internal Medicine

## 2020-07-14 VITALS — BP 157/92 | HR 78 | Temp 97.1°F | Resp 17

## 2020-07-14 DIAGNOSIS — E876 Hypokalemia: Secondary | ICD-10-CM | POA: Diagnosis not present

## 2020-07-14 DIAGNOSIS — C01 Malignant neoplasm of base of tongue: Secondary | ICD-10-CM | POA: Diagnosis not present

## 2020-07-14 DIAGNOSIS — R112 Nausea with vomiting, unspecified: Secondary | ICD-10-CM

## 2020-07-14 MED ORDER — SODIUM CHLORIDE 0.9% FLUSH
10.0000 mL | Freq: Once | INTRAVENOUS | Status: AC | PRN
  Administered 2020-07-14: 10 mL
  Filled 2020-07-14: qty 10

## 2020-07-14 MED ORDER — ONDANSETRON HCL 4 MG/2ML IJ SOLN
8.0000 mg | Freq: Once | INTRAMUSCULAR | Status: AC
Start: 2020-07-14 — End: 2020-07-14
  Administered 2020-07-14: 8 mg via INTRAVENOUS

## 2020-07-14 MED ORDER — MAGNESIUM SULFATE 2 GM/50ML IV SOLN
2.0000 g | Freq: Once | INTRAVENOUS | Status: AC
  Administered 2020-07-14: 2 g via INTRAVENOUS
  Filled 2020-07-14: qty 50

## 2020-07-14 MED ORDER — POTASSIUM CHLORIDE IN NACL 20-0.9 MEQ/L-% IV SOLN
INTRAVENOUS | Status: DC
  Filled 2020-07-14 (×2): qty 1000

## 2020-07-14 MED ORDER — SODIUM CHLORIDE 0.9 % IV SOLN
Freq: Once | INTRAVENOUS | Status: AC
  Filled 2020-07-14: qty 250

## 2020-07-14 MED ORDER — HEPARIN SOD (PORK) LOCK FLUSH 100 UNIT/ML IV SOLN
500.0000 [IU] | Freq: Once | INTRAVENOUS | Status: AC | PRN
  Administered 2020-07-14: 500 [IU]
  Filled 2020-07-14: qty 5

## 2020-07-14 NOTE — Assessment & Plan Note (Addendum)
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   # will plan repeat PET in 8-12 weeks post chemo-RT; recommend appt with Dr.Bennett in next 2 weeks  # Presyncope/labile hypotension x1; EKG-  episode in clinic- improved s/p IVFs.; HOLD off amlodipine.    # Nausea/gagging-thick secretions- STABLE. sec to cisplatin/RT- continue zofran/compazine prn.   # Constipation: STABLE;; continue with mirlaax/ glycerine suppository.   # Electrolyte abnormalities: Today potassium 3.3 magnesium 1.4.  Sec to cisplatin- creatinine-1.3 STABLE Hypomag/hypokalemia/; continue good morning frequent IVFs-2 g magnesium 20 M EQ potassium.plan sIVFs this week; pt will call us if IVFs needed.    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    # Mediport/IV Access: STABLE.   # DISPOSITION: # IVFsMag/Kcl-over 2 hours-3/10; 3/11; # follow up in 2 week- MD; labs- cbc/cmp/mag; IVFs/ Mag 2 gm- Dr.B
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
## 2020-07-14 NOTE — Progress Notes (Signed)
Patient tolerated 2 liters of hydration with potassium and magnesium. VSS. Discharged to home. Ambulatory.

## 2020-07-14 NOTE — Patient Instructions (Signed)
recommend appt with Dr.Bennett

## 2020-07-15 ENCOUNTER — Inpatient Hospital Stay: Payer: 59

## 2020-07-15 VITALS — BP 150/80 | HR 80 | Temp 98.1°F | Resp 18

## 2020-07-15 DIAGNOSIS — E876 Hypokalemia: Secondary | ICD-10-CM

## 2020-07-15 DIAGNOSIS — R112 Nausea with vomiting, unspecified: Secondary | ICD-10-CM

## 2020-07-15 DIAGNOSIS — C01 Malignant neoplasm of base of tongue: Secondary | ICD-10-CM

## 2020-07-15 MED ORDER — POTASSIUM CHLORIDE IN NACL 20-0.9 MEQ/L-% IV SOLN
INTRAVENOUS | Status: DC
  Filled 2020-07-15 (×2): qty 1000

## 2020-07-15 MED ORDER — SODIUM CHLORIDE 0.9% FLUSH
10.0000 mL | Freq: Once | INTRAVENOUS | Status: AC | PRN
  Administered 2020-07-15: 10 mL
  Filled 2020-07-15: qty 10

## 2020-07-15 MED ORDER — ONDANSETRON HCL 4 MG/2ML IJ SOLN
8.0000 mg | Freq: Once | INTRAMUSCULAR | Status: AC
  Administered 2020-07-15: 8 mg via INTRAVENOUS
  Filled 2020-07-15: qty 4

## 2020-07-15 MED ORDER — HEPARIN SOD (PORK) LOCK FLUSH 100 UNIT/ML IV SOLN
500.0000 [IU] | Freq: Once | INTRAVENOUS | Status: AC | PRN
  Administered 2020-07-15: 500 [IU]
  Filled 2020-07-15: qty 5

## 2020-07-15 MED ORDER — MAGNESIUM SULFATE 2 GM/50ML IV SOLN
2.0000 g | Freq: Once | INTRAVENOUS | Status: AC
  Administered 2020-07-15: 2 g via INTRAVENOUS
  Filled 2020-07-15: qty 50

## 2020-07-15 MED ORDER — SODIUM CHLORIDE 0.9 % IV SOLN
Freq: Once | INTRAVENOUS | Status: AC
  Filled 2020-07-15: qty 250

## 2020-07-15 NOTE — Progress Notes (Signed)
Pt continues to receive daily hydration with 2 liters of NS and IV magnesium and IV potassium. VSS at discharge. Denies any dizziness or pain. Pt agreeable to leaving port accessed over night.

## 2020-07-16 ENCOUNTER — Inpatient Hospital Stay: Payer: 59

## 2020-07-16 ENCOUNTER — Other Ambulatory Visit: Payer: Self-pay

## 2020-07-16 VITALS — BP 158/93 | HR 83 | Temp 99.0°F | Resp 18

## 2020-07-16 DIAGNOSIS — C01 Malignant neoplasm of base of tongue: Secondary | ICD-10-CM

## 2020-07-16 DIAGNOSIS — R112 Nausea with vomiting, unspecified: Secondary | ICD-10-CM

## 2020-07-16 DIAGNOSIS — E876 Hypokalemia: Secondary | ICD-10-CM

## 2020-07-16 MED ORDER — HEPARIN SOD (PORK) LOCK FLUSH 100 UNIT/ML IV SOLN
500.0000 [IU] | Freq: Once | INTRAVENOUS | Status: AC | PRN
  Administered 2020-07-16: 500 [IU]
  Filled 2020-07-16: qty 5

## 2020-07-16 MED ORDER — POTASSIUM CHLORIDE IN NACL 20-0.9 MEQ/L-% IV SOLN
INTRAVENOUS | Status: DC
  Filled 2020-07-16 (×2): qty 1000

## 2020-07-16 MED ORDER — MAGNESIUM SULFATE 2 GM/50ML IV SOLN
2.0000 g | Freq: Once | INTRAVENOUS | Status: AC
  Administered 2020-07-16: 2 g via INTRAVENOUS
  Filled 2020-07-16: qty 50

## 2020-07-16 MED ORDER — SODIUM CHLORIDE 0.9 % IV SOLN
Freq: Once | INTRAVENOUS | Status: AC
  Filled 2020-07-16: qty 250

## 2020-07-16 MED ORDER — SODIUM CHLORIDE 0.9% FLUSH
10.0000 mL | Freq: Once | INTRAVENOUS | Status: AC | PRN
  Administered 2020-07-16: 10 mL
  Filled 2020-07-16: qty 10

## 2020-07-16 MED ORDER — ONDANSETRON HCL 4 MG/2ML IJ SOLN
8.0000 mg | Freq: Once | INTRAMUSCULAR | Status: AC
  Administered 2020-07-16: 8 mg via INTRAVENOUS
  Filled 2020-07-16: qty 4

## 2020-07-16 NOTE — Progress Notes (Signed)
Pt tolerated his IV hydration today with electrolytes. He has not initiated Boost or Ensure yet. He is drinking sips of water. Reports feeling better when he woke up this morning. Feeling well at time of discharge. VSS. Ambulatory.

## 2020-07-19 ENCOUNTER — Telehealth: Payer: Self-pay | Admitting: *Deleted

## 2020-07-19 ENCOUNTER — Encounter: Payer: Self-pay | Admitting: Internal Medicine

## 2020-07-19 DIAGNOSIS — R110 Nausea: Secondary | ICD-10-CM

## 2020-07-19 DIAGNOSIS — C01 Malignant neoplasm of base of tongue: Secondary | ICD-10-CM

## 2020-07-19 MED ORDER — ONDANSETRON HCL 8 MG PO TABS: 8.0000 mg | ORAL_TABLET | Freq: Three times a day (TID) | ORAL | 4 refills | Status: DC | PRN

## 2020-07-19 NOTE — Telephone Encounter (Signed)
Patient requested Rf for zofran

## 2020-07-20 MED FILL — ONDANSETRON HCL 8 MG TABLET: 10 days supply | Qty: 30 | Fill #0

## 2020-07-28 ENCOUNTER — Inpatient Hospital Stay (HOSPITAL_BASED_OUTPATIENT_CLINIC_OR_DEPARTMENT_OTHER): Payer: 59 | Admitting: Internal Medicine

## 2020-07-28 ENCOUNTER — Encounter: Payer: Self-pay | Admitting: Internal Medicine

## 2020-07-28 ENCOUNTER — Inpatient Hospital Stay: Payer: 59

## 2020-07-28 ENCOUNTER — Other Ambulatory Visit: Payer: Self-pay

## 2020-07-28 VITALS — BP 150/82 | HR 87 | Temp 97.4°F

## 2020-07-28 DIAGNOSIS — C01 Malignant neoplasm of base of tongue: Secondary | ICD-10-CM

## 2020-07-28 DIAGNOSIS — E876 Hypokalemia: Secondary | ICD-10-CM | POA: Diagnosis not present

## 2020-07-28 DIAGNOSIS — R112 Nausea with vomiting, unspecified: Secondary | ICD-10-CM

## 2020-07-28 LAB — COMPREHENSIVE METABOLIC PANEL
ALT: 14 U/L (ref 0–44)
AST: 27 U/L (ref 15–41)
Albumin: 3.8 g/dL (ref 3.5–5.0)
Alkaline Phosphatase: 77 U/L (ref 38–126)
Anion gap: 13 (ref 5–15)
BUN: 18 mg/dL (ref 6–20)
CO2: 32 mmol/L (ref 22–32)
Calcium: 8.9 mg/dL (ref 8.9–10.3)
Chloride: 86 mmol/L — ABNORMAL LOW (ref 98–111)
Creatinine, Ser: 1.72 mg/dL — ABNORMAL HIGH (ref 0.61–1.24)
GFR, Estimated: 46 mL/min — ABNORMAL LOW (ref >60)
Glucose, Bld: 140 mg/dL — ABNORMAL HIGH (ref 70–99)
Potassium: 2.8 mmol/L — ABNORMAL LOW (ref 3.5–5.1)
Sodium: 131 mmol/L — ABNORMAL LOW (ref 135–145)
Total Bilirubin: 2 mg/dL — ABNORMAL HIGH (ref 0.3–1.2)
Total Protein: 6.7 g/dL (ref 6.5–8.1)

## 2020-07-28 LAB — CBC WITH DIFFERENTIAL/PLATELET
Abs Immature Granulocytes: 0.07 10*3/uL (ref 0.00–0.07)
Basophils Absolute: 0 10*3/uL (ref 0.0–0.1)
Basophils Relative: 1 %
Eosinophils Absolute: 0.1 10*3/uL (ref 0.0–0.5)
Eosinophils Relative: 1 %
HCT: 31.8 % — ABNORMAL LOW (ref 39.0–52.0)
Hemoglobin: 11.4 g/dL — ABNORMAL LOW (ref 13.0–17.0)
Immature Granulocytes: 2 %
Lymphocytes Relative: 21 %
Lymphs Abs: 0.9 10*3/uL (ref 0.7–4.0)
MCH: 32.6 pg (ref 26.0–34.0)
MCHC: 35.8 g/dL (ref 30.0–36.0)
MCV: 90.9 fL (ref 80.0–100.0)
Monocytes Absolute: 0.9 10*3/uL (ref 0.1–1.0)
Monocytes Relative: 19 %
Neutro Abs: 2.6 10*3/uL (ref 1.7–7.7)
Neutrophils Relative %: 56 %
Platelets: 151 10*3/uL (ref 150–400)
RBC: 3.5 MIL/uL — ABNORMAL LOW (ref 4.22–5.81)
RDW: 16.5 % — ABNORMAL HIGH (ref 11.5–15.5)
WBC: 4.5 10*3/uL (ref 4.0–10.5)
nRBC: 1.6 % — ABNORMAL HIGH (ref 0.0–0.2)

## 2020-07-28 LAB — MAGNESIUM: Magnesium: 1.3 mg/dL — ABNORMAL LOW (ref 1.7–2.4)

## 2020-07-28 MED ORDER — MAGNESIUM SULFATE 2 GM/50ML IV SOLN
2.0000 g | Freq: Once | INTRAVENOUS | Status: AC
  Administered 2020-07-28: 2 g via INTRAVENOUS
  Filled 2020-07-28: qty 50

## 2020-07-28 MED ORDER — ONDANSETRON HCL 4 MG/2ML IJ SOLN
8.0000 mg | Freq: Once | INTRAMUSCULAR | Status: AC
  Administered 2020-07-28: 8 mg via INTRAVENOUS

## 2020-07-28 MED ORDER — POTASSIUM CHLORIDE IN NACL 40-0.9 MEQ/L-% IV SOLN
Freq: Once | INTRAVENOUS | Status: AC
  Filled 2020-07-28: qty 1000

## 2020-07-28 MED ORDER — SODIUM CHLORIDE 0.9% FLUSH
10.0000 mL | INTRAVENOUS | Status: DC | PRN
  Administered 2020-07-28: 10 mL via INTRAVENOUS
  Filled 2020-07-28: qty 10

## 2020-07-28 MED ORDER — HEPARIN SOD (PORK) LOCK FLUSH 100 UNIT/ML IV SOLN
500.0000 [IU] | Freq: Once | INTRAVENOUS | Status: AC
  Administered 2020-07-28: 500 [IU] via INTRAVENOUS
  Filled 2020-07-28: qty 5
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
## 2020-07-28 NOTE — Progress Notes (Signed)
Pt received IV hydration with 2 hours of Potassium and 4 grams of magnesium. Pt agreed to leave port needle accessed for return visit tomorrow. VSS at time of discharge.

## 2020-07-28 NOTE — Assessment & Plan Note (Addendum)
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 S/p evaluation with Dr.Bennett; referral to [facility] ENT. Will plan PET scan in last 1st week of May 2022 [~10 week post therapy]   #Dizziness/clinical dehydration-creatinine 1.7 [baseline 1.0-1.2]; potassium 2.8; magnesium 1.4-recommend IV fluids; 40 mg of potassium; 2 g of magnesium.  Continue to hold amlodipine.  Plan fluids daily.  # Nausea/gagging-thick secretions-stable sec to cisplatin/RT- continue zofran/compazine prn.   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    # Mediport/IV Access: STABLE.   # DISPOSITION: # IVFsMag/Kcl-over 2 hours today # I 3/24; 3/25; 3/28; 3/29- IVFsMag/Kcl-over 2 hours  # follow up on 3/30 MD; labs- cbc/cmp/mag; IVFs/ Mag 2 gm- Dr.B

## 2020-07-29 ENCOUNTER — Inpatient Hospital Stay: Payer: 59

## 2020-07-29 VITALS — BP 147/83 | HR 78 | Temp 97.0°F | Resp 18

## 2020-07-29 DIAGNOSIS — E876 Hypokalemia: Secondary | ICD-10-CM

## 2020-07-29 DIAGNOSIS — R112 Nausea with vomiting, unspecified: Secondary | ICD-10-CM

## 2020-07-29 DIAGNOSIS — C01 Malignant neoplasm of base of tongue: Secondary | ICD-10-CM

## 2020-07-29 MED ORDER — HEPARIN SOD (PORK) LOCK FLUSH 100 UNIT/ML IV SOLN
500.0000 [IU] | Freq: Once | INTRAVENOUS | Status: AC | PRN
  Administered 2020-07-29: 500 [IU]
  Filled 2020-07-29: qty 5

## 2020-07-29 MED ORDER — SODIUM CHLORIDE 0.9 % IV SOLN
Freq: Once | INTRAVENOUS | Status: AC
  Filled 2020-07-29: qty 250

## 2020-07-29 MED ORDER — ONDANSETRON HCL 4 MG/2ML IJ SOLN
8.0000 mg | Freq: Once | INTRAMUSCULAR | Status: AC
Start: 2020-07-29 — End: 2020-07-29
  Administered 2020-07-29: 8 mg via INTRAVENOUS
  Filled 2020-07-29: qty 4

## 2020-07-29 MED ORDER — POTASSIUM CHLORIDE IN NACL 20-0.9 MEQ/L-% IV SOLN
INTRAVENOUS | Status: DC
  Filled 2020-07-29 (×2): qty 1000

## 2020-07-29 MED ORDER — SODIUM CHLORIDE 0.9% FLUSH
10.0000 mL | Freq: Once | INTRAVENOUS | Status: AC | PRN
  Administered 2020-07-29: 10 mL
  Filled 2020-07-29: qty 10

## 2020-07-29 MED ORDER — MAGNESIUM SULFATE 2 GM/50ML IV SOLN
2.0000 g | Freq: Once | INTRAVENOUS | Status: AC
Start: 2020-07-29 — End: 2020-07-29
  Administered 2020-07-29: 2 g via INTRAVENOUS
  Filled 2020-07-29: qty 50

## 2020-07-29 NOTE — Progress Notes (Signed)
Pt had an episode of N/V x 1 this morning. He does state he feels a little better today over all. Is starting to experience some neuropathy in L ball of foot and his toes. Receiving IV hydration with addition of potassium and magnesium. Discharged to home. VSS. Ambulatory.

## 2020-07-30 ENCOUNTER — Inpatient Hospital Stay: Payer: 59

## 2020-07-30 VITALS — BP 156/88 | HR 89 | Temp 98.1°F | Resp 18

## 2020-07-30 DIAGNOSIS — R112 Nausea with vomiting, unspecified: Secondary | ICD-10-CM

## 2020-07-30 DIAGNOSIS — E876 Hypokalemia: Secondary | ICD-10-CM

## 2020-07-30 DIAGNOSIS — C01 Malignant neoplasm of base of tongue: Secondary | ICD-10-CM

## 2020-07-30 MED ORDER — ONDANSETRON HCL 4 MG/2ML IJ SOLN
8.0000 mg | Freq: Once | INTRAMUSCULAR | Status: AC
  Administered 2020-07-30: 8 mg via INTRAVENOUS

## 2020-07-30 MED ORDER — HEPARIN SOD (PORK) LOCK FLUSH 100 UNIT/ML IV SOLN
500.0000 [IU] | Freq: Once | INTRAVENOUS | Status: AC | PRN
  Administered 2020-07-30: 500 [IU]
  Filled 2020-07-30: qty 5

## 2020-07-30 MED ORDER — SODIUM CHLORIDE 0.9% FLUSH
10.0000 mL | Freq: Once | INTRAVENOUS | Status: AC | PRN
  Administered 2020-07-30: 10 mL
  Filled 2020-07-30: qty 10

## 2020-07-30 MED ORDER — MAGNESIUM SULFATE 2 GM/50ML IV SOLN
2.0000 g | Freq: Once | INTRAVENOUS | Status: AC
  Administered 2020-07-30: 2 g via INTRAVENOUS
  Filled 2020-07-30: qty 50

## 2020-07-30 MED ORDER — POTASSIUM CHLORIDE IN NACL 20-0.9 MEQ/L-% IV SOLN
INTRAVENOUS | Status: DC
  Filled 2020-07-30 (×2): qty 1000

## 2020-07-30 NOTE — Progress Notes (Signed)
In clinic for IV hydration in addition to electrolytes. Tolerated infusions well. Discharged to home.

## 2020-08-02 ENCOUNTER — Encounter: Payer: Self-pay | Admitting: Radiation Oncology

## 2020-08-02 ENCOUNTER — Ambulatory Visit
Admission: RE | Admit: 2020-08-02 | Discharge: 2020-08-02 | Disposition: A | Discharge status: Home / Self Care | Payer: 59 | Source: Ambulatory Visit | Attending: Radiation Oncology | Admitting: Radiation Oncology

## 2020-08-02 ENCOUNTER — Inpatient Hospital Stay: Payer: 59

## 2020-08-02 ENCOUNTER — Other Ambulatory Visit: Payer: Self-pay

## 2020-08-02 VITALS — BP 121/103 | HR 125 | Temp 97.5°F | Wt 265.0 lb

## 2020-08-02 VITALS — BP 150/89 | HR 85 | Temp 98.9°F | Resp 18

## 2020-08-02 DIAGNOSIS — R112 Nausea with vomiting, unspecified: Secondary | ICD-10-CM

## 2020-08-02 DIAGNOSIS — C01 Malignant neoplasm of base of tongue: Secondary | ICD-10-CM

## 2020-08-02 DIAGNOSIS — Z923 Personal history of irradiation: Secondary | ICD-10-CM | POA: Insufficient documentation

## 2020-08-02 DIAGNOSIS — E876 Hypokalemia: Secondary | ICD-10-CM | POA: Diagnosis not present

## 2020-08-02 DIAGNOSIS — Z9221 Personal history of antineoplastic chemotherapy: Secondary | ICD-10-CM | POA: Insufficient documentation

## 2020-08-02 MED ORDER — MAGNESIUM SULFATE 2 GM/50ML IV SOLN
2.0000 g | Freq: Once | INTRAVENOUS | Status: AC
  Administered 2020-08-02: 2 g via INTRAVENOUS
  Filled 2020-08-02: qty 50

## 2020-08-02 MED ORDER — POTASSIUM CHLORIDE IN NACL 20-0.9 MEQ/L-% IV SOLN
INTRAVENOUS | Status: DC
  Filled 2020-08-02 (×2): qty 1000

## 2020-08-02 MED ORDER — HEPARIN SOD (PORK) LOCK FLUSH 100 UNIT/ML IV SOLN
500.0000 [IU] | Freq: Once | INTRAVENOUS | Status: AC | PRN
  Administered 2020-08-02: 500 [IU]
  Filled 2020-08-02: qty 5

## 2020-08-02 MED ORDER — ONDANSETRON HCL 4 MG/2ML IJ SOLN
8.0000 mg | Freq: Once | INTRAMUSCULAR | Status: AC
  Administered 2020-08-02: 8 mg via INTRAVENOUS

## 2020-08-02 MED ORDER — SODIUM CHLORIDE 0.9% FLUSH
10.0000 mL | Freq: Once | INTRAVENOUS | Status: AC | PRN
  Administered 2020-08-02: 10 mL
  Filled 2020-08-02: qty 10

## 2020-08-02 NOTE — Progress Notes (Signed)
Radiation Oncology Follow up Note  Name: Joshua Bray   Date:   08/02/2020 MRN:  [id number] DOB: 04-09-1961    This 60 y.o. 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  REFERRING PROVIDER: Verl Bangs, FNP  HPI: Patient is a 60 year old male now out 1 month having completed concurrent chemoradiation therapy for stage IVb squamous cell carcinoma the base of tongue.  He had massive adenopathy of the neck.  He is seen today in routine follow-up still having some thick secretions.  No significant dysphagia.  He still has persistent enlarged nodes in his neck which may be residual scarring versus residual cancer.  He is being planned for a PET CT scan and has been referred to [facility] for evaluation for possible neck dissection with ENT..  COMPLICATIONS OF TREATMENT: none  FOLLOW UP COMPLIANCE: keeps appointments   PHYSICAL EXAM:  BP (!) 121/103   Pulse (!) 125   Temp (!) 97.5 F (36.4 C) (Tympanic)   Wt 265 lb (120.2 kg)   BMI 36.96 kg/m  Patient has large adenopathy in the left side and some smaller nodes on the right neck.  Well-developed well-nourished patient in NAD. HEENT reveals PERLA, EOMI, discs not visualized.  Oral cavity is clear. No oral mucosal lesions are identified. Neck is clear without evidence of cervical or supraclavicular adenopathy. Lungs are clear to A&P. Cardiac examination is essentially unremarkable with regular rate and rhythm without murmur rub or thrill. Abdomen is benign with no organomegaly or masses noted. Motor sensory and DTR levels are equal and symmetric in the upper and lower extremities. Cranial nerves II through XII are grossly intact. Proprioception is intact. No peripheral adenopathy or edema is identified. No motor or sensory levels are noted. Crude visual  fields are within normal range.  RADIOLOGY RESULTS: No current films for review  PLAN: Present time I put agree with the treatment plan for referral to [facility] for possible neck dissection.  Would wait till after PET CT scan to make a determination on exact extent of surgery.  Otherwise am pleased with his overall progress his side effects are resolving and taste is returning.  I have asked to see him back in 3 to 4 months for follow-up.  Patient knows to call with any concerns.  I would like to take this opportunity to thank you for allowing me to participate in the care of your patient.Noreene Filbert, MD

## 2020-08-02 NOTE — Progress Notes (Signed)
Pt received IVF's with electrolytes. VSS. Discharged to home. Pt ambulatory.

## 2020-08-03 ENCOUNTER — Inpatient Hospital Stay: Payer: 59

## 2020-08-03 VITALS — BP 134/75 | HR 82 | Temp 98.0°F | Resp 18

## 2020-08-03 DIAGNOSIS — C01 Malignant neoplasm of base of tongue: Secondary | ICD-10-CM

## 2020-08-03 DIAGNOSIS — R112 Nausea with vomiting, unspecified: Secondary | ICD-10-CM

## 2020-08-03 DIAGNOSIS — E876 Hypokalemia: Secondary | ICD-10-CM | POA: Diagnosis not present

## 2020-08-03 MED ORDER — SODIUM CHLORIDE 0.9% FLUSH
10.0000 mL | Freq: Once | INTRAVENOUS | Status: AC | PRN
  Administered 2020-08-03: 10 mL
  Filled 2020-08-03: qty 10

## 2020-08-03 MED ORDER — POTASSIUM CHLORIDE IN NACL 20-0.9 MEQ/L-% IV SOLN
INTRAVENOUS | Status: DC
  Filled 2020-08-03 (×2): qty 1000

## 2020-08-03 MED ORDER — ONDANSETRON HCL 4 MG/2ML IJ SOLN
8.0000 mg | Freq: Once | INTRAMUSCULAR | Status: AC
  Administered 2020-08-03: 8 mg via INTRAVENOUS

## 2020-08-03 MED ORDER — MAGNESIUM SULFATE 2 GM/50ML IV SOLN
2.0000 g | Freq: Once | INTRAVENOUS | Status: AC
  Administered 2020-08-03: 2 g via INTRAVENOUS
  Filled 2020-08-03: qty 50

## 2020-08-03 MED ORDER — HEPARIN SOD (PORK) LOCK FLUSH 100 UNIT/ML IV SOLN
500.0000 [IU] | Freq: Once | INTRAVENOUS | Status: AC | PRN
  Administered 2020-08-03: 500 [IU]
  Filled 2020-08-03: qty 5

## 2020-08-03 NOTE — Progress Notes (Signed)
IVF's with magnesium and potassium. Pt improving. Requested removal of port needle so he can shower. VSS. No complaints at discharge.

## 2020-08-04 ENCOUNTER — Inpatient Hospital Stay: Payer: 59

## 2020-08-04 ENCOUNTER — Inpatient Hospital Stay (HOSPITAL_BASED_OUTPATIENT_CLINIC_OR_DEPARTMENT_OTHER): Payer: 59 | Admitting: Internal Medicine

## 2020-08-04 ENCOUNTER — Other Ambulatory Visit: Payer: Self-pay

## 2020-08-04 VITALS — BP 110/82 | HR 99 | Temp 99.0°F | Resp 18

## 2020-08-04 DIAGNOSIS — C01 Malignant neoplasm of base of tongue: Secondary | ICD-10-CM | POA: Diagnosis not present

## 2020-08-04 DIAGNOSIS — E876 Hypokalemia: Secondary | ICD-10-CM | POA: Diagnosis not present

## 2020-08-04 DIAGNOSIS — R112 Nausea with vomiting, unspecified: Secondary | ICD-10-CM

## 2020-08-04 LAB — CBC WITH DIFFERENTIAL/PLATELET
Abs Immature Granulocytes: 0.04 10*3/uL (ref 0.00–0.07)
Basophils Absolute: 0 10*3/uL (ref 0.0–0.1)
Basophils Relative: 1 %
Eosinophils Absolute: 0.1 10*3/uL (ref 0.0–0.5)
Eosinophils Relative: 3 %
HCT: 24.7 % — ABNORMAL LOW (ref 39.0–52.0)
Hemoglobin: 8.7 g/dL — ABNORMAL LOW (ref 13.0–17.0)
Immature Granulocytes: 1 %
Lymphocytes Relative: 25 %
Lymphs Abs: 0.9 10*3/uL (ref 0.7–4.0)
MCH: 33 pg (ref 26.0–34.0)
MCHC: 35.2 g/dL (ref 30.0–36.0)
MCV: 93.6 fL (ref 80.0–100.0)
Monocytes Absolute: 0.6 10*3/uL (ref 0.1–1.0)
Monocytes Relative: 16 %
Neutro Abs: 1.9 10*3/uL (ref 1.7–7.7)
Neutrophils Relative %: 54 %
Platelets: 130 10*3/uL — ABNORMAL LOW (ref 150–400)
RBC: 2.64 MIL/uL — ABNORMAL LOW (ref 4.22–5.81)
RDW: 18.3 % — ABNORMAL HIGH (ref 11.5–15.5)
WBC: 3.6 10*3/uL — ABNORMAL LOW (ref 4.0–10.5)
nRBC: 1.1 % — ABNORMAL HIGH (ref 0.0–0.2)

## 2020-08-04 LAB — MAGNESIUM: Magnesium: 1.9 mg/dL (ref 1.7–2.4)

## 2020-08-04 LAB — COMPREHENSIVE METABOLIC PANEL
ALT: 13 U/L (ref 0–44)
AST: 24 U/L (ref 15–41)
Albumin: 3.1 g/dL — ABNORMAL LOW (ref 3.5–5.0)
Alkaline Phosphatase: 86 U/L (ref 38–126)
Anion gap: 9 (ref 5–15)
BUN: 16 mg/dL (ref 6–20)
CO2: 26 mmol/L (ref 22–32)
Calcium: 8.5 mg/dL — ABNORMAL LOW (ref 8.9–10.3)
Chloride: 99 mmol/L (ref 98–111)
Creatinine, Ser: 1.65 mg/dL — ABNORMAL HIGH (ref 0.61–1.24)
GFR, Estimated: 48 mL/min — ABNORMAL LOW (ref >60)
Glucose, Bld: 148 mg/dL — ABNORMAL HIGH (ref 70–99)
Potassium: 3.9 mmol/L (ref 3.5–5.1)
Sodium: 134 mmol/L — ABNORMAL LOW (ref 135–145)
Total Bilirubin: 0.7 mg/dL (ref 0.3–1.2)
Total Protein: 5.8 g/dL — ABNORMAL LOW (ref 6.5–8.1)

## 2020-08-04 MED ORDER — SODIUM CHLORIDE 0.9 % IV SOLN
Freq: Once | INTRAVENOUS | Status: AC
  Filled 2020-08-04: qty 250

## 2020-08-04 MED ORDER — SODIUM CHLORIDE 0.9% FLUSH
10.0000 mL | Freq: Once | INTRAVENOUS | Status: AC | PRN
  Administered 2020-08-04: 10 mL
  Filled 2020-08-04: qty 10

## 2020-08-04 MED ORDER — HEPARIN SOD (PORK) LOCK FLUSH 100 UNIT/ML IV SOLN
500.0000 [IU] | Freq: Once | INTRAVENOUS | Status: AC | PRN
  Administered 2020-08-04: 500 [IU]
  Filled 2020-08-04: qty 5

## 2020-08-04 NOTE — Progress Notes (Signed)
Receiving IV hydration with 1 liter of NS. No additional electrolytes needed today. Pt continuing to improve with po intake. Discharged to home at completion of fluids.

## 2020-08-04 NOTE — Progress Notes (Signed)
Feeling much better. Eating and drinking now. Here for MD visit and IVF's.
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
## 2020-08-04 NOTE — Assessment & Plan Note (Addendum)
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 S/p evaluation with Dr.Bennett; awaiting referral to [facility] ENT.   # Will plan PET scan in last 1st week of May 2022 [~10 week post therapy]; will order PET at next visit.   # Dehydration/acute renal failure--creatinine 1.6  [baseline 1.0-1.2]; potassium 3.9; magnesium 1.9-recommend IV fluids weekly. Continue to hold amlodipine. NO nephrotoxic agents.   # Mucositis- baking soda/salt water rinses/ therasol [MM-not covered]-   Improved; not on any pain medications.     # Mediport/IV Access: STABLE.   # DISPOSITION: # IVF 1 lit today # in 1 week; and 2 weeks- labs- cbc/bmp;mag--IVFs; possible 2gmMag/20 Kcl- # follow up in 3 weeks MD; labs- cbc/cmp/mag;possible IVFs/ Mag 2 gm- Dr.B

## 2020-08-05 ENCOUNTER — Encounter: Payer: 59 | Admitting: Nurse Practitioner

## 2020-08-05 ENCOUNTER — Encounter: Payer: Self-pay | Admitting: Internal Medicine

## 2020-08-05 ENCOUNTER — Telehealth: Payer: Self-pay

## 2020-08-05 NOTE — Telephone Encounter (Signed)
During my weekly visits with Dr B, he always checks my legs/calves for swelling and up until now, there has never been an issue. Last night I was sitting at the dining table for about an hour an began to have some pain and tenderness in my ankles. Upon inspection, I noticed they were very swollen and very painful to the touch. I laid down on the couch and slept the night with my feet elevated and the swelling has reduced, but they are still a bit painful and quite warm compared to the rest of my leg. I'm wondering if this is related to my recent week long consumption of ice tea/caffeine and elevated kidney numbers. Please advise as to any steps to further alleviate the situation or possible return to the cancer center for evaluation. Thank you.     -Patient sent this mychart message. Called pt per Dr. B and told him to come in to see [facility] tomorrow at 10:30. Pt in agreement.

## 2020-08-06 ENCOUNTER — Inpatient Hospital Stay: Payer: 59 | Attending: Nurse Practitioner | Admitting: Nurse Practitioner

## 2020-08-06 ENCOUNTER — Encounter: Payer: Self-pay | Admitting: Nurse Practitioner

## 2020-08-06 ENCOUNTER — Other Ambulatory Visit: Payer: Self-pay

## 2020-08-06 VITALS — BP 126/73 | HR 86 | Temp 96.8°F | Resp 20 | Ht 71.0 in | Wt 275.0 lb

## 2020-08-06 DIAGNOSIS — R609 Edema, unspecified: Secondary | ICD-10-CM | POA: Diagnosis not present

## 2020-08-06 DIAGNOSIS — D649 Anemia, unspecified: Secondary | ICD-10-CM | POA: Insufficient documentation

## 2020-08-06 DIAGNOSIS — I871 Compression of vein: Secondary | ICD-10-CM | POA: Diagnosis not present

## 2020-08-06 DIAGNOSIS — C01 Malignant neoplasm of base of tongue: Secondary | ICD-10-CM | POA: Diagnosis present

## 2020-08-06 DIAGNOSIS — Z7901 Long term (current) use of anticoagulants: Secondary | ICD-10-CM | POA: Diagnosis not present

## 2020-08-06 DIAGNOSIS — Z87891 Personal history of nicotine dependence: Secondary | ICD-10-CM | POA: Insufficient documentation

## 2020-08-06 DIAGNOSIS — I872 Venous insufficiency (chronic) (peripheral): Secondary | ICD-10-CM | POA: Diagnosis not present

## 2020-08-06 DIAGNOSIS — E119 Type 2 diabetes mellitus without complications: Secondary | ICD-10-CM | POA: Diagnosis not present

## 2020-08-06 NOTE — Patient Instructions (Addendum)
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
## 2020-08-09 ENCOUNTER — Inpatient Hospital Stay: Payer: 59

## 2020-08-09 NOTE — Progress Notes (Signed)
Nutrition Follow-up:  Patient with base of the tongue cancer with bilateral lymphadenopathy.  Patient has completed chemotherapy and radiation.    Spoke with patient via phone for nutrition follow-up.  Patient reports that appetite is better, taste buds are improving, and having less nausea.  Still has mucus collection in back of throat but better.  Eating soups, softer meats, pies.    Medications: reviewed  Labs: reviewed  Anthropometrics:   Weight 275 lb on 4/1  285 lb on 2/23 296 lb on 2/9 308 lb on 2/2 311 lb on 1/16 343 on 12/28 340 lb on 12/3   NUTRITION DIAGNOSIS: Predicted suboptimal energy intake improving   INTERVENTION:  Encouraged patient to continue to focus on foods high in protein     MONITORING, EVALUATION, GOAL: weight trends, intake   NEXT VISIT: May 2nd, phone call  Arwa Yero B. Zenia Resides, [place], [place] Registered Dietitian [phone] (mobile)

## 2020-08-11 ENCOUNTER — Other Ambulatory Visit: Payer: Self-pay

## 2020-08-11 ENCOUNTER — Inpatient Hospital Stay: Payer: 59

## 2020-08-11 VITALS — BP 100/72 | HR 99 | Temp 97.8°F | Resp 20 | Wt 266.0 lb

## 2020-08-11 DIAGNOSIS — C01 Malignant neoplasm of base of tongue: Secondary | ICD-10-CM | POA: Diagnosis not present

## 2020-08-11 DIAGNOSIS — E876 Hypokalemia: Secondary | ICD-10-CM

## 2020-08-11 DIAGNOSIS — R112 Nausea with vomiting, unspecified: Secondary | ICD-10-CM

## 2020-08-11 LAB — CBC WITH DIFFERENTIAL/PLATELET
Abs Immature Granulocytes: 0.02 10*3/uL (ref 0.00–0.07)
Basophils Absolute: 0 10*3/uL (ref 0.0–0.1)
Basophils Relative: 1 %
Eosinophils Absolute: 0.1 10*3/uL (ref 0.0–0.5)
Eosinophils Relative: 3 %
HCT: 27.7 % — ABNORMAL LOW (ref 39.0–52.0)
Hemoglobin: 9.6 g/dL — ABNORMAL LOW (ref 13.0–17.0)
Immature Granulocytes: 1 %
Lymphocytes Relative: 25 %
Lymphs Abs: 1 10*3/uL (ref 0.7–4.0)
MCH: 34.4 pg — ABNORMAL HIGH (ref 26.0–34.0)
MCHC: 34.7 g/dL (ref 30.0–36.0)
MCV: 99.3 fL (ref 80.0–100.0)
Monocytes Absolute: 0.6 10*3/uL (ref 0.1–1.0)
Monocytes Relative: 14 %
Neutro Abs: 2.3 10*3/uL (ref 1.7–7.7)
Neutrophils Relative %: 56 %
Platelets: 164 10*3/uL (ref 150–400)
RBC: 2.79 MIL/uL — ABNORMAL LOW (ref 4.22–5.81)
RDW: 19.7 % — ABNORMAL HIGH (ref 11.5–15.5)
WBC: 4.1 10*3/uL (ref 4.0–10.5)
nRBC: 0 % (ref 0.0–0.2)

## 2020-08-11 LAB — COMPREHENSIVE METABOLIC PANEL
ALT: 10 U/L (ref 0–44)
AST: 22 U/L (ref 15–41)
Albumin: 3.2 g/dL — ABNORMAL LOW (ref 3.5–5.0)
Alkaline Phosphatase: 83 U/L (ref 38–126)
Anion gap: 10 (ref 5–15)
BUN: 18 mg/dL (ref 6–20)
CO2: 25 mmol/L (ref 22–32)
Calcium: 8.8 mg/dL — ABNORMAL LOW (ref 8.9–10.3)
Chloride: 101 mmol/L (ref 98–111)
Creatinine, Ser: 1.48 mg/dL — ABNORMAL HIGH (ref 0.61–1.24)
GFR, Estimated: 55 mL/min — ABNORMAL LOW (ref >60)
Glucose, Bld: 122 mg/dL — ABNORMAL HIGH (ref 70–99)
Potassium: 4.1 mmol/L (ref 3.5–5.1)
Sodium: 136 mmol/L (ref 135–145)
Total Bilirubin: 0.7 mg/dL (ref 0.3–1.2)
Total Protein: 6.2 g/dL — ABNORMAL LOW (ref 6.5–8.1)

## 2020-08-11 LAB — MAGNESIUM: Magnesium: 1.8 mg/dL (ref 1.7–2.4)

## 2020-08-11 MED ORDER — HEPARIN SOD (PORK) LOCK FLUSH 100 UNIT/ML IV SOLN
500.0000 [IU] | Freq: Once | INTRAVENOUS | Status: AC | PRN
  Administered 2020-08-11: 500 [IU]
  Filled 2020-08-11: qty 5

## 2020-08-11 MED ORDER — SODIUM CHLORIDE 0.9% FLUSH
10.0000 mL | Freq: Once | INTRAVENOUS | Status: AC | PRN
  Administered 2020-08-11: 10 mL
  Filled 2020-08-11: qty 10

## 2020-08-11 MED ORDER — SODIUM CHLORIDE 0.9 % IV SOLN
Freq: Once | INTRAVENOUS | Status: AC
  Filled 2020-08-11: qty 250

## 2020-08-11 NOTE — Progress Notes (Signed)
Pt here for labs and IVFs. Discussed lab results with patient. Pt will receive 1 liter of NS for hydration. Pt continues to eat soups and drink ensure. He is missing about 5 teeth and has no saliva which is limiting what he can actually eat and swallow. He feels hungry and desires to eat and drink. He is increasing his po intake. Discharged to home. VSS.

## 2020-08-14 ENCOUNTER — Inpatient Hospital Stay
Admission: EM | Admit: 2020-08-14 | Discharge: 2020-08-17 | DRG: 175 | Disposition: A | Discharge status: Home / Self Care | Payer: 59 | Attending: Internal Medicine | Admitting: Internal Medicine

## 2020-08-14 ENCOUNTER — Other Ambulatory Visit: Payer: Self-pay

## 2020-08-14 ENCOUNTER — Emergency Department: Payer: 59

## 2020-08-14 DIAGNOSIS — Z923 Personal history of irradiation: Secondary | ICD-10-CM

## 2020-08-14 DIAGNOSIS — E1165 Type 2 diabetes mellitus with hyperglycemia: Secondary | ICD-10-CM

## 2020-08-14 DIAGNOSIS — I2693 Single subsegmental pulmonary embolism without acute cor pulmonale: Principal | ICD-10-CM | POA: Diagnosis present

## 2020-08-14 DIAGNOSIS — I2699 Other pulmonary embolism without acute cor pulmonale: Secondary | ICD-10-CM | POA: Diagnosis not present

## 2020-08-14 DIAGNOSIS — Z79891 Long term (current) use of opiate analgesic: Secondary | ICD-10-CM

## 2020-08-14 DIAGNOSIS — E119 Type 2 diabetes mellitus without complications: Secondary | ICD-10-CM | POA: Diagnosis present

## 2020-08-14 DIAGNOSIS — K219 Gastro-esophageal reflux disease without esophagitis: Secondary | ICD-10-CM | POA: Diagnosis present

## 2020-08-14 DIAGNOSIS — Z6838 Body mass index (BMI) 38.0-38.9, adult: Secondary | ICD-10-CM

## 2020-08-14 DIAGNOSIS — Z87891 Personal history of nicotine dependence: Secondary | ICD-10-CM

## 2020-08-14 DIAGNOSIS — E785 Hyperlipidemia, unspecified: Secondary | ICD-10-CM | POA: Diagnosis present

## 2020-08-14 DIAGNOSIS — Z20822 Contact with and (suspected) exposure to covid-19: Secondary | ICD-10-CM | POA: Diagnosis present

## 2020-08-14 DIAGNOSIS — D539 Nutritional anemia, unspecified: Secondary | ICD-10-CM | POA: Diagnosis present

## 2020-08-14 DIAGNOSIS — Z7984 Long term (current) use of oral hypoglycemic drugs: Secondary | ICD-10-CM

## 2020-08-14 DIAGNOSIS — D696 Thrombocytopenia, unspecified: Secondary | ICD-10-CM | POA: Diagnosis present

## 2020-08-14 DIAGNOSIS — Z880 Allergy status to penicillin: Secondary | ICD-10-CM

## 2020-08-14 DIAGNOSIS — N179 Acute kidney failure, unspecified: Secondary | ICD-10-CM | POA: Diagnosis present

## 2020-08-14 DIAGNOSIS — E669 Obesity, unspecified: Secondary | ICD-10-CM | POA: Diagnosis present

## 2020-08-14 DIAGNOSIS — J918 Pleural effusion in other conditions classified elsewhere: Secondary | ICD-10-CM | POA: Diagnosis present

## 2020-08-14 DIAGNOSIS — T451X5A Adverse effect of antineoplastic and immunosuppressive drugs, initial encounter: Secondary | ICD-10-CM | POA: Diagnosis present

## 2020-08-14 DIAGNOSIS — Z8249 Family history of ischemic heart disease and other diseases of the circulatory system: Secondary | ICD-10-CM

## 2020-08-14 DIAGNOSIS — J690 Pneumonitis due to inhalation of food and vomit: Secondary | ICD-10-CM

## 2020-08-14 DIAGNOSIS — Z79899 Other long term (current) drug therapy: Secondary | ICD-10-CM

## 2020-08-14 DIAGNOSIS — E860 Dehydration: Secondary | ICD-10-CM | POA: Diagnosis present

## 2020-08-14 DIAGNOSIS — R222 Localized swelling, mass and lump, trunk: Secondary | ICD-10-CM

## 2020-08-14 DIAGNOSIS — Z9221 Personal history of antineoplastic chemotherapy: Secondary | ICD-10-CM

## 2020-08-14 DIAGNOSIS — C01 Malignant neoplasm of base of tongue: Secondary | ICD-10-CM | POA: Diagnosis present

## 2020-08-14 DIAGNOSIS — I10 Essential (primary) hypertension: Secondary | ICD-10-CM | POA: Diagnosis present

## 2020-08-14 MED ORDER — MORPHINE SULFATE (PF) 4 MG/ML IV SOLN
4.0000 mg | Freq: Once | INTRAVENOUS | Status: AC
  Administered 2020-08-15: 4 mg via INTRAVENOUS
  Filled 2020-08-14: qty 1

## 2020-08-14 NOTE — ED Triage Notes (Signed)
Pt from home via [facility]. Pt c/o sharp shooting pain under left rib radiating to back that started yesterday afternoon and increased in severity. Pt denies nausea and vomiting. No bowel movement x3 days. Pt reports gas and belching. Unable to eat all day.

## 2020-08-15 ENCOUNTER — Emergency Department: Payer: 59

## 2020-08-15 ENCOUNTER — Other Ambulatory Visit: Payer: Self-pay

## 2020-08-15 ENCOUNTER — Inpatient Hospital Stay: Payer: 59

## 2020-08-15 ENCOUNTER — Other Ambulatory Visit: Payer: 59

## 2020-08-15 DIAGNOSIS — D6481 Anemia due to antineoplastic chemotherapy: Secondary | ICD-10-CM | POA: Diagnosis not present

## 2020-08-15 DIAGNOSIS — D539 Nutritional anemia, unspecified: Secondary | ICD-10-CM | POA: Diagnosis present

## 2020-08-15 DIAGNOSIS — I2699 Other pulmonary embolism without acute cor pulmonale: Secondary | ICD-10-CM | POA: Diagnosis present

## 2020-08-15 DIAGNOSIS — I10 Essential (primary) hypertension: Secondary | ICD-10-CM

## 2020-08-15 DIAGNOSIS — Z8249 Family history of ischemic heart disease and other diseases of the circulatory system: Secondary | ICD-10-CM | POA: Diagnosis not present

## 2020-08-15 DIAGNOSIS — E119 Type 2 diabetes mellitus without complications: Secondary | ICD-10-CM | POA: Diagnosis present

## 2020-08-15 DIAGNOSIS — Z79899 Other long term (current) drug therapy: Secondary | ICD-10-CM | POA: Diagnosis not present

## 2020-08-15 DIAGNOSIS — J690 Pneumonitis due to inhalation of food and vomit: Secondary | ICD-10-CM

## 2020-08-15 DIAGNOSIS — Z880 Allergy status to penicillin: Secondary | ICD-10-CM | POA: Diagnosis not present

## 2020-08-15 DIAGNOSIS — D696 Thrombocytopenia, unspecified: Secondary | ICD-10-CM | POA: Diagnosis present

## 2020-08-15 DIAGNOSIS — Z79891 Long term (current) use of opiate analgesic: Secondary | ICD-10-CM | POA: Diagnosis not present

## 2020-08-15 DIAGNOSIS — Z6838 Body mass index (BMI) 38.0-38.9, adult: Secondary | ICD-10-CM | POA: Diagnosis not present

## 2020-08-15 DIAGNOSIS — E785 Hyperlipidemia, unspecified: Secondary | ICD-10-CM | POA: Diagnosis present

## 2020-08-15 DIAGNOSIS — Z9221 Personal history of antineoplastic chemotherapy: Secondary | ICD-10-CM | POA: Diagnosis not present

## 2020-08-15 DIAGNOSIS — J90 Pleural effusion, not elsewhere classified: Secondary | ICD-10-CM

## 2020-08-15 DIAGNOSIS — J189 Pneumonia, unspecified organism: Secondary | ICD-10-CM

## 2020-08-15 DIAGNOSIS — K219 Gastro-esophageal reflux disease without esophagitis: Secondary | ICD-10-CM | POA: Diagnosis present

## 2020-08-15 DIAGNOSIS — R590 Localized enlarged lymph nodes: Secondary | ICD-10-CM

## 2020-08-15 DIAGNOSIS — Z20822 Contact with and (suspected) exposure to covid-19: Secondary | ICD-10-CM | POA: Diagnosis present

## 2020-08-15 DIAGNOSIS — E860 Dehydration: Secondary | ICD-10-CM | POA: Diagnosis present

## 2020-08-15 DIAGNOSIS — Z7984 Long term (current) use of oral hypoglycemic drugs: Secondary | ICD-10-CM | POA: Diagnosis not present

## 2020-08-15 DIAGNOSIS — E669 Obesity, unspecified: Secondary | ICD-10-CM | POA: Diagnosis present

## 2020-08-15 DIAGNOSIS — C01 Malignant neoplasm of base of tongue: Secondary | ICD-10-CM | POA: Diagnosis present

## 2020-08-15 DIAGNOSIS — D649 Anemia, unspecified: Secondary | ICD-10-CM

## 2020-08-15 DIAGNOSIS — Z923 Personal history of irradiation: Secondary | ICD-10-CM | POA: Diagnosis not present

## 2020-08-15 DIAGNOSIS — T451X5A Adverse effect of antineoplastic and immunosuppressive drugs, initial encounter: Secondary | ICD-10-CM | POA: Diagnosis present

## 2020-08-15 DIAGNOSIS — Z87891 Personal history of nicotine dependence: Secondary | ICD-10-CM

## 2020-08-15 DIAGNOSIS — J918 Pleural effusion in other conditions classified elsewhere: Secondary | ICD-10-CM | POA: Diagnosis present

## 2020-08-15 DIAGNOSIS — I2609 Other pulmonary embolism with acute cor pulmonale: Secondary | ICD-10-CM | POA: Diagnosis not present

## 2020-08-15 DIAGNOSIS — I2693 Single subsegmental pulmonary embolism without acute cor pulmonale: Secondary | ICD-10-CM | POA: Diagnosis present

## 2020-08-15 DIAGNOSIS — N179 Acute kidney failure, unspecified: Secondary | ICD-10-CM | POA: Diagnosis present

## 2020-08-15 DIAGNOSIS — D518 Other vitamin B12 deficiency anemias: Secondary | ICD-10-CM | POA: Diagnosis not present

## 2020-08-15 LAB — CBC
HCT: 26 % — ABNORMAL LOW (ref 39.0–52.0)
Hemoglobin: 8.7 g/dL — ABNORMAL LOW (ref 13.0–17.0)
MCH: 33.9 pg (ref 26.0–34.0)
MCHC: 33.5 g/dL (ref 30.0–36.0)
MCV: 101.2 fL — ABNORMAL HIGH (ref 80.0–100.0)
Platelets: 141 10*3/uL — ABNORMAL LOW (ref 150–400)
RBC: 2.57 MIL/uL — ABNORMAL LOW (ref 4.22–5.81)
RDW: 18.8 % — ABNORMAL HIGH (ref 11.5–15.5)
WBC: 4.5 10*3/uL (ref 4.0–10.5)
nRBC: 0 % (ref 0.0–0.2)

## 2020-08-15 LAB — CBC WITH DIFFERENTIAL/PLATELET
Abs Immature Granulocytes: 0.05 10*3/uL (ref 0.00–0.07)
Basophils Absolute: 0 10*3/uL (ref 0.0–0.1)
Basophils Relative: 0 %
Eosinophils Absolute: 0.1 10*3/uL (ref 0.0–0.5)
Eosinophils Relative: 1 %
HCT: 30.1 % — ABNORMAL LOW (ref 39.0–52.0)
Hemoglobin: 10 g/dL — ABNORMAL LOW (ref 13.0–17.0)
Immature Granulocytes: 1 %
Lymphocytes Relative: 8 %
Lymphs Abs: 0.4 10*3/uL — ABNORMAL LOW (ref 0.7–4.0)
MCH: 33.6 pg (ref 26.0–34.0)
MCHC: 33.2 g/dL (ref 30.0–36.0)
MCV: 101 fL — ABNORMAL HIGH (ref 80.0–100.0)
Monocytes Absolute: 0.6 10*3/uL (ref 0.1–1.0)
Monocytes Relative: 13 %
Neutro Abs: 3.7 10*3/uL (ref 1.7–7.7)
Neutrophils Relative %: 77 %
Platelets: 164 10*3/uL (ref 150–400)
RBC: 2.98 MIL/uL — ABNORMAL LOW (ref 4.22–5.81)
RDW: 18.9 % — ABNORMAL HIGH (ref 11.5–15.5)
WBC: 4.8 10*3/uL (ref 4.0–10.5)
nRBC: 0 % (ref 0.0–0.2)

## 2020-08-15 LAB — APTT: aPTT: 36 seconds (ref 24–36)

## 2020-08-15 LAB — BASIC METABOLIC PANEL
Anion gap: 6 (ref 5–15)
BUN: 19 mg/dL (ref 6–20)
CO2: 28 mmol/L (ref 22–32)
Calcium: 8.7 mg/dL — ABNORMAL LOW (ref 8.9–10.3)
Chloride: 100 mmol/L (ref 98–111)
Creatinine, Ser: 1.22 mg/dL (ref 0.61–1.24)
GFR, Estimated: >60 mL/min (ref >60)
Glucose, Bld: 137 mg/dL — ABNORMAL HIGH (ref 70–99)
Potassium: 4.2 mmol/L (ref 3.5–5.1)
Sodium: 134 mmol/L — ABNORMAL LOW (ref 135–145)

## 2020-08-15 LAB — URINALYSIS, COMPLETE (UACMP) WITH MICROSCOPIC
Bacteria, UA: NONE SEEN
Bilirubin Urine: NEGATIVE
Glucose, UA: NEGATIVE mg/dL
Hgb urine dipstick: NEGATIVE
Ketones, ur: NEGATIVE mg/dL
Leukocytes,Ua: NEGATIVE
Nitrite: NEGATIVE
Protein, ur: NEGATIVE mg/dL
Specific Gravity, Urine: >1.046 — ABNORMAL HIGH (ref 1.005–1.030)
pH: 5 (ref 5.0–8.0)

## 2020-08-15 LAB — COMPREHENSIVE METABOLIC PANEL
ALT: 9 U/L (ref 0–44)
AST: 15 U/L (ref 15–41)
Albumin: 3.6 g/dL (ref 3.5–5.0)
Alkaline Phosphatase: 98 U/L (ref 38–126)
Anion gap: 8 (ref 5–15)
BUN: 18 mg/dL (ref 6–20)
CO2: 26 mmol/L (ref 22–32)
Calcium: 9.1 mg/dL (ref 8.9–10.3)
Chloride: 100 mmol/L (ref 98–111)
Creatinine, Ser: 1.27 mg/dL — ABNORMAL HIGH (ref 0.61–1.24)
GFR, Estimated: >60 mL/min (ref >60)
Glucose, Bld: 139 mg/dL — ABNORMAL HIGH (ref 70–99)
Potassium: 3.9 mmol/L (ref 3.5–5.1)
Sodium: 134 mmol/L — ABNORMAL LOW (ref 135–145)
Total Bilirubin: 1.1 mg/dL (ref 0.3–1.2)
Total Protein: 7.2 g/dL (ref 6.5–8.1)

## 2020-08-15 LAB — LIPASE, BLOOD: Lipase: 49 U/L (ref 11–51)

## 2020-08-15 LAB — GLUCOSE, CAPILLARY
Glucose-Capillary: 119 mg/dL — ABNORMAL HIGH (ref 70–99)
Glucose-Capillary: 157 mg/dL — ABNORMAL HIGH (ref 70–99)
Glucose-Capillary: 113 mg/dL — ABNORMAL HIGH (ref 70–99)

## 2020-08-15 LAB — PROTIME-INR
INR: 1.1 (ref 0.8–1.2)
Prothrombin Time: 13.8 seconds (ref 11.4–15.2)

## 2020-08-15 LAB — HEPARIN LEVEL (UNFRACTIONATED)
Heparin Unfractionated: 0.39 IU/mL (ref 0.30–0.70)
Heparin Unfractionated: 0.34 IU/mL (ref 0.30–0.70)

## 2020-08-15 LAB — BRAIN NATRIURETIC PEPTIDE: B Natriuretic Peptide: 28 pg/mL (ref 0.0–100.0)

## 2020-08-15 LAB — TROPONIN I (HIGH SENSITIVITY)
Troponin I (High Sensitivity): 8 ng/L (ref <18)
Troponin I (High Sensitivity): 9 ng/L (ref <18)

## 2020-08-15 LAB — PROCALCITONIN: Procalcitonin: 0.1 ng/mL

## 2020-08-15 LAB — STREP PNEUMONIAE URINARY ANTIGEN: Strep Pneumo Urinary Antigen: NEGATIVE

## 2020-08-15 LAB — SARS CORONAVIRUS 2 (TAT 6-24 HRS): SARS Coronavirus 2: NEGATIVE

## 2020-08-15 LAB — LACTIC ACID, PLASMA: Lactic Acid, Venous: 1.2 mmol/L (ref 0.5–1.9)

## 2020-08-15 LAB — HIV ANTIBODY (ROUTINE TESTING W REFLEX): HIV Screen 4th Generation wRfx: NONREACTIVE

## 2020-08-15 MED ORDER — TRAZODONE HCL 50 MG PO TABS: 25.0000 mg | ORAL_TABLET | Freq: Every evening | ORAL | Status: DC | PRN

## 2020-08-15 MED ORDER — ACETAMINOPHEN 650 MG RE SUPP: 650.0000 mg | Freq: Four times a day (QID) | RECTAL | Status: DC | PRN

## 2020-08-15 MED ORDER — MAGNESIUM HYDROXIDE 400 MG/5ML PO SUSP: 30.0000 mL | Freq: Every day | ORAL | Status: DC | PRN

## 2020-08-15 MED ORDER — MORPHINE SULFATE (PF) 2 MG/ML IV SOLN
2.0000 mg | INTRAVENOUS | Status: DC | PRN
  Administered 2020-08-15 – 2020-08-17 (×7): 2 mg via INTRAVENOUS
  Filled 2020-08-15 (×7): qty 1

## 2020-08-15 MED ORDER — DM-GUAIFENESIN ER 30-600 MG PO TB12: 1.0000 | ORAL_TABLET | Freq: Two times a day (BID) | ORAL | Status: DC | PRN

## 2020-08-15 MED ORDER — IOHEXOL 350 MG/ML SOLN
100.0000 mL | Freq: Once | INTRAVENOUS | Status: AC | PRN
  Administered 2020-08-15: 100 mL via INTRAVENOUS

## 2020-08-15 MED ORDER — GUAIFENESIN ER 600 MG PO TB12
600.0000 mg | ORAL_TABLET | Freq: Two times a day (BID) | ORAL | Status: DC
  Administered 2020-08-16: 600 mg via ORAL
  Filled 2020-08-15 (×2): qty 1

## 2020-08-15 MED ORDER — ACETAMINOPHEN 325 MG PO TABS
650.0000 mg | ORAL_TABLET | Freq: Four times a day (QID) | ORAL | Status: DC | PRN
  Administered 2020-08-15: 650 mg via ORAL
  Filled 2020-08-15: qty 2

## 2020-08-15 MED ORDER — MORPHINE SULFATE (PF) 2 MG/ML IV SOLN
2.0000 mg | Freq: Once | INTRAVENOUS | Status: AC
  Administered 2020-08-15: 2 mg via INTRAVENOUS
  Filled 2020-08-15: qty 1

## 2020-08-15 MED ORDER — HEPARIN (PORCINE) 25000 UT/250ML-% IV SOLN
1500.0000 [IU]/h | INTRAVENOUS | Status: DC
  Administered 2020-08-15 (×2): 1500 [IU]/h via INTRAVENOUS
  Filled 2020-08-15 (×3): qty 250

## 2020-08-15 MED ORDER — GUAIFENESIN-DM 100-10 MG/5ML PO SYRP
5.0000 mL | ORAL_SOLUTION | ORAL | Status: DC | PRN
  Administered 2020-08-15 – 2020-08-16 (×2): 5 mL via ORAL
  Filled 2020-08-15 (×2): qty 5

## 2020-08-15 MED ORDER — ONDANSETRON HCL 4 MG PO TABS: 4.0000 mg | ORAL_TABLET | Freq: Four times a day (QID) | ORAL | Status: DC | PRN

## 2020-08-15 MED ORDER — MORPHINE SULFATE (PF) 2 MG/ML IV SOLN
2.0000 mg | Freq: Once | INTRAVENOUS | Status: AC
Start: 2020-08-15 — End: 2020-08-15
  Administered 2020-08-15: 2 mg via INTRAVENOUS
  Filled 2020-08-15: qty 1

## 2020-08-15 MED ORDER — SODIUM CHLORIDE 0.9 % IV SOLN: INTRAVENOUS | Status: DC

## 2020-08-15 MED ORDER — SODIUM CHLORIDE 0.9 % IV SOLN
500.0000 mg | INTRAVENOUS | Status: DC
  Administered 2020-08-15 – 2020-08-17 (×3): 500 mg via INTRAVENOUS
  Filled 2020-08-15 (×4): qty 500

## 2020-08-15 MED ORDER — SODIUM CHLORIDE 0.9 % IV SOLN
2.0000 g | INTRAVENOUS | Status: DC
  Administered 2020-08-15 – 2020-08-17 (×3): 2 g via INTRAVENOUS
  Filled 2020-08-15 (×2): qty 20
  Filled 2020-08-15: qty 2

## 2020-08-15 MED ORDER — ONDANSETRON HCL 4 MG/2ML IJ SOLN: 4.0000 mg | Freq: Four times a day (QID) | INTRAMUSCULAR | Status: DC | PRN

## 2020-08-15 MED ORDER — MORPHINE SULFATE (PF) 4 MG/ML IV SOLN
4.0000 mg | Freq: Once | INTRAVENOUS | Status: AC
  Administered 2020-08-15: 4 mg via INTRAVENOUS
  Filled 2020-08-15: qty 1

## 2020-08-15 MED ORDER — HEPARIN BOLUS VIA INFUSION
6000.0000 [IU] | Freq: Once | INTRAVENOUS | Status: AC
  Administered 2020-08-15: 6000 [IU] via INTRAVENOUS
  Filled 2020-08-15: qty 6000

## 2020-08-15 MED ORDER — KETOROLAC TROMETHAMINE 15 MG/ML IJ SOLN
15.0000 mg | Freq: Four times a day (QID) | INTRAMUSCULAR | Status: DC | PRN
  Administered 2020-08-15: 15 mg via INTRAVENOUS
  Filled 2020-08-15 (×2): qty 1

## 2020-08-15 MED ORDER — OXYCODONE HCL 5 MG PO TABS: 5.0000 mg | ORAL_TABLET | ORAL | Status: DC | PRN

## 2020-08-15 NOTE — H&P (Addendum)
[place]   PATIENT NAME: Joshua Bray    MR#:  [id number]  DATE OF BIRTH:  1960/06/27  DATE OF ADMISSION:  08/14/2020  PRIMARY CARE PHYSICIAN: Verl Bangs, FNP   Patient is coming from: Home  REQUESTING/REFERRING PHYSICIAN: Delman Kitten, MD  CHIEF COMPLAINT:   Chief Complaint  Patient presents with  . Abdominal Pain    HISTORY OF PRESENT ILLNESS:  Joshua Bray is a 60 y.o. 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 Rogue Bussing, presented to the emergency room with acute onset of left lower rib and upper quadrant abdominal pain with radiation to his back as well as exacerbation by deep breathing.  He feels stabbing pain with deep breathing and his radiation to the middle of his left chest and his left shoulder.  He has been having occasional cough productive of clear and whitish sputum and mild dyspnea when he takes a deep breath due to pain.  He thinks he may have aspirated a couple of times with dry heaving.  No fever or chills.  No rhinorrhea or nasal congestion or sore throat.  No dysuria, oliguria or hematuria or flank pain. ED Course: When he came to the ER heart rate was 115 with a blood pressure 129/94 and otherwise normal vital signs.  Labs revealed sodium 134 and creatinine 1.27 with otherwise unremarkable CMP.  High-sensitivity troponin I was 9 and later 8 and BNP 28.  CBC showed mild anemia similar to previous levels.  And lactic acid was 1.2 EKG as reviewed by me : Showed sinus rhythm with rate of 96. Imaging: Chest x-ray showed small left pleural effusion with basal airspace disease, atelectasis or pneumonia. Chest CTA revealed: 1. Positive for acute bilateral pulmonary emboli as described above. No evidence for right heart strain. 2. Partial consolidation and ground-glass  density in the left lower lobe favored to represent pneumonia or aspiration over infarct. Trace left-sided pleural effusion. 3. Aortic atherosclerosis Abdominal pelvic CT revealed 1. No CT evidence for acute intra-abdominal or pelvic abnormality. 2. Small left pleural effusion. Airspace disease in the left lung base which may reflect atelectasis, pneumonia or aspiration. 3. 10.8 x 4.2 x 14 cm fat density mass superficial to the right paraspinal muscles with small internal vessels and small calcifications within. The presence of enhancing of internal enhancing vessels is somewhat unusual; suggest lumbar MRI for further characterization of the mass. This may be performed on a nonemergent basis. 4.  Aortic Atherosclerosis.  The patient was given IV heparin bolus and drip as well as 2 mg then 4 mg of IV morphine sulfate.  He will be admitted to a progressive unit bed for further evaluation and management. PAST MEDICAL HISTORY:   Past Medical History:  Diagnosis Date  . Diabetes mellitus without complication ([place])   . Hypertension   . Mucositis     PAST SURGICAL HISTORY:   Past Surgical History:  Procedure Laterality Date  . FEMUR SURGERY Left 1983  . IR IMAGING GUIDED PORT INSERTION  04/16/2020    SOCIAL HISTORY:   Social History   Tobacco Use  . Smoking status: Former Smoker    Years: 4.00    Quit date: 03/10/1990    Years since quitting: 30.4  . Smokeless tobacco: Never Used  Substance Use Topics  . Alcohol use: Yes  Alcohol/week: 5.0 standard drinks    Types: 5 Cans of beer per week    FAMILY HISTORY:   Family History  Problem Relation Age of Onset  . Thyroid disease Mother   . Leukemia Father   . Heart disease Father   . Diabetes Father     DRUG ALLERGIES:   Allergies  Allergen Reactions  . Penicillins     Unknown, allergy from an infant    REVIEW OF SYSTEMS:   ROS As per history of present illness. All pertinent systems were reviewed above.  Constitutional, HEENT, cardiovascular, respiratory, GI, GU, musculoskeletal, neuro, psychiatric, endocrine, integumentary and hematologic systems were reviewed and are otherwise negative/unremarkable except for positive findings mentioned above in the HPI.   MEDICATIONS AT HOME:   Prior to Admission medications   Medication Sig Start Date End Date Taking? Authorizing Provider  acetaminophen (TYLENOL) 500 MG tablet Take 1,000 mg by mouth 2 (two) times daily. Patient not taking: Reported on 08/06/2020    [provider]  amLODipine (NORVASC) 10 MG tablet Take 1 tablet (10 mg total) by mouth daily. Patient not taking: No sig reported 03/10/20   Verl Bangs, FNP  ezetimibe (ZETIA) 10 MG tablet Take 1 tablet (10 mg total) by mouth daily. Patient not taking: No sig reported 03/11/20   Malfi, Lupita Raider, FNP  metFORMIN (GLUCOPHAGE) 500 MG tablet Take 1 tablet (500 mg total) by mouth 2 (two) times daily with a meal. Patient not taking: No sig reported 03/10/20   Malfi, Lupita Raider, FNP  Morphine Sulfate (MORPHINE CONCENTRATE) 10 mg / 0.5 ml concentrated solution Take 0.25 mLs (5 mg total) by mouth every 6 (six) hours as needed for anxiety (difficulty sleeping). Patient not taking: No sig reported 05/19/20   Cammie Sickle, MD  mupirocin ointment (BACTROBAN) 2 % Apply 1 application topically 2 (two) times daily. Patient not taking: No sig reported 03/10/20   Malfi, Lupita Raider, FNP  nystatin (MYCOSTATIN/NYSTOP) powder Apply 1 application topically 3 (three) times daily. Patient not taking: No sig reported 05/12/20   Jacquelin Hawking, NP  ondansetron (ZOFRAN) 8 MG tablet Take 1 tablet (8 mg total) by mouth every 8 (eight) hours as needed for nausea or vomiting. Patient not taking: Reported on 08/06/2020 07/19/20   Cammie Sickle, MD  prochlorperazine (COMPAZINE) 10 MG tablet Take 1 tablet (10 mg total) by mouth every 6 (six) hours as needed for nausea or vomiting. Patient not taking: No sig  reported 04/26/20   Cammie Sickle, MD  rosuvastatin (CRESTOR) 20 MG tablet Take 1 tablet (20 mg total) by mouth daily. Patient not taking: No sig reported 03/11/20   Malfi, Lupita Raider, FNP  sucralfate (CARAFATE) 1 g tablet Take 1 tablet (1 g total) by mouth 3 (three) times daily. Dissolve in 3-4 tbsp warm water, swish and swallow. Patient not taking: No sig reported 05/11/20   Noreene Filbert, MD      VITAL SIGNS:  Blood pressure (!) 155/88, pulse 85, temperature 98.1 F (36.7 C), temperature source Oral, resp. rate 17, height 5\' 10"  (1.778 m), weight 122.5 kg, SpO2 99 %.  PHYSICAL EXAMINATION:  Physical Exam  GENERAL:  60 y.o.-year-old Caucasian male patient lying in the bed with no acute distress.  EYES: Pupils equal, round, reactive to light and accommodation. No scleral icterus. Extraocular muscles intact.  HEENT: Head atraumatic, normocephalic. Oropharynx and nasopharynx clear.  NECK:  Supple, no jugular venous distention. No thyroid enlargement, no tenderness.  LUNGS: Diminished  bibasal breath sounds with left basal crackles no use of accessory muscles of respiration.  CARDIOVASCULAR: Regular rate and rhythm, S1, S2 normal. No murmurs, rubs, or gallops.  ABDOMEN: Soft, nondistended, nontender. Bowel sounds present. No organomegaly or mass.  EXTREMITIES: No pedal edema, cyanosis, or clubbing.  NEUROLOGIC: Cranial nerves II through XII are intact. Muscle strength 5/5 in all extremities. Sensation intact. Gait not checked.  PSYCHIATRIC: The patient is alert and oriented x 3.  Normal affect and good eye contact. SKIN: No obvious rash, lesion, or ulcer.   LABORATORY PANEL:   CBC Recent Labs  Lab 08/15/20 0006  WBC 4.8  HGB 10.0*  HCT 30.1*  PLT 164   ------------------------------------------------------------------------------------------------------------------  Chemistries  Recent Labs  Lab 08/11/20 1302 08/15/20 0006  NA 136 134*  K 4.1 3.9  CL 101 100  CO2 25  26  GLUCOSE 122* 139*  BUN 18 18  CREATININE 1.48* 1.27*  CALCIUM 8.8* 9.1  MG 1.8  --   AST 22 15  ALT 10 9  ALKPHOS 83 98  BILITOT 0.7 1.1   ------------------------------------------------------------------------------------------------------------------  Cardiac Enzymes No results for input(s): TROPONINI in the last 168 hours. ------------------------------------------------------------------------------------------------------------------  RADIOLOGY:  CT Angio Chest PE W and/or Wo Contrast  Result Date: 08/15/2020 CLINICAL DATA:  Chest pain short of breath EXAM: CT ANGIOGRAPHY CHEST WITH CONTRAST TECHNIQUE: Multidetector CT imaging of the chest was performed using the standard protocol during bolus administration of intravenous contrast. Multiplanar CT image reconstructions and MIPs were obtained to evaluate the vascular anatomy. CONTRAST:  133mL OMNIPAQUE IOHEXOL 350 MG/ML SOLN COMPARISON:  PET CT 04/21/2020 FINDINGS: Cardiovascular: Satisfactory opacification of the pulmonary arteries to the segmental level. Acute filling defect within right middle lobe pulmonary artery with extension into segmental and subsegmental branches. Small acute emboli within right lower lobe segmental and subsegmental vessels. Acute left lower lobe subsegmental pulmonary embolus. No evidence for right heart strain. Nonaneurysmal aorta without dissection. Mild aortic atherosclerosis. Coronary vascular calcification. Borderline to mild cardiomegaly. No pericardial effusion Mediastinum/Nodes: Midline trachea. No thyroid mass. Esophagus within normal limits. No suspicious adenopathy Lungs/Pleura: Trace left-sided pleural effusion. Partial consolidation and ground-glass density in the left lower lobe favored to represent pneumonia over infarct. Mild atelectasis right base. Upper Abdomen: No acute abnormality. Musculoskeletal: No chest wall abnormality. No acute or significant osseous findings. Review of the MIP images  confirms the above findings. IMPRESSION: 1. Positive for acute bilateral pulmonary emboli as described above. No evidence for right heart strain. 2. Partial consolidation and ground-glass density in the left lower lobe favored to represent pneumonia or aspiration over infarct. Trace left-sided pleural effusion. 3. Aortic atherosclerosis. Critical Value/emergent results were called by telephone at the time of interpretation on 08/15/2020 at 2:08 am to provider MARK QUALE , who verbally acknowledged these results. Aortic Atherosclerosis (ICD10-I70.0). Electronically Signed   By: Donavan Foil M.D.   On: 08/15/2020 02:08   CT ABDOMEN PELVIS W CONTRAST  Result Date: 08/15/2020 CLINICAL DATA:  Left upper quadrant pain EXAM: CT ABDOMEN AND PELVIS WITH CONTRAST TECHNIQUE: Multidetector CT imaging of the abdomen and pelvis was performed using the standard protocol following bolus administration of intravenous contrast. CONTRAST:  128mL OMNIPAQUE IOHEXOL 350 MG/ML SOLN COMPARISON:  None FINDINGS: Lower chest: Lung bases demonstrate small left pleural effusion. Airspace disease in the left lung base which may reflect atelectasis, pneumonia or aspiration. Mild atelectasis right base Hepatobiliary: No focal liver abnormality is seen. No gallstones, gallbladder wall thickening, or biliary dilatation. Pancreas: Unremarkable. No  pancreatic ductal dilatation or surrounding inflammatory changes. Spleen: Normal in size without focal abnormality. Adrenals/Urinary Tract: Adrenal glands are unremarkable. Kidneys are normal, without renal calculi, focal lesion, or hydronephrosis. Bladder is unremarkable. Stomach/Bowel: Stomach is within normal limits. Appendix appears normal. No evidence of bowel wall thickening, distention, or inflammatory changes. Vascular/Lymphatic: Mild aortic atherosclerosis. No aneurysm. No suspicious nodes Reproductive: Prostate is unremarkable. Other: Negative for free air or free fluid. Fat within the  bilateral inguinal canals. Musculoskeletal: Hardware with artifact in the left femur. Fat density mass superficial to the right paraspinal muscles with small internal vessels. There are small calcifications within. The mass measures approximately 10.8 by 4.2 by 14 cm. IMPRESSION: 1. No CT evidence for acute intra-abdominal or pelvic abnormality. 2. Small left pleural effusion. Airspace disease in the left lung base which may reflect atelectasis, pneumonia or aspiration. 3. 10.8 x 4.2 x 14 cm fat density mass superficial to the right paraspinal muscles with small internal vessels and small calcifications within. The presence of enhancing of internal enhancing vessels is somewhat unusual; suggest lumbar MRI for further characterization of the mass. This may be performed on a nonemergent basis. Aortic Atherosclerosis (ICD10-I70.0). Electronically Signed   By: Donavan Foil M.D.   On: 08/15/2020 02:12   DG Chest Portable 1 View  Result Date: 08/15/2020 CLINICAL DATA:  Pleuritic chest pain EXAM: PORTABLE CHEST 1 VIEW COMPARISON:  PET CT 04/21/2020 FINDINGS: Right-sided central venous port tip over the SVC. Small left-sided pleural effusion with basilar airspace disease. Borderline cardiomegaly. No pneumothorax IMPRESSION: Small left pleural effusion with basilar airspace disease, atelectasis or pneumonia. Electronically Signed   By: Donavan Foil M.D.   On: 08/15/2020 00:12      IMPRESSION AND PLAN:  Active Problems:   Acute pulmonary embolism ([place])  1.  Bilateral acute pulmonary embolism with left-sided likely pleuritic chest pain. -The patient will be admitted to a progressive unit bed. -We will continue him on IV heparin per protocol. -He will be placed on as needed IV Toradol for pain. -We will obtain a 2D echo to assess for right ventricular strain. -We will obtain bilateral lower extremity venous Doppler to rule out DVT.  2.  Left lower lobe likely aspiration pneumonia with parapneumonic  effusion. -The patient will be placed on IV Rocephin and Zithromax. -Mucolytic therapy will be provided. -We will obtain blood and sputum cultures.  3.  Essential hypertension. -We will continue Norvasc.  4.  Type 2 diabetes mellitus without complication. -We will place the patient on supplement coverage with NovoLog and hold off Metformin given recently given IV contrast.  5.  Dyslipidemia. -We will continue Zetia and statin therapy.  6.  Right paraspinal mass. -The patient has no pain or complaints there.  This can be further evaluated on an outpatient basis with lumbar MRI.  7.  Oral tongue cancer status post chemotherapy and radiotherapy.  DVT prophylaxis: IV heparin. Code Status: full code. Family Communication:  The plan of care was discussed in details with the patient (who requested no other family members to be notified at this time). I answered all questions. The patient agreed to proceed with the above mentioned plan. Further management will depend upon hospital course. Disposition Plan: Back to previous home environment Consults called: none. All the records are reviewed and case discussed with ED provider.  Status is: Inpatient  Remains inpatient appropriate because:Ongoing active pain requiring inpatient pain management, Ongoing diagnostic testing needed not appropriate for outpatient work up, Unsafe d/c plan, IV  treatments appropriate due to intensity of illness or inability to take PO and Inpatient level of care appropriate due to severity of illness   Dispo: The patient is from: Home              Anticipated d/c is to: Home              Patient currently is not medically stable to d/c.   Difficult to place patient No   TOTAL TIME TAKING CARE OF THIS PATIENT: 55 minutes.    Christel Mormon M.D on 08/15/2020 at 2:35 AM  [facility]   From 7 PM-7 AM, contact night-coverage [email or web address]  CC: Primary care physician; Malfi, Lupita Raider, FNP

## 2020-08-15 NOTE — Progress Notes (Signed)
[facility] for heparin infusion Indication: pulmonary embolus  Allergies  Allergen Reactions  . Penicillins     Unknown, allergy from an infant    Patient Measurements: Height: 5\' 10"  (177.8 cm) Weight: 111.7 kg (246 lb 4.8 oz) IBW/kg (Calculated) : 73 Heparin Dosing Weight: 100.6 kg  Vital Signs: Temp: 98.2 F (36.8 C) (04/10 1140) Temp Source: Oral (04/10 1140) BP: 102/88 (04/10 1140) Pulse Rate: 97 (04/10 1140)  Labs: Recent Labs    08/15/20 0006 08/15/20 0140 08/15/20 0236 08/15/20 0434 08/15/20 0830 08/15/20 1453  HGB 10.0*  --   --  8.7*  --   --   HCT 30.1*  --   --  26.0*  --   --   PLT 164  --   --  141*  --   --   APTT  --   --  36  --   --   --   LABPROT  --   --  13.8  --   --   --   INR  --   --  1.1  --   --   --   HEPARINUNFRC  --   --   --   --  0.39 0.34  CREATININE 1.27*  --   --  1.22  --   --   TROPONINIHS 9 8  --   --   --   --     Estimated Creatinine Clearance: 82.6 mL/min (by C-G formula based on SCr of 1.22 mg/dL).   Medical History: Past Medical History:  Diagnosis Date  . Diabetes mellitus without complication ([place])   . Hypertension   . Mucositis     Assessment: Pt is 60 yo male presenting to ER due to worsening/radiating left sided chest pain.  CT Chest: "Positive for acute bilateral pulmonary emboli."  4/10 0830 HL 0.39  4/10 1453 HL 0.34, therapeutic x 2  Goal of Therapy:  Heparin level 0.3-0.7 units/ml Monitor platelets by anticoagulation protocol: Yes   Plan:  Heparin level is therapeutic. Will continue current rate at 1500 units/hr. Recheck heparin level with AM labs. CBC daily while on heparin.   Paulina Fusi, PharmD, BCPS 08/15/2020 4:03 PM

## 2020-08-15 NOTE — Progress Notes (Deleted)
Agree with prev. Nurse assessment.

## 2020-08-15 NOTE — Progress Notes (Signed)
Pt arrived to unit transported by RN. Pt ambulated from gurney to bed.   Heparin drip handed off.   Pt reports pain 7/10 with movement, inhalation and burps.  VSS taken, placed on telemetry.  Pt resting in bed with call bell and telephone next to him.

## 2020-08-15 NOTE — Progress Notes (Signed)
Patient ID: Joshua Bray, male   DOB: 06-30-1960, 60 y.o.   MRN: [id number] This is a no charge note as patient was admitted this AM.  Patient seen chart reviewed Joshua Bray is a 60 y.o. 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 Rogue Bussing, presented to the emergency room with acute onset of left lower rib and upper quadrant abdominal pain with radiation to his back as well as exacerbation by deep breathing.  Found with acute bilateral PE. Oncology was consulted.On heparin, consider switching to Eliquis or xarelto prior to dc. F/u oncology .

## 2020-08-15 NOTE — ED Provider Notes (Addendum)
[facility] Emergency Department Provider Note   ____________________________________________   Event Date/Time   First MD Initiated Contact with Patient 08/14/20 2307     (approximate)  I have reviewed the triage vital signs and the nursing notes.   HISTORY  Chief Complaint Abdominal Pain    HPI Joshua Bray is a 60 y.o. 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  In addition the patient has history of type 2 diabetes.  Patient reports that he has been experiencing shooting pain under his left lower rib and left upper abdomen that shoots towards his back.  Is been going on since yesterday but seems to getting worse.  It is much more noticeable when he takes a deep breath.  Denies feeling short of breath but he when he take a deep breath he has a very sharp pain feels like someone stabbing him in his left flank left upper abdomen versus very low left chest wall  Denies "chest pain" however.  Does report at times the pain will radiate slightly up towards his left middle chest though.  No nausea or vomiting.  No fevers or chills.  He does report feeling somewhat constipated with last bowel movement about 3 days ago.  Takes oral morphine at home, but has held that for several days as he reports that that causes him a constipated symptom  Started wearing compression stockings recently, noticed he was having some ankle swelling, nurse practitioner evaluated him and recommended these.  He is passing gas.  No urinary symptoms.  No fevers or chills.  Pain is mild at rest, but with a deep breath become severe   Past Medical History:  Diagnosis Date  . Diabetes mellitus without complication ([place])   . Hypertension   . Mucositis     Patient Active Problem List   Diagnosis Date Noted  . Acute pulmonary embolism ([place]) 08/15/2020  . Chronic venous insufficiency 08/06/2020  . Nausea with vomiting 07/06/2020  .  Hypomagnesemia 07/06/2020  . Hypokalemia 07/06/2020  . Counseling regarding advance care planning and goals of care 04/11/2020  . 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 Skin infection 03/11/2020  . Tooth infection 03/11/2020  . Neck swelling 03/11/2020  . Encounter to establish care with new doctor 03/10/2020  . Screening, lipid 03/10/2020  . Screening for malignant neoplasm of prostate 03/10/2020  . Weight gain 03/10/2020  . Type 2 diabetes mellitus with hyperglycemia, without long-term current use of insulin ([place]) 03/10/2020  . Essential hypertension 03/10/2020    Past Surgical History:  Procedure Laterality Date  . FEMUR SURGERY Left 1983  . IR IMAGING GUIDED PORT INSERTION  04/16/2020    Prior to Admission medications   Medication Sig Start Date End Date Taking? Authorizing Provider  acetaminophen (TYLENOL) 500 MG tablet Take 1,000 mg by mouth 2 (two) times daily.   Yes [provider]  amLODipine (NORVASC) 10 MG tablet Take 1 tablet (10 mg total) by mouth daily. Patient not taking: No sig reported 03/10/20   Verl Bangs, FNP  ezetimibe (ZETIA) 10 MG tablet Take 1 tablet (10 mg total) by mouth daily. Patient not taking: No sig reported 03/11/20   Malfi, Lupita Raider, FNP  metFORMIN (GLUCOPHAGE) 500 MG tablet Take 1 tablet (500 mg total) by mouth 2 (two) times daily with a meal. Patient not taking: No sig reported 03/10/20   Verl Bangs, FNP  Morphine Sulfate (MORPHINE CONCENTRATE) 10  mg / 0.5 ml concentrated solution Take 0.25 mLs (5 mg total) by mouth every 6 (six) hours as needed for anxiety (difficulty sleeping). Patient not taking: No sig reported 05/19/20   Cammie Sickle, MD  mupirocin ointment (BACTROBAN) 2 % Apply 1 application topically 2 (two) times daily. Patient not taking: No sig reported 03/10/20   Malfi, Lupita Raider, FNP  nystatin (MYCOSTATIN/NYSTOP) powder Apply 1 application topically 3 (three) times daily. Patient not taking: No  sig reported 05/12/20   Jacquelin Hawking, NP  ondansetron (ZOFRAN) 8 MG tablet Take 1 tablet (8 mg total) by mouth every 8 (eight) hours as needed for nausea or vomiting. Patient not taking: No sig reported 07/19/20   Cammie Sickle, MD  prochlorperazine (COMPAZINE) 10 MG tablet Take 1 tablet (10 mg total) by mouth every 6 (six) hours as needed for nausea or vomiting. Patient not taking: No sig reported 04/26/20   Cammie Sickle, MD  rosuvastatin (CRESTOR) 20 MG tablet Take 1 tablet (20 mg total) by mouth daily. Patient not taking: No sig reported 03/11/20   Malfi, Lupita Raider, FNP  sucralfate (CARAFATE) 1 g tablet Take 1 tablet (1 g total) by mouth 3 (three) times daily. Dissolve in 3-4 tbsp warm water, swish and swallow. Patient not taking: No sig reported 05/11/20   Noreene Filbert, MD    Allergies Penicillins  Family History  Problem Relation Age of Onset  . Thyroid disease Mother   . Leukemia Father   . Heart disease Father   . Diabetes Father     Social History Social History   Tobacco Use  . Smoking status: Former Smoker    Years: 4.00    Quit date: 03/10/1990    Years since quitting: 30.4  . Smokeless tobacco: Never Used  Vaping Use  . Vaping Use: Never used  Substance Use Topics  . Alcohol use: Yes    Alcohol/week: 5.0 standard drinks    Types: 5 Cans of beer per week  . Drug use: Not Currently    Review of Systems Constitutional: No fever/chills Eyes: No visual changes. ENT: No sore throat. Cardiovascular: See HPI Respiratory: Denies shortness of breath. Gastrointestinal: See HPI Genitourinary: Negative for dysuria. Musculoskeletal: Negative for back pain.  See HPI regarding ankle swelling Skin: Negative for rash. Neurological: Negative for headaches, areas of focal weakness or numbness.    ____________________________________________   PHYSICAL EXAM:  VITAL SIGNS: ED Triage Vitals  Enc Vitals Group     BP 08/14/20 2310 (!) 146/93      Pulse Rate 08/14/20 2310 (!) 117     Resp 08/14/20 2310 20     Temp 08/14/20 2310 98.1 F (36.7 C)     Temp Source 08/14/20 2310 Oral     SpO2 08/14/20 2310 96 %     Weight 08/14/20 2311 270 lb (122.5 kg)     Height 08/14/20 2311 5\' 10"  (1.778 m)     Head Circumference --      Peak Flow --      Pain Score 08/14/20 2311 9     Pain Loc --      Pain Edu? --      Excl. in [place]? --     Constitutional: Alert and oriented. Well appearing and in no acute distress.  However when he takes deep breath he has an obvious severe pain and catching which she reports in the left lower rib cage.  Seems very pleuritic in nature Eyes: Conjunctivae are  normal. Head: Atraumatic. Nose: No congestion/rhinnorhea. Mouth/Throat: Mucous membranes are moist. Neck: No stridor.  Cardiovascular: Normal rate, regular rhythm. Grossly normal heart sounds.  Good peripheral circulation. Respiratory: Normal respiratory effort.  No retractions. Lungs CTAB except somewhat diminished the left lower lung possibly due to splinting with deep respiration or decreased lung sound here. Gastrointestinal: Soft and nontender. No distention.  No reproducible tenderness to exam but does report the pain feels like since left upper abdomen Musculoskeletal: No lower extremity tenderness has mild bilateral lower extremity edema Neurologic:  Normal speech and language. No gross focal neurologic deficits are appreciated.  Skin:  Skin is warm, dry and intact. No rash noted. Psychiatric: Mood and affect are normal. Speech and behavior are normal.  ____________________________________________   LABS (all labs ordered are listed, but only abnormal results are displayed)  Labs Reviewed  COMPREHENSIVE METABOLIC PANEL - Abnormal; Notable for the following components:      Result Value   Sodium 134 (*)    Glucose, Bld 139 (*)    Creatinine, Ser 1.27 (*)    All other components within normal limits  URINALYSIS, COMPLETE (UACMP) WITH  MICROSCOPIC - Abnormal; Notable for the following components:   Color, Urine YELLOW (*)    APPearance CLEAR (*)    Specific Gravity, Urine >1.046 (*)    All other components within normal limits  CBC WITH DIFFERENTIAL/PLATELET - Abnormal; Notable for the following components:   RBC 2.98 (*)    Hemoglobin 10.0 (*)    HCT 30.1 (*)    MCV 101.0 (*)    RDW 18.9 (*)    Lymphs Abs 0.4 (*)    All other components within normal limits  BASIC METABOLIC PANEL - Abnormal; Notable for the following components:   Sodium 134 (*)    Glucose, Bld 137 (*)    Calcium 8.7 (*)    All other components within normal limits  CBC - Abnormal; Notable for the following components:   RBC 2.57 (*)    Hemoglobin 8.7 (*)    HCT 26.0 (*)    MCV 101.2 (*)    RDW 18.8 (*)    Platelets 141 (*)    All other components within normal limits  SARS CORONAVIRUS 2 (TAT 6-24 HRS)  EXPECTORATED SPUTUM ASSESSMENT W GRAM STAIN, RFLX TO RESP C  LIPASE, BLOOD  BRAIN NATRIURETIC PEPTIDE  LACTIC ACID, PLASMA  PROTIME-INR  APTT  PROCALCITONIN  HIV ANTIBODY (ROUTINE TESTING W REFLEX)  HEPARIN LEVEL (UNFRACTIONATED)  LEGIONELLA PNEUMOPHILA SEROGP 1 UR AG  STREP PNEUMONIAE URINARY ANTIGEN  TROPONIN I (HIGH SENSITIVITY)  TROPONIN I (HIGH SENSITIVITY)   ____________________________________________  EKG  Reviewed inter by me at 0022 Heart rate 95 QRS 90 QTc 420 Normal sinus rhythm no evidence of acute ischemia ____________________________________________  RADIOLOGY  CT Angio Chest PE W and/or Wo Contrast  Result Date: 08/15/2020 CLINICAL DATA:  Chest pain short of breath EXAM: CT ANGIOGRAPHY CHEST WITH CONTRAST TECHNIQUE: Multidetector CT imaging of the chest was performed using the standard protocol during bolus administration of intravenous contrast. Multiplanar CT image reconstructions and MIPs were obtained to evaluate the vascular anatomy. CONTRAST:  149mL OMNIPAQUE IOHEXOL 350 MG/ML SOLN COMPARISON:  PET CT  04/21/2020 FINDINGS: Cardiovascular: Satisfactory opacification of the pulmonary arteries to the segmental level. Acute filling defect within right middle lobe pulmonary artery with extension into segmental and subsegmental branches. Small acute emboli within right lower lobe segmental and subsegmental vessels. Acute left lower lobe subsegmental pulmonary embolus. No evidence  for right heart strain. Nonaneurysmal aorta without dissection. Mild aortic atherosclerosis. Coronary vascular calcification. Borderline to mild cardiomegaly. No pericardial effusion Mediastinum/Nodes: Midline trachea. No thyroid mass. Esophagus within normal limits. No suspicious adenopathy Lungs/Pleura: Trace left-sided pleural effusion. Partial consolidation and ground-glass density in the left lower lobe favored to represent pneumonia over infarct. Mild atelectasis right base. Upper Abdomen: No acute abnormality. Musculoskeletal: No chest wall abnormality. No acute or significant osseous findings. Review of the MIP images confirms the above findings. IMPRESSION: 1. Positive for acute bilateral pulmonary emboli as described above. No evidence for right heart strain. 2. Partial consolidation and ground-glass density in the left lower lobe favored to represent pneumonia or aspiration over infarct. Trace left-sided pleural effusion. 3. Aortic atherosclerosis. Critical Value/emergent results were called by telephone at the time of interpretation on 08/15/2020 at 2:08 am to provider Penina Reisner , who verbally acknowledged these results. Aortic Atherosclerosis (ICD10-I70.0). Electronically Signed   By: Donavan Foil M.D.   On: 08/15/2020 02:08   CT ABDOMEN PELVIS W CONTRAST  Result Date: 08/15/2020 CLINICAL DATA:  Left upper quadrant pain EXAM: CT ABDOMEN AND PELVIS WITH CONTRAST TECHNIQUE: Multidetector CT imaging of the abdomen and pelvis was performed using the standard protocol following bolus administration of intravenous contrast.  CONTRAST:  174mL OMNIPAQUE IOHEXOL 350 MG/ML SOLN COMPARISON:  None FINDINGS: Lower chest: Lung bases demonstrate small left pleural effusion. Airspace disease in the left lung base which may reflect atelectasis, pneumonia or aspiration. Mild atelectasis right base Hepatobiliary: No focal liver abnormality is seen. No gallstones, gallbladder wall thickening, or biliary dilatation. Pancreas: Unremarkable. No pancreatic ductal dilatation or surrounding inflammatory changes. Spleen: Normal in size without focal abnormality. Adrenals/Urinary Tract: Adrenal glands are unremarkable. Kidneys are normal, without renal calculi, focal lesion, or hydronephrosis. Bladder is unremarkable. Stomach/Bowel: Stomach is within normal limits. Appendix appears normal. No evidence of bowel wall thickening, distention, or inflammatory changes. Vascular/Lymphatic: Mild aortic atherosclerosis. No aneurysm. No suspicious nodes Reproductive: Prostate is unremarkable. Other: Negative for free air or free fluid. Fat within the bilateral inguinal canals. Musculoskeletal: Hardware with artifact in the left femur. Fat density mass superficial to the right paraspinal muscles with small internal vessels. There are small calcifications within. The mass measures approximately 10.8 by 4.2 by 14 cm. IMPRESSION: 1. No CT evidence for acute intra-abdominal or pelvic abnormality. 2. Small left pleural effusion. Airspace disease in the left lung base which may reflect atelectasis, pneumonia or aspiration. 3. 10.8 x 4.2 x 14 cm fat density mass superficial to the right paraspinal muscles with small internal vessels and small calcifications within. The presence of enhancing of internal enhancing vessels is somewhat unusual; suggest lumbar MRI for further characterization of the mass. This may be performed on a nonemergent basis. Aortic Atherosclerosis (ICD10-I70.0). Electronically Signed   By: Donavan Foil M.D.   On: 08/15/2020 02:12   DG Chest Portable 1  View  Result Date: 08/15/2020 CLINICAL DATA:  Pleuritic chest pain EXAM: PORTABLE CHEST 1 VIEW COMPARISON:  PET CT 04/21/2020 FINDINGS: Right-sided central venous port tip over the SVC. Small left-sided pleural effusion with basilar airspace disease. Borderline cardiomegaly. No pneumothorax IMPRESSION: Small left pleural effusion with basilar airspace disease, atelectasis or pneumonia. Electronically Signed   By: Donavan Foil M.D.   On: 08/15/2020 00:12     Imaging studies, please see full reports above however highly pertinent findings reviewed by me and discussed with the radiologist.  Patient has bilateral pulmonary emboli without evidence of right heart strain.  In addition some consolidation in left lower lung and trace pleural effusion.  Also noted is a mass superficial to the right paraspinal muscles. ____________________________________________   PROCEDURES  Procedure(s) performed: None  Procedures  Critical Care performed: Yes, see critical care note(s)  CRITICAL CARE Performed by: Delman Kitten   Total critical care time: 30 minutes  Critical care time was exclusive of separately billable procedures and treating other patients.  Critical care was necessary to treat or prevent imminent or life-threatening deterioration.  Critical care was time spent personally by me on the following activities: development of treatment plan with patient and/or surrogate as well as nursing, discussions with consultants, evaluation of patient's response to treatment, examination of patient, obtaining history from patient or surrogate, ordering and performing treatments and interventions, ordering and review of laboratory studies, ordering and review of radiographic studies, pulse oximetry and re-evaluation of patient's condition.  ____________________________________________   INITIAL IMPRESSION / ASSESSMENT AND PLAN / ED COURSE  Pertinent labs & imaging results that were available during my  care of the patient were reviewed by me and considered in my medical decision making (see chart for details).   Differential diagnosis includes, but is not limited to, ACS, aortic dissection, pulmonary embolism, cardiac tamponade, pneumothorax, pneumonia, pericarditis, myocarditis, GI-related causes including esophagitis/gastritis, and musculoskeletal chest wall pain.    Also include in the differential is left upper quadrant abdominal pain.  His symptoms however seem very pleuritic nature.  With his history of cancer in this new diagnosis and tachycardia concern for pulmonary embolism, pneumothorax, pleural effusion, ammonia etc. are high.  Anticipate proceeding with imaging studies, but await CBC and chemistry especially wishing to know the patient's renal function with his new edema and history of mild AKI following regularly with oncology.   ----------------------------------------- 3:32 AM on 08/15/2020 -----------------------------------------  Admission discussed with Dr. Normand Sloop of the hospitalist service.  Patient initiated on heparin for bilateral pulmonary emboli.  Updated the patient, patient understanding agreeable plan for admission.  He does report that at times he will gag and seemingly vomit but sometimes goes back down anything sometimes into his lungs over the last several months.  With this known, will consider also possibility of aspiration.  Procalcitonin ordered, Dr. Normand Sloop has notified me that he is already intending to start the patient on antibiotic.  Seems reasonable.  Patient alert well oriented, vital signs normal with exception of slight hypertension at this point.  Understand agreeable with plan, pain starting to slowly creep back up the left side pleuritic in nature, will give additional small dose of morphine now.  ____________________________________________   FINAL CLINICAL IMPRESSION(S) / ED DIAGNOSES  Final diagnoses:  Bilateral pulmonary embolism (HCC)   Aspiration pneumonia of left lower lobe, unspecified aspiration pneumonia type ([place])  Paraspinal mass        Note:  This document was prepared using Dragon voice recognition software and may include unintentional dictation errors       Delman Kitten, MD 08/15/20 0626    Delman Kitten, MD 08/15/20 [phone]

## 2020-08-15 NOTE — ED Provider Notes (Signed)
Patient fully awake and alert, reports pain recurring moderate to severe.  Will give additional morphine at this time.  Awaiting admission to hospital.   Delman Kitten, MD 08/15/20 [phone]

## 2020-08-15 NOTE — Progress Notes (Signed)
[place] for heparin infusion Indication: pulmonary embolus  Allergies  Allergen Reactions  . Penicillins     Unknown, allergy from an infant    Patient Measurements: Height: 5\' 10"  (177.8 cm) Weight: 107.7 kg (237 lb 7 oz) IBW/kg (Calculated) : 73 Heparin Dosing Weight: 100.6 kg  Vital Signs: Temp: 97.4 F (36.3 C) (04/10 0839) Temp Source: Oral (04/10 0839) BP: 152/90 (04/10 0839) Pulse Rate: 93 (04/10 0839)  Labs: Recent Labs    08/15/20 0006 08/15/20 0140 08/15/20 0236 08/15/20 0434 08/15/20 0830  HGB 10.0*  --   --  8.7*  --   HCT 30.1*  --   --  26.0*  --   PLT 164  --   --  141*  --   APTT  --   --  36  --   --   LABPROT  --   --  13.8  --   --   INR  --   --  1.1  --   --   HEPARINUNFRC  --   --   --   --  0.39  CREATININE 1.27*  --   --  1.22  --   TROPONINIHS 9 8  --   --   --     Estimated Creatinine Clearance: 81.1 mL/min (by C-G formula based on SCr of 1.22 mg/dL).   Medical History: Past Medical History:  Diagnosis Date  . Diabetes mellitus without complication ([place])   . Hypertension   . Mucositis     Assessment: Pt is 60 yo male presenting to ER due to worsening/radiating left sided chest pain.  CT Chest: "Positive for acute bilateral pulmonary emboli."  4/10 0830 HL 0.39   Goal of Therapy:  Heparin level 0.3-0.7 units/ml Monitor platelets by anticoagulation protocol: Yes   Plan:  Heparin level is therapeutic. Will continue current rate at 1500 units/hr. Recheck heparin level in 6 hours. CBC daily while on heparin.   Eleonore Chiquito, PharmD, BCPS 08/15/2020 9:50 AM

## 2020-08-15 NOTE — Progress Notes (Signed)
Consult placed for second IV site. Secure chat sent to Natale Milch, RN to notify that IV Team will not be available until 7am, and that pt has a portacath that can be accessed.

## 2020-08-15 NOTE — Progress Notes (Signed)
[place] for heparin infusion Indication: pulmonary embolus  Allergies  Allergen Reactions  . Penicillins     Unknown, allergy from an infant    Patient Measurements: Height: 5\' 10"  (177.8 cm) Weight: 122.5 kg (270 lb) IBW/kg (Calculated) : 73 Heparin Dosing Weight: 100.6 kg  Vital Signs: Temp: 98.1 F (36.7 C) (04/09 2310) Temp Source: Oral (04/09 2310) BP: 155/88 (04/10 0200) Pulse Rate: 85 (04/10 0200)  Labs: Recent Labs    08/15/20 0006  HGB 10.0*  HCT 30.1*  PLT 164  CREATININE 1.27*  TROPONINIHS 9    Estimated Creatinine Clearance: 83.2 mL/min (A) (by C-G formula based on SCr of 1.27 mg/dL (H)).   Medical History: Past Medical History:  Diagnosis Date  . Diabetes mellitus without complication ([place])   . Hypertension   . Mucositis     Assessment: Pt is 60 yo male presenting to ER due to worsening/radiating left sided chest pain.  CT Chest: "Positive for acute bilateral pulmonary emboli."  Goal of Therapy:  Heparin level 0.3-0.7 units/ml Monitor platelets by anticoagulation protocol: Yes   Plan:  Give 6000 units bolus x 1 Start heparin infusion at 1500 units/hr Check anti-Xa level in 6 hours and daily while on heparin Continue to monitor H&H and platelets  Renda Rolls, PharmD, [facility] 08/15/2020 2:35 AM

## 2020-08-15 NOTE — Consult Note (Signed)
[facility]  Date of admission:  08/15/2020  Inpatient day:  08/15/2020  Consulting physician: Dr Nolberto Hanlon.   Reason for Consultation:  H/o tongue cancer s/p chemotherapy with pulmonary embolism, thrombocytopenia and anemia  Chief Complaint: Joshua Bray is a 60 y.o. 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  He completed therapy on 06/30/2020.  Posttreatment he has persistent neck adenopathy.  He has been followed by Dr. Richardson Landry as well as Dr. Rogue Bussing.  He is awaiting referral to [facility].  He has a PET scan scheduled in 09/2020.  He was at his baseline health when he began to experience increasing pleuritic pain over the past 2 days.  Pain has been left-sided and worse on deep breathing up.  He has been taking shallow breaths.  He has some shortness of breath and a little cough.  He denies any lower extremity edema.  He describes an episode of numbness in his left arm after laying on the couch watching videos while holding his phone.  Times have subsequently resolved.  He denied any in his hand or arm.  He presented to the emergency room.  CXR revealed small left-sided effusion with basilar airspace disease, atelectasis or pneumonia.  Chest CT angiogram revealed bilateral pulmonary emboli and no evidence of right heart strain.  There was partial consolidation and groundglass density in the LLL favored to represent pneumonia or aspiration over infarct.  Abdominal and pelvic CT scan revealed no evidence of intra-abdominal or pelvic adenopathy.  There was a 10.8 x 4.2 x 14 cm fat density mass superficial to the right paraspinal muscles with small internal vessels and small calcifications.  The presence of internal enhancing vessels  was somewhat unusual and recommendation was for consideration of lumbar spine MRI.  Symptomatically, he notes of right sided pleuritic chest pain.  He denies any lower extremity edema.  He has ongoing issues with reflux.  He is unaware of a right paraspinal mass.   Past Medical History:  Diagnosis Date  . Diabetes mellitus without complication ([place])   . Hypertension   . Mucositis     Past Surgical History:  Procedure Laterality Date  . FEMUR SURGERY Left 1983  . IR IMAGING GUIDED PORT INSERTION  04/16/2020    Family History  Problem Relation Age of Onset  . Thyroid disease Mother   . Leukemia Father   . Heart disease Father   . Diabetes Father     Social History:  reports that he quit smoking about 30 years ago. He quit after 4.00 years of use. He has never used smokeless tobacco. He reports current alcohol use of about 5.0 standard drinks of alcohol per week. He reports previous drug use.  The patient is a Engineer, agricultural.  He quit smoking in 1992.  He lives with his wife in [place].  He is alone today.  Allergies:  Allergies  Allergen Reactions  . Penicillins     Unknown, allergy from an infant    Medications Prior to Admission  Medication Sig Dispense Refill  . acetaminophen (TYLENOL) 500 MG tablet Take 1,000 mg by mouth 2 (two) times daily.    Marland Kitchen amLODipine (NORVASC) 10 MG tablet Take 1 tablet (10 mg total) by mouth daily. (Patient not taking:  No sig reported) 90 tablet 1  . ezetimibe (ZETIA) 10 MG tablet Take 1 tablet (10 mg total) by mouth daily. (Patient not taking: No sig reported) 90 tablet 3  . metFORMIN (GLUCOPHAGE) 500 MG tablet Take 1 tablet (500 mg total) by mouth 2 (two) times daily with a meal. (Patient not taking: No sig reported) 180 tablet 1  . Morphine Sulfate (MORPHINE CONCENTRATE) 10 mg / 0.5 ml concentrated solution Take 0.25 mLs (5 mg total) by mouth every 6 (six) hours as needed for anxiety (difficulty sleeping). (Patient not taking: No  sig reported) 30 mL 0  . mupirocin ointment (BACTROBAN) 2 % Apply 1 application topically 2 (two) times daily. (Patient not taking: No sig reported) 22 g 1  . nystatin (MYCOSTATIN/NYSTOP) powder Apply 1 application topically 3 (three) times daily. (Patient not taking: No sig reported) 15 g 0  . ondansetron (ZOFRAN) 8 MG tablet Take 1 tablet (8 mg total) by mouth every 8 (eight) hours as needed for nausea or vomiting. (Patient not taking: No sig reported) 30 tablet 4  . prochlorperazine (COMPAZINE) 10 MG tablet Take 1 tablet (10 mg total) by mouth every 6 (six) hours as needed for nausea or vomiting. (Patient not taking: No sig reported) 30 tablet 0  . rosuvastatin (CRESTOR) 20 MG tablet Take 1 tablet (20 mg total) by mouth daily. (Patient not taking: No sig reported) 90 tablet 3  . sucralfate (CARAFATE) 1 g tablet Take 1 tablet (1 g total) by mouth 3 (three) times daily. Dissolve in 3-4 tbsp warm water, swish and swallow. (Patient not taking: No sig reported) 90 tablet 1    Review of Systems  Constitutional: Positive for malaise/fatigue. Negative for chills, diaphoresis, fever and weight loss.  HENT: Negative for congestion, ear pain, nosebleeds and sinus pain.   Eyes: Negative for blurred vision, double vision and photophobia.  Respiratory: Positive for cough (slight) and shortness of breath. Negative for hemoptysis, sputum production and wheezing.        Pleuritic left sided chest pain.  Cardiovascular: Negative.  Negative for chest pain, palpitations, orthopnea and leg swelling.  Gastrointestinal: Positive for heartburn. Negative for abdominal pain, blood in stool, constipation, diarrhea, melena, nausea and vomiting.  Genitourinary: Negative.  Negative for dysuria, frequency, hematuria and urgency.  Musculoskeletal: Negative.  Negative for back pain, myalgias and neck pain.  Skin: Negative.  Negative for rash.       Unaware of a lipoma.  Neurological: Negative for dizziness, tingling,  tremors, sensory change, speech change, focal weakness and headaches.  Endo/Heme/Allergies: Negative.  Does not bruise/bleed easily.  Psychiatric/Behavioral: Negative.  Negative for depression and memory loss. The patient is not nervous/anxious and does not have insomnia.     Vitals:  Blood pressure (!) 152/90, pulse 93, temperature (!) 97.4 F (36.3 C), temperature source Oral, resp. rate 18, height 5\' 10"  (1.778 m), weight 237 lb 7 oz (107.7 kg), SpO2 97 %.   Physical Exam Vitals and nursing note reviewed.  Constitutional:      General: He is not in acute distress.    Appearance: He is well-developed. He is not ill-appearing or toxic-appearing.  HENT:     Head: Normocephalic and atraumatic.     Mouth/Throat:     Mouth: Mucous membranes are moist.     Pharynx: Oropharynx is clear.  Eyes:     General: No scleral icterus.    Extraocular Movements: Extraocular movements intact.     Pupils: Pupils are equal, round, and reactive  to light.  Cardiovascular:     Rate and Rhythm: Regular rhythm.  Pulmonary:     Effort: Pulmonary effort is normal. No respiratory distress.     Breath sounds: Rales (left lower lobe) present. No wheezing or rhonchi.  Chest:  Breasts:     Right: No axillary adenopathy or supraclavicular adenopathy.     Left: No axillary adenopathy or supraclavicular adenopathy.    Abdominal:     General: Bowel sounds are normal.     Tenderness: There is no abdominal tenderness. There is no guarding or rebound.  Musculoskeletal:        General: No swelling.     Right lower leg: No edema.     Left lower leg: No edema.     Comments: Lower lumbar right paraspinal lipoma.  Lymphadenopathy:     Upper Body:     Right upper body: No supraclavicular or axillary adenopathy.     Left upper body: No supraclavicular or axillary adenopathy.     Lower Body: No right inguinal adenopathy. No left inguinal adenopathy.     Comments: Prominent bilateral cervical adenopathy (left >  right).  Skin:    General: Skin is warm and dry.     Coloration: Skin is not cyanotic, jaundiced or pale.     Findings: No erythema or rash.     Comments: Brawny changes lower extremities.  Neurological:     General: No focal deficit present.     Mental Status: He is alert and oriented to person, place, and time.  Psychiatric:        Mood and Affect: Mood is not anxious or depressed.        Behavior: Behavior normal.     Results for orders placed or performed during the hospital encounter of 08/14/20 (from the past 48 hour(s))  Comprehensive metabolic panel     Status: Abnormal   Collection Time: 08/15/20 12:06 AM  Result Value Ref Range   Sodium 134 (L) 135 - 145 mmol/L   Potassium 3.9 3.5 - 5.1 mmol/L   Chloride 100 98 - 111 mmol/L   CO2 26 22 - 32 mmol/L   Glucose, Bld 139 (H) 70 - 99 mg/dL    Comment: Glucose reference range applies only to samples taken after fasting for at least 8 hours.   BUN 18 6 - 20 mg/dL   Creatinine, Ser 1.27 (H) 0.61 - 1.24 mg/dL   Calcium 9.1 8.9 - 10.3 mg/dL   Total Protein 7.2 6.5 - 8.1 g/dL   Albumin 3.6 3.5 - 5.0 g/dL   AST 15 15 - 41 U/L   ALT 9 0 - 44 U/L   Alkaline Phosphatase 98 38 - 126 U/L   Total Bilirubin 1.1 0.3 - 1.2 mg/dL   GFR, Estimated >60 >60 mL/min    Comment: (NOTE) Calculated using the CKD-EPI Creatinine Equation (2021)    Anion gap 8 5 - 15    Comment: Performed at [facility], [address] [place], [place] [address]  Lipase, blood     Status: None   Collection Time: 08/15/20 12:06 AM  Result Value Ref Range   Lipase 49 11 - 51 U/L    Comment: Performed at [facility], [address] [place], [place] [address]  Brain natriuretic peptide     Status: None   Collection Time: 08/15/20 12:06 AM  Result Value Ref Range   B Natriuretic Peptide 28.0 0.0 - 100.0 pg/mL    Comment: Performed  at [facility], [place], [place] [address]  Troponin I (High Sensitivity)      Status: None   Collection Time: 08/15/20 12:06 AM  Result Value Ref Range   Troponin I (High Sensitivity) 9 <18 ng/L    Comment: (NOTE) Elevated high sensitivity troponin I (hsTnI) values and significant  changes across serial measurements may suggest ACS but many other  chronic and acute conditions are known to elevate hsTnI results.  Refer to the "Links" section for chest pain algorithms and additional  guidance. Performed at [facility], [address] [place], [place] [address]   Lactic acid, plasma     Status: None   Collection Time: 08/15/20 12:06 AM  Result Value Ref Range   Lactic Acid, Venous 1.2 0.5 - 1.9 mmol/L    Comment: Performed at [facility], [address] [place],  [address]  CBC with Differential     Status: Abnormal   Collection Time: 08/15/20 12:06 AM  Result Value Ref Range   WBC 4.8 4.0 - 10.5 K/uL   RBC 2.98 (L) 4.22 - 5.81 MIL/uL   Hemoglobin 10.0 (L) 13.0 - 17.0 g/dL   HCT 30.1 (L) 39.0 - 52.0 %   MCV 101.0 (H) 80.0 - 100.0 fL   MCH 33.6 26.0 - 34.0 pg   MCHC 33.2 30.0 - 36.0 g/dL   RDW 18.9 (H) 11.5 - 15.5 %   Platelets 164 150 - 400 K/uL   nRBC 0.0 0.0 - 0.2 %   Neutrophils Relative % 77 %   Neutro Abs 3.7 1.7 - 7.7 K/uL   Lymphocytes Relative 8 %   Lymphs Abs 0.4 (L) 0.7 - 4.0 K/uL   Monocytes Relative 13 %   Monocytes Absolute 0.6 0.1 - 1.0 K/uL   Eosinophils Relative 1 %   Eosinophils Absolute 0.1 0.0 - 0.5 K/uL   Basophils Relative 0 %   Basophils Absolute 0.0 0.0 - 0.1 K/uL   Immature Granulocytes 1 %   Abs Immature Granulocytes 0.05 0.00 - 0.07 K/uL    Comment: Performed at [facility], [place], [place] [address]  Troponin I (High Sensitivity)     Status: None   Collection Time: 08/15/20  1:40 AM  Result Value Ref Range   Troponin I (High Sensitivity) 8 <18 ng/L    Comment: (NOTE) Elevated high sensitivity troponin I (hsTnI) values and significant  changes across serial  measurements may suggest ACS but many other  chronic and acute conditions are known to elevate hsTnI results.  Refer to the "Links" section for chest pain algorithms and additional  guidance. Performed at [facility], [address] [place],  [address]   Urinalysis, Complete w Microscopic     Status: Abnormal   Collection Time: 08/15/20  2:36 AM  Result Value Ref Range   Color, Urine YELLOW (A) YELLOW   APPearance CLEAR (A) CLEAR   Specific Gravity, Urine >1.046 (H) 1.005 - 1.030   pH 5.0 5.0 - 8.0   Glucose, UA NEGATIVE NEGATIVE mg/dL   Hgb urine dipstick NEGATIVE NEGATIVE   Bilirubin Urine NEGATIVE NEGATIVE   Ketones, ur NEGATIVE NEGATIVE mg/dL   Protein, ur NEGATIVE NEGATIVE mg/dL   Nitrite NEGATIVE NEGATIVE   Leukocytes,Ua NEGATIVE NEGATIVE   RBC / HPF 0-5 0 - 5 RBC/hpf   WBC, UA 0-5 0 - 5 WBC/hpf   Bacteria, UA NONE SEEN NONE SEEN   Squamous Epithelial / LPF 0-5  0 - 5   Mucus PRESENT     Comment: Performed at [facility], [address] [place], [place] [address]  Protime-INR     Status: None   Collection Time: 08/15/20  2:36 AM  Result Value Ref Range   Prothrombin Time 13.8 11.4 - 15.2 seconds   INR 1.1 0.8 - 1.2    Comment: (NOTE) INR goal varies based on device and disease states. Performed at [facility], [address] [place], [place] [address]   APTT     Status: None   Collection Time: 08/15/20  2:36 AM  Result Value Ref Range   aPTT 36 24 - 36 seconds    Comment: Performed at [facility], [place], [place] [address]  Procalcitonin - Baseline     Status: None   Collection Time: 08/15/20  2:36 AM  Result Value Ref Range   Procalcitonin 0.10 ng/mL    Comment:        Interpretation: PCT (Procalcitonin) <= 0.5 ng/mL: Systemic infection (sepsis) is not likely. Local bacterial infection is possible. (NOTE)       Sepsis PCT Algorithm           Lower Respiratory Tract                                       Infection PCT Algorithm    ----------------------------     ----------------------------         PCT < 0.25 ng/mL                PCT < 0.10 ng/mL          Strongly encourage             Strongly discourage   discontinuation of antibiotics    initiation of antibiotics    ----------------------------     -----------------------------       PCT 0.25 - 0.50 ng/mL            PCT 0.10 - 0.25 ng/mL               OR       >80% decrease in PCT            Discourage initiation of                                            antibiotics      Encourage discontinuation           of antibiotics    ----------------------------     -----------------------------         PCT >= 0.50 ng/mL              PCT 0.26 - 0.50 ng/mL               AND        <80% decrease in PCT             Encourage initiation of                                             antibiotics       Encourage continuation  of antibiotics    ----------------------------     -----------------------------        PCT >= 0.50 ng/mL                  PCT > 0.50 ng/mL               AND         increase in PCT                  Strongly encourage                                      initiation of antibiotics    Strongly encourage escalation           of antibiotics                                     -----------------------------                                           PCT <= 0.25 ng/mL                                                 OR                                        > 80% decrease in PCT                                      Discontinue / Do not initiate                                             antibiotics  Performed at [facility], [address], [place], [place] [address]   Basic metabolic panel     Status: Abnormal   Collection Time: 08/15/20  4:34 AM  Result Value Ref Range   Sodium 134 (L) 135 - 145 mmol/L   Potassium 4.2 3.5 - 5.1 mmol/L   Chloride 100 98 - 111 mmol/L   CO2 28 22 - 32  mmol/L   Glucose, Bld 137 (H) 70 - 99 mg/dL    Comment: Glucose reference range applies only to samples taken after fasting for at least 8 hours.   BUN 19 6 - 20 mg/dL   Creatinine, Ser 1.22 0.61 - 1.24 mg/dL   Calcium 8.7 (L) 8.9 - 10.3 mg/dL   GFR, Estimated >60 >60 mL/min    Comment: (NOTE) Calculated using the CKD-EPI Creatinine Equation (2021)    Anion gap 6 5 - 15    Comment: Performed at [facility], [address] [place], [place] [address]  CBC     Status: Abnormal   Collection Time: 08/15/20  4:34 AM  Result Value Ref Range  WBC 4.5 4.0 - 10.5 K/uL   RBC 2.57 (L) 4.22 - 5.81 MIL/uL   Hemoglobin 8.7 (L) 13.0 - 17.0 g/dL   HCT 26.0 (L) 39.0 - 52.0 %   MCV 101.2 (H) 80.0 - 100.0 fL   MCH 33.9 26.0 - 34.0 pg   MCHC 33.5 30.0 - 36.0 g/dL   RDW 18.8 (H) 11.5 - 15.5 %   Platelets 141 (L) 150 - 400 K/uL   nRBC 0.0 0.0 - 0.2 %    Comment: Performed at [facility], [place], [place] [address]  Heparin level (unfractionated)     Status: None   Collection Time: 08/15/20  8:30 AM  Result Value Ref Range   Heparin Unfractionated 0.39 0.30 - 0.70 IU/mL    Comment: (NOTE) If heparin results are below expected values, and patient dosage has  been confirmed, suggest follow up testing of antithrombin III levels. Performed at [facility], [address] [place],  [address]   Glucose, capillary     Status: Abnormal   Collection Time: 08/15/20  9:09 AM  Result Value Ref Range   Glucose-Capillary 119 (H) 70 - 99 mg/dL    Comment: Glucose reference range applies only to samples taken after fasting for at least 8 hours.   CT Angio Chest PE W and/or Wo Contrast  Result Date: 08/15/2020 CLINICAL DATA:  Chest pain short of breath EXAM: CT ANGIOGRAPHY CHEST WITH CONTRAST TECHNIQUE: Multidetector CT imaging of the chest was performed using the standard protocol during bolus administration of intravenous contrast. Multiplanar CT image  reconstructions and MIPs were obtained to evaluate the vascular anatomy. CONTRAST:  114mL OMNIPAQUE IOHEXOL 350 MG/ML SOLN COMPARISON:  PET CT 04/21/2020 FINDINGS: Cardiovascular: Satisfactory opacification of the pulmonary arteries to the segmental level. Acute filling defect within right middle lobe pulmonary artery with extension into segmental and subsegmental branches. Small acute emboli within right lower lobe segmental and subsegmental vessels. Acute left lower lobe subsegmental pulmonary embolus. No evidence for right heart strain. Nonaneurysmal aorta without dissection. Mild aortic atherosclerosis. Coronary vascular calcification. Borderline to mild cardiomegaly. No pericardial effusion Mediastinum/Nodes: Midline trachea. No thyroid mass. Esophagus within normal limits. No suspicious adenopathy Lungs/Pleura: Trace left-sided pleural effusion. Partial consolidation and ground-glass density in the left lower lobe favored to represent pneumonia over infarct. Mild atelectasis right base. Upper Abdomen: No acute abnormality. Musculoskeletal: No chest wall abnormality. No acute or significant osseous findings. Review of the MIP images confirms the above findings. IMPRESSION: 1. Positive for acute bilateral pulmonary emboli as described above. No evidence for right heart strain. 2. Partial consolidation and ground-glass density in the left lower lobe favored to represent pneumonia or aspiration over infarct. Trace left-sided pleural effusion. 3. Aortic atherosclerosis. Critical Value/emergent results were called by telephone at the time of interpretation on 08/15/2020 at 2:08 am to provider MARK QUALE , who verbally acknowledged these results. Aortic Atherosclerosis (ICD10-I70.0). Electronically Signed   By: Donavan Foil M.D.   On: 08/15/2020 02:08   CT ABDOMEN PELVIS W CONTRAST  Result Date: 08/15/2020 CLINICAL DATA:  Left upper quadrant pain EXAM: CT ABDOMEN AND PELVIS WITH CONTRAST TECHNIQUE: Multidetector  CT imaging of the abdomen and pelvis was performed using the standard protocol following bolus administration of intravenous contrast. CONTRAST:  166mL OMNIPAQUE IOHEXOL 350 MG/ML SOLN COMPARISON:  None FINDINGS: Lower chest: Lung bases demonstrate small left pleural effusion. Airspace disease in the left lung base which may reflect atelectasis, pneumonia or aspiration. Mild atelectasis right  base Hepatobiliary: No focal liver abnormality is seen. No gallstones, gallbladder wall thickening, or biliary dilatation. Pancreas: Unremarkable. No pancreatic ductal dilatation or surrounding inflammatory changes. Spleen: Normal in size without focal abnormality. Adrenals/Urinary Tract: Adrenal glands are unremarkable. Kidneys are normal, without renal calculi, focal lesion, or hydronephrosis. Bladder is unremarkable. Stomach/Bowel: Stomach is within normal limits. Appendix appears normal. No evidence of bowel wall thickening, distention, or inflammatory changes. Vascular/Lymphatic: Mild aortic atherosclerosis. No aneurysm. No suspicious nodes Reproductive: Prostate is unremarkable. Other: Negative for free air or free fluid. Fat within the bilateral inguinal canals. Musculoskeletal: Hardware with artifact in the left femur. Fat density mass superficial to the right paraspinal muscles with small internal vessels. There are small calcifications within. The mass measures approximately 10.8 by 4.2 by 14 cm. IMPRESSION: 1. No CT evidence for acute intra-abdominal or pelvic abnormality. 2. Small left pleural effusion. Airspace disease in the left lung base which may reflect atelectasis, pneumonia or aspiration. 3. 10.8 x 4.2 x 14 cm fat density mass superficial to the right paraspinal muscles with small internal vessels and small calcifications within. The presence of enhancing of internal enhancing vessels is somewhat unusual; suggest lumbar MRI for further characterization of the mass. This may be performed on a nonemergent  basis. Aortic Atherosclerosis (ICD10-I70.0). Electronically Signed   By: Donavan Foil M.D.   On: 08/15/2020 02:12   DG Chest Portable 1 View  Result Date: 08/15/2020 CLINICAL DATA:  Pleuritic chest pain EXAM: PORTABLE CHEST 1 VIEW COMPARISON:  PET CT 04/21/2020 FINDINGS: Right-sided central venous port tip over the SVC. Small left-sided pleural effusion with basilar airspace disease. Borderline cardiomegaly. No pneumothorax IMPRESSION: Small left pleural effusion with basilar airspace disease, atelectasis or pneumonia. Electronically Signed   By: Donavan Foil M.D.   On: 08/15/2020 00:12    Assessment:  The patient is a 60 y.o. 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  He presented with bilateral pulmonary emboli.  He is on heparin.  Chest CT angiogram on 08/15/2020 revealed bilateral pulmonary emboli and no evidence of right heart strain.  There was partial consolidation and groundglass density in the LLL favored to represent pneumonia or aspiration over infarct.  Abdominal and pelvic CT on 08/15/2020 revealed no evidence of intra-abdominal or pelvic adenopathy.  There was a 10.8 x 4.2 x 14 cm fat density mass superficial to the right paraspinal muscles with small internal vessels and small calcifications.  The presence of internal enhancing vessels was somewhat unusual and recommendation was for consideration of lumbar spine MRI.  Symptomatically, he has left sided pleuritic chest pain.  He denies any lower extremity edema.  He has ongoing issues with reflux.  He is unaware of a right paraspinal mass.  Plan:   1.   Stage II squamous cell carcinoma base of tongue  He has residual neck adenopathy s/p concurrent chemotherapy and radiation.  Patient has PET scan planned in 09/2020.  He will be seeing ENT at [facility] for possible surgery. 2.   Bilateral pulmonary emboli  Etiology likely secondary to  underlying malignancy.  Check limited hypercoagulable work-up.  Continue heparin.  Consider switch to Eliquis or Xarelto prior to discharge.  Bilateral lower extremity duplex ordered. 3.   Anemia and thrombocytopenia  Hematocrit 26.0.  Hemoglobin 8.7.  MCV 101.2.  Platelets 141,000 today.  Counts have been slightly low s/p completion of treatment.  Anemia work-up ordered. 4.   Right paraspinal mass  Clinically, lesion appears to be a lipoma.  Imaging reviewed with radiology and was present and unchanged from original PET scan.  Anticipate follow-up with imaging scheduled in 09/2020 with no planned additional imaging at present. 5.   Left lower lobe pneumonia  Patient empirically placed on ceftriaxone and azithromycin.  Follow cultures.   Thank you for allowing me to participate in Keyshun Elpers 's care.  I will follow him closely with you while hospitalized and after discharge in the outpatient department until Dr Aletha Halim return on Monday, 08/16/2020.   Lequita Asal, MD  08/15/2020, 11:28 AM

## 2020-08-16 ENCOUNTER — Inpatient Hospital Stay: Payer: 59

## 2020-08-16 ENCOUNTER — Inpatient Hospital Stay (HOSPITAL_COMMUNITY)
Admit: 2020-08-16 | Discharge: 2020-08-16 | Disposition: A | Discharge status: Home / Self Care | Payer: 59 | Attending: Family Medicine | Admitting: Family Medicine

## 2020-08-16 DIAGNOSIS — D518 Other vitamin B12 deficiency anemias: Secondary | ICD-10-CM

## 2020-08-16 DIAGNOSIS — E860 Dehydration: Secondary | ICD-10-CM

## 2020-08-16 DIAGNOSIS — R590 Localized enlarged lymph nodes: Secondary | ICD-10-CM | POA: Diagnosis not present

## 2020-08-16 DIAGNOSIS — D6481 Anemia due to antineoplastic chemotherapy: Secondary | ICD-10-CM

## 2020-08-16 DIAGNOSIS — I2699 Other pulmonary embolism without acute cor pulmonale: Secondary | ICD-10-CM | POA: Diagnosis not present

## 2020-08-16 DIAGNOSIS — N179 Acute kidney failure, unspecified: Secondary | ICD-10-CM

## 2020-08-16 DIAGNOSIS — I2609 Other pulmonary embolism with acute cor pulmonale: Secondary | ICD-10-CM

## 2020-08-16 DIAGNOSIS — Z7901 Long term (current) use of anticoagulants: Secondary | ICD-10-CM

## 2020-08-16 DIAGNOSIS — T451X5A Adverse effect of antineoplastic and immunosuppressive drugs, initial encounter: Secondary | ICD-10-CM

## 2020-08-16 DIAGNOSIS — C01 Malignant neoplasm of base of tongue: Secondary | ICD-10-CM | POA: Diagnosis not present

## 2020-08-16 LAB — ECHOCARDIOGRAM COMPLETE
AR max vel: 3.04 cm2
AV Area VTI: 2.96 cm2
AV Area mean vel: 2.71 cm2
AV Mean grad: 6 mmHg
AV Peak grad: 8.4 mmHg
Ao pk vel: 1.45 m/s
Area-P 1/2: 4.17 cm2
Height: 70 in
MV VTI: 3.54 cm2
S' Lateral: 2.7 cm
Weight: 4289.6 oz

## 2020-08-16 LAB — LEGIONELLA PNEUMOPHILA SEROGP 1 UR AG: L. pneumophila Serogp 1 Ur Ag: NEGATIVE

## 2020-08-16 LAB — CBC
HCT: 23.2 % — ABNORMAL LOW (ref 39.0–52.0)
Hemoglobin: 7.6 g/dL — ABNORMAL LOW (ref 13.0–17.0)
MCH: 33.3 pg (ref 26.0–34.0)
MCHC: 32.8 g/dL (ref 30.0–36.0)
MCV: 101.8 fL — ABNORMAL HIGH (ref 80.0–100.0)
Platelets: 135 10*3/uL — ABNORMAL LOW (ref 150–400)
RBC: 2.28 MIL/uL — ABNORMAL LOW (ref 4.22–5.81)
RDW: 18.4 % — ABNORMAL HIGH (ref 11.5–15.5)
WBC: 3.8 10*3/uL — ABNORMAL LOW (ref 4.0–10.5)
nRBC: 0 % (ref 0.0–0.2)

## 2020-08-16 LAB — IRON AND TIBC
Iron: 31 ug/dL — ABNORMAL LOW (ref 45–182)
Saturation Ratios: 17 % — ABNORMAL LOW (ref 17.9–39.5)
TIBC: 181 ug/dL — ABNORMAL LOW (ref 250–450)
UIBC: 150 ug/dL

## 2020-08-16 LAB — VITAMIN B12: Vitamin B-12: 197 pg/mL (ref 180–914)

## 2020-08-16 LAB — TSH: TSH: 1.482 u[IU]/mL (ref 0.350–4.500)

## 2020-08-16 LAB — FERRITIN: Ferritin: 812 ng/mL — ABNORMAL HIGH (ref 24–336)

## 2020-08-16 LAB — HEPARIN LEVEL (UNFRACTIONATED): Heparin Unfractionated: 0.16 IU/mL — ABNORMAL LOW (ref 0.30–0.70)

## 2020-08-16 LAB — FOLATE: Folate: 5.3 ng/mL — ABNORMAL LOW (ref >5.9)

## 2020-08-16 MED ORDER — APIXABAN 5 MG PO TABS
10.0000 mg | ORAL_TABLET | Freq: Two times a day (BID) | ORAL | Status: DC
  Administered 2020-08-16 – 2020-08-17 (×3): 10 mg via ORAL
  Filled 2020-08-16 (×3): qty 2

## 2020-08-16 MED ORDER — PERFLUTREN LIPID MICROSPHERE
1.0000 mL | INTRAVENOUS | Status: AC | PRN
  Administered 2020-08-16: 2 mL via INTRAVENOUS
  Filled 2020-08-16: qty 10

## 2020-08-16 MED ORDER — HEPARIN BOLUS VIA INFUSION
3000.0000 [IU] | Freq: Once | INTRAVENOUS | Status: AC
  Administered 2020-08-16: 3000 [IU] via INTRAVENOUS
  Filled 2020-08-16: qty 3000

## 2020-08-16 MED ORDER — FOLIC ACID 1 MG PO TABS
1.0000 mg | ORAL_TABLET | Freq: Every day | ORAL | Status: DC
  Administered 2020-08-16 – 2020-08-17 (×2): 1 mg via ORAL
  Filled 2020-08-16 (×2): qty 1

## 2020-08-16 MED ORDER — APIXABAN 5 MG PO TABS: 5.0000 mg | ORAL_TABLET | Freq: Two times a day (BID) | ORAL | Status: DC

## 2020-08-16 MED ORDER — CYANOCOBALAMIN 1000 MCG/ML IJ SOLN
1000.0000 ug | Freq: Every day | INTRAMUSCULAR | Status: DC
  Administered 2020-08-16 – 2020-08-17 (×2): 1000 ug via INTRAMUSCULAR
  Filled 2020-08-16 (×2): qty 1

## 2020-08-16 MED ORDER — HEPARIN (PORCINE) 25000 UT/250ML-% IV SOLN
1750.0000 [IU]/h | INTRAVENOUS | Status: DC
  Administered 2020-08-16 (×2): 1750 [IU]/h via INTRAVENOUS
  Filled 2020-08-16: qty 250

## 2020-08-16 MED ORDER — GUAIFENESIN-DM 100-10 MG/5ML PO SYRP
15.0000 mL | ORAL_SOLUTION | ORAL | Status: DC | PRN
  Administered 2020-08-16: 15 mL via ORAL
  Administered 2020-08-16: 10 mL via ORAL
  Administered 2020-08-17 (×2): 15 mL via ORAL
  Filled 2020-08-16 (×4): qty 15

## 2020-08-16 NOTE — Progress Notes (Signed)
Modified Barium Swallow Progress Note  Patient Details  Name: Joshua Bray MRN: [id number] Date of Birth: 01-29-1961  Today's Date: 08/16/2020  Modified Barium Swallow completed.  Full report located under Chart Review in the Imaging Section.  Brief recommendations include the following:  Clinical Impression  Overall pt presents with adequate oropharyngeal abilities with no penetration or aspiration of any consistencies assessed. Pt reports that his mouth continues to be very dry and he attributes mildly lengthy oral phase to that as well as missing molars. He does demonstrate mild oral phase deficits that results in decreased bolus cohesion with oral residue spills into pyriform sinuses post swallow. With cue for dry swallow, pt clears pharyngeal residue. At this time, recommend pt continue currently prescribed diet with thin liquids via cup or straw, medicines whole with thin liquids or whole with puree if his mouth is dry. All education completed.   Swallow Evaluation Recommendations       SLP Diet Recommendations: Regular solids;Thin liquid   Liquid Administration via: Cup;Straw   Medication Administration: Whole meds with liquid (or whole with puree)   Supervision: Patient able to self feed   Compensations: Minimize environmental distractions;Slow rate;Small sips/bites   Postural Changes: Seated upright at 90 degrees   Oral Care Recommendations: Oral care BID       Joshua Bray B. Rutherford Nail M.S., CCC-SLP, [place] Office [phone]  [name] Rutherford Nail 08/16/2020,12:37 PM

## 2020-08-16 NOTE — Progress Notes (Signed)
PROGRESS NOTE    Joshua Bray  JKK:[id number] DOB: 1960/05/24 DOA: 08/14/2020 PCP: Verl Bangs, FNP   Assessment & Plan:   Active Problems:   Acute pulmonary embolism (HCC)  Acute pulmonary embolism: d/c IV heparin and start eliquis. Echo ordered to r/o right heart strain. B/l LE [place] neg for DVT  Aspiration pneumonia: continue on IV rocephin, azithromycin. Pt is allergic to penicillins. Continue on bronchodilators and encourage incentive spirometry  Thrombocytopenia: etiology unclear. Will continue to monitor   Macrocytic anemia: secondary to vitamin B12 is on low end of normal. Will start B12 supplement. Folate is low so will start supplement   HTN: continue on amlodipine  DM2: likely poorly controlled. 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 Present & unchanged from original PET scan   Obesity: BMI 38.4. Complicates overall care and prognosis   DVT prophylaxis: eliquis Code Status: full  Family Communication:  Disposition Plan: likely d/c home  Level of care: Progressive Cardiac   Status is: Inpatient  Remains inpatient appropriate because:Ongoing diagnostic testing needed not appropriate for outpatient work up and Inpatient level of care appropriate due to severity of illness   Dispo: The patient is from: Home              Anticipated d/c is to: Home              Patient currently is not medically stable to d/c.   Difficult to place patient No        Consultants:   onco   Procedures:    Antimicrobials: rocephin, azithromycin    Subjective: Pt c/o chest pain w/ deep breaths intermittently   Objective: Vitals:   08/16/20 0113 08/16/20 0559 08/16/20 0600 08/16/20 0818  BP:  (!) 145/82  (!) 147/83  Pulse:  81  86  Resp:    18  Temp: 98.7 F (37.1 C) 98 F (36.7 C)  98.1 F (36.7 C)  TempSrc: Oral Oral  Oral  SpO2:  97%  98%  Weight:   120.4 kg   Height:        Intake/Output Summary (Last  24 hours) at 08/16/2020 0831 Last data filed at 08/16/2020 0618 Gross per 24 hour  Intake 1474.46 ml  Output 300 ml  Net 1174.46 ml   Filed Weights   08/15/20 0657 08/15/20 1217 08/16/20 0600  Weight: 107.7 kg 111.7 kg 120.4 kg    Examination:  General exam: Appears calm and comfortable  Respiratory system: diminished breath sounds b/l. Cardiovascular system: S1 & S2 +. No  rubs, gallops or clicks.  Gastrointestinal system: Abdomen is obese, soft and nontender. Normal bowel sounds heard. Central nervous system: Alert and oriented. Moves all extremities  Psychiatry: Judgement and insight appear normal. Mood & affect appropriate.     Data Reviewed: I have personally reviewed following labs and imaging studies  CBC: Recent Labs  Lab 08/11/20 1302 08/15/20 0006 08/15/20 0434 08/16/20 0424  WBC 4.1 4.8 4.5 3.8*  NEUTROABS 2.3 3.7  --   --   HGB 9.6* 10.0* 8.7* 7.6*  HCT 27.7* 30.1* 26.0* 23.2*  MCV 99.3 101.0* 101.2* 101.8*  PLT 164 164 141* 716*   Basic Metabolic Panel: Recent Labs  Lab 08/11/20 1302 08/15/20 0006 08/15/20 0434  NA 136 134* 134*  K 4.1 3.9 4.2  CL 101 100 100  CO2 25 26 28   GLUCOSE 122* 139* 137*  BUN 18 18 19   CREATININE 1.48*  1.27* 1.22  CALCIUM 8.8* 9.1 8.7*  MG 1.8  --   --    GFR: Estimated Creatinine Clearance: 85.9 mL/min (by C-G formula based on SCr of 1.22 mg/dL). Liver Function Tests: Recent Labs  Lab 08/11/20 1302 08/15/20 0006  AST 22 15  ALT 10 9  ALKPHOS 83 98  BILITOT 0.7 1.1  PROT 6.2* 7.2  ALBUMIN 3.2* 3.6   Recent Labs  Lab 08/15/20 0006  LIPASE 49   No results for input(s): AMMONIA in the last 168 hours. Coagulation Profile: Recent Labs  Lab 08/15/20 0236  INR 1.1   Cardiac Enzymes: No results for input(s): CKTOTAL, CKMB, CKMBINDEX, TROPONINI in the last 168 hours. BNP (last 3 results) No results for input(s): PROBNP in the last 8760 hours. HbA1C: No results for input(s): HGBA1C in the last 72  hours. CBG: Recent Labs  Lab 08/15/20 0909 08/15/20 1146 08/15/20 1637  GLUCAP 119* 157* 113*   Lipid Profile: No results for input(s): CHOL, HDL, LDLCALC, TRIG, CHOLHDL, LDLDIRECT in the last 72 hours. Thyroid Function Tests: Recent Labs    08/16/20 0424  TSH 1.482   Anemia Panel: Recent Labs    08/16/20 0424  FOLATE 5.3*  FERRITIN 812*  TIBC 181*  IRON 31*   Sepsis Labs: Recent Labs  Lab 08/15/20 0006 08/15/20 0236  PROCALCITON  --  0.10  LATICACIDVEN 1.2  --     Recent Results (from the past 240 hour(s))  SARS CORONAVIRUS 2 (TAT 6-24 HRS) Nasopharyngeal Nasopharyngeal Swab     Status: None   Collection Time: 08/15/20  2:36 AM   Specimen: Nasopharyngeal Swab  Result Value Ref Range Status   SARS Coronavirus 2 NEGATIVE NEGATIVE Final    Comment: (NOTE) SARS-CoV-2 target nucleic acids are NOT DETECTED.  The SARS-CoV-2 RNA is generally detectable in upper and lower respiratory specimens during the acute phase of infection. Negative results do not preclude SARS-CoV-2 infection, do not rule out co-infections with other pathogens, and should not be used as the sole basis for treatment or other patient management decisions. Negative results must be combined with clinical observations, patient history, and epidemiological information. The expected result is Negative.  Fact Sheet for Patients: SugarRoll.be  Fact Sheet for Healthcare Providers: https://www.woods-mathews.com/  This test is not yet approved or cleared by the [place] FDA and  has been authorized for detection and/or diagnosis of SARS-CoV-2 by FDA under an Emergency Use Authorization (EUA). This EUA will remain  in effect (meaning this test can be used) for the duration of the COVID-19 declaration under Se ction 564(b)(1) of the Act, 21 U.S.C. section 360bbb-3(b)(1), unless the authorization is terminated or revoked sooner.  Performed at [facility], [place] [address], [place], [place] [address]          Radiology Studies: CT Angio Chest PE W and/or Wo Contrast  Result Date: 08/15/2020 CLINICAL DATA:  Chest pain short of breath EXAM: CT ANGIOGRAPHY CHEST WITH CONTRAST TECHNIQUE: Multidetector CT imaging of the chest was performed using the standard protocol during bolus administration of intravenous contrast. Multiplanar CT image reconstructions and MIPs were obtained to evaluate the vascular anatomy. CONTRAST:  145mL OMNIPAQUE IOHEXOL 350 MG/ML SOLN COMPARISON:  PET CT 04/21/2020 FINDINGS: Cardiovascular: Satisfactory opacification of the pulmonary arteries to the segmental level. Acute filling defect within right middle lobe pulmonary artery with extension into segmental and subsegmental branches. Small acute emboli within right lower lobe segmental and subsegmental vessels. Acute left lower lobe subsegmental pulmonary  embolus. No evidence for right heart strain. Nonaneurysmal aorta without dissection. Mild aortic atherosclerosis. Coronary vascular calcification. Borderline to mild cardiomegaly. No pericardial effusion Mediastinum/Nodes: Midline trachea. No thyroid mass. Esophagus within normal limits. No suspicious adenopathy Lungs/Pleura: Trace left-sided pleural effusion. Partial consolidation and ground-glass density in the left lower lobe favored to represent pneumonia over infarct. Mild atelectasis right base. Upper Abdomen: No acute abnormality. Musculoskeletal: No chest wall abnormality. No acute or significant osseous findings. Review of the MIP images confirms the above findings. IMPRESSION: 1. Positive for acute bilateral pulmonary emboli as described above. No evidence for right heart strain. 2. Partial consolidation and ground-glass density in the left lower lobe favored to represent pneumonia or aspiration over infarct. Trace left-sided pleural effusion. 3. Aortic atherosclerosis. Critical Value/emergent results were called by  telephone at the time of interpretation on 08/15/2020 at 2:08 am to provider MARK QUALE , who verbally acknowledged these results. Aortic Atherosclerosis (ICD10-I70.0). Electronically Signed   By: Donavan Foil M.D.   On: 08/15/2020 02:08   CT ABDOMEN PELVIS W CONTRAST  Result Date: 08/15/2020 CLINICAL DATA:  Left upper quadrant pain EXAM: CT ABDOMEN AND PELVIS WITH CONTRAST TECHNIQUE: Multidetector CT imaging of the abdomen and pelvis was performed using the standard protocol following bolus administration of intravenous contrast. CONTRAST:  12mL OMNIPAQUE IOHEXOL 350 MG/ML SOLN COMPARISON:  None FINDINGS: Lower chest: Lung bases demonstrate small left pleural effusion. Airspace disease in the left lung base which may reflect atelectasis, pneumonia or aspiration. Mild atelectasis right base Hepatobiliary: No focal liver abnormality is seen. No gallstones, gallbladder wall thickening, or biliary dilatation. Pancreas: Unremarkable. No pancreatic ductal dilatation or surrounding inflammatory changes. Spleen: Normal in size without focal abnormality. Adrenals/Urinary Tract: Adrenal glands are unremarkable. Kidneys are normal, without renal calculi, focal lesion, or hydronephrosis. Bladder is unremarkable. Stomach/Bowel: Stomach is within normal limits. Appendix appears normal. No evidence of bowel wall thickening, distention, or inflammatory changes. Vascular/Lymphatic: Mild aortic atherosclerosis. No aneurysm. No suspicious nodes Reproductive: Prostate is unremarkable. Other: Negative for free air or free fluid. Fat within the bilateral inguinal canals. Musculoskeletal: Hardware with artifact in the left femur. Fat density mass superficial to the right paraspinal muscles with small internal vessels. There are small calcifications within. The mass measures approximately 10.8 by 4.2 by 14 cm. IMPRESSION: 1. No CT evidence for acute intra-abdominal or pelvic abnormality. 2. Small left pleural effusion. Airspace  disease in the left lung base which may reflect atelectasis, pneumonia or aspiration. 3. 10.8 x 4.2 x 14 cm fat density mass superficial to the right paraspinal muscles with small internal vessels and small calcifications within. The presence of enhancing of internal enhancing vessels is somewhat unusual; suggest lumbar MRI for further characterization of the mass. This may be performed on a nonemergent basis. Aortic Atherosclerosis (ICD10-I70.0). Electronically Signed   By: Donavan Foil M.D.   On: 08/15/2020 02:12   US Venous Img Lower Bilateral (DVT)  Result Date: 08/15/2020 CLINICAL DATA:  60 year old male with history of pulmonary embolism. EXAM: BILATERAL LOWER EXTREMITY VENOUS DOPPLER ULTRASOUND TECHNIQUE: Gray-scale sonography with graded compression, as well as color Doppler and duplex ultrasound were performed to evaluate the lower extremity deep venous systems from the level of the common femoral vein and including the common femoral, femoral, profunda femoral, popliteal and calf veins including the posterior tibial, peroneal and gastrocnemius veins when visible. The superficial great saphenous vein was also interrogated. Spectral Doppler was utilized to evaluate flow at rest and with distal augmentation maneuvers in the  common femoral, femoral and popliteal veins. COMPARISON:  None. FINDINGS: RIGHT LOWER EXTREMITY Common Femoral Vein: No evidence of thrombus. Normal compressibility, respiratory phasicity and response to augmentation. Saphenofemoral Junction: No evidence of thrombus. Normal compressibility and flow on color Doppler imaging. Profunda Femoral Vein: No evidence of thrombus. Normal compressibility and flow on color Doppler imaging. Femoral Vein: No evidence of thrombus. Normal compressibility, respiratory phasicity and response to augmentation. Popliteal Vein: No evidence of thrombus. Normal compressibility, respiratory phasicity and response to augmentation. Calf Veins: No evidence of  thrombus. Normal compressibility and flow on color Doppler imaging. Other Findings:  None. LEFT LOWER EXTREMITY Common Femoral Vein: No evidence of thrombus. Normal compressibility, respiratory phasicity and response to augmentation. Saphenofemoral Junction: No evidence of thrombus. Normal compressibility and flow on color Doppler imaging. Profunda Femoral Vein: No evidence of thrombus. Normal compressibility and flow on color Doppler imaging. Femoral Vein: No evidence of thrombus. Normal compressibility, respiratory phasicity and response to augmentation. Popliteal Vein: No evidence of thrombus. Normal compressibility, respiratory phasicity and response to augmentation. Calf Veins: No evidence of thrombus. Normal compressibility and flow on color Doppler imaging. Other Findings:  None. IMPRESSION: No evidence of bilateral extremity deep vein thrombosis. Ruthann Cancer, MD [facility] Electronically Signed   By: Ruthann Cancer MD   On: 08/15/2020 13:47   DG Chest Portable 1 View  Result Date: 08/15/2020 CLINICAL DATA:  Pleuritic chest pain EXAM: PORTABLE CHEST 1 VIEW COMPARISON:  PET CT 04/21/2020 FINDINGS: Right-sided central venous port tip over the SVC. Small left-sided pleural effusion with basilar airspace disease. Borderline cardiomegaly. No pneumothorax IMPRESSION: Small left pleural effusion with basilar airspace disease, atelectasis or pneumonia. Electronically Signed   By: Donavan Foil M.D.   On: 08/15/2020 00:12        Scheduled Meds: . guaiFENesin  600 mg Oral BID   Continuous Infusions: . sodium chloride 100 mL/hr at 08/16/20 0110  . azithromycin 500 mg (08/16/20 0741)  . cefTRIAXone (ROCEPHIN)  IV 2 g (08/16/20 0429)  . heparin 1,750 Units/hr (08/16/20 1610)     LOS: 1 day    Time spent: 33 mins     Wyvonnia Dusky, MD [facility] Pager [phone]  If 7PM-7AM, please contact night-coverage 08/16/2020,  8:31 AM

## 2020-08-16 NOTE — Evaluation (Signed)
Clinical/Bedside Swallow Evaluation Patient Details  Name: Joshua Bray MRN: [id number] Date of Birth: 1961-03-27  Today's Date: 08/16/2020 Time: SLP Start Time (ACUTE ONLY): 0910 SLP Stop Time (ACUTE ONLY): 0935 SLP Time Calculation (min) (ACUTE ONLY): 25 min  Past Medical History:  Past Medical History:  Diagnosis Date  . Diabetes mellitus without complication ([place])   . Hypertension   . Mucositis    Past Surgical History:  Past Surgical History:  Procedure Laterality Date  . FEMUR SURGERY Left 1983  . IR IMAGING GUIDED PORT INSERTION  04/16/2020   HPI:  Joshua Bray is a 60 y.o. 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 Rogue Bussing, presented to the emergency room with acute onset of left lower rib and upper quadrant abdominal pain with radiation to his back as well as exacerbation by deep breathing.  Chest CTA was positive for acute bilateral pulmonary emboli and partial consolidation and ground-glass density in the left lower lobe facored to represent pneumonia or aspiration over infarct.   Assessment / Plan / Recommendation Clinical Impression  At bedside pt presents with adequate oropharyngeal abilities when consuming thin liquids via cup and moistened soft solids. Per chart, pt has just started consuming more POs post chemo-radiation. Given pt's hx of base of tongue cancer, radiation to neck, rather large neck mass and pneumonia, will error on the side of caution and recommend a Modified Barium Swallow Study to further assess oropharyngeal function and possibility of silent aspiration. Pt voiced understanding. SLP Visit Diagnosis: Dysphagia, unspecified (R13.10)    Aspiration Risk  Mild aspiration risk    Diet Recommendation Regular;Thin liquid   Liquid Administration via:  Cup Medication Administration: Whole meds with liquid Supervision: Patient able to self feed Compensations: Minimize environmental distractions;Slow rate;Small sips/bites Postural Changes: Seated upright at 90 degrees;Remain upright for at least 30 minutes after po intake    Other  Recommendations Oral Care Recommendations: Oral care BID   Follow up Recommendations None      Frequency and Duration min 1 x/week  1 week       Prognosis Prognosis for Safe Diet Advancement: Good Barriers to Reach Goals:  (hx tongue and neck radiaiton)      Swallow Study   General Date of Onset: 08/15/20 HPI: Joshua Bray is a 60 y.o. 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 Rogue Bussing, presented to the emergency room with acute onset of left lower rib and upper quadrant abdominal pain with radiation to his back as well as exacerbation by deep breathing.  Chest CTA was positive for acute bilateral pulmonary emboli and partial consolidation and ground-glass density in the left lower lobe facored to represent pneumonia or aspiration over infarct. Type of Study: Bedside Swallow Evaluation Previous Swallow Assessment: none in chart Diet Prior to this Study: Regular;Thin liquids Temperature Spikes Noted: Yes Respiratory Status: Room air History of Recent Intubation: No Behavior/Cognition: Alert;Cooperative;Pleasant mood Oral Cavity Assessment: Within Functional Limits Oral Care Completed by SLP: No Oral Cavity - Dentition: Adequate natural dentition;Missing dentition (missing molars) Vision: Functional for self-feeding Self-Feeding Abilities: Able to feed self Patient Positioning: Upright in bed Baseline Vocal Quality: Normal Volitional Cough: Strong Volitional Swallow: Able to elicit  Oral/Motor/Sensory  Function Overall Oral Motor/Sensory Function: Within functional limits   Ice Chips Ice chips: Not tested   Thin Liquid Thin Liquid: Within functional limits Presentation: Cup;Self Fed    Nectar Thick Nectar Thick Liquid: Not tested   Honey Thick Honey Thick Liquid: Not tested   Puree Puree: Within functional limits Presentation: Self Fed;Spoon   Solid     Solid: Within functional limits (moisten soft solids d/t missing molars) Presentation: Self Fed     Joshua Bray M.S., CCC-SLP, [place] Office [phone]  Joshua Bray Rutherford Bray 08/16/2020,11:11 AM

## 2020-08-16 NOTE — Progress Notes (Signed)
*  PRELIMINARY RESULTS* Echocardiogram 2D Echocardiogram has been performed.  Joshua Bray Current 08/16/2020, 1:56 PM

## 2020-08-16 NOTE — Progress Notes (Signed)
Joshua Bray   DOB:12-11-1960   ZD#:[id number]    Subjective: Patient also improvement of his pleuritic chest pain.  Denies abdominal pain.  Any nausea vomiting.  No blood in stools or black or stools.  Not taking oxygen.  Has been walking the bathroom.  Objective:  Vitals:   08/16/20 0559 08/16/20 0818  BP: (!) 145/82 (!) 147/83  Pulse: 81 86  Resp:  18  Temp: 98 F (36.7 C) 98.1 F (36.7 C)  SpO2: 97% 98%     Intake/Output Summary (Last 24 hours) at 08/16/2020 0956 Last data filed at 08/16/2020 0618 Gross per 24 hour  Intake 1474.46 ml  Output 300 ml  Net 1174.46 ml    Physical Exam HENT:     Head: Normocephalic and atraumatic.     Mouth/Throat:     Pharynx: No oropharyngeal exudate.  Eyes:     Pupils: Pupils are equal, round, and reactive to light.  Neck:     Comments: Left neck lymphadenopathy compared to right. Cardiovascular:     Rate and Rhythm: Normal rate and regular rhythm.  Pulmonary:     Effort: No respiratory distress.     Breath sounds: No wheezing.     Comments: Decreased air entry at the bases bilaterally.  No wheeze or crackles Abdominal:     General: Bowel sounds are normal. There is no distension.     Palpations: Abdomen is soft. There is no mass.     Tenderness: There is no abdominal tenderness. There is no guarding or rebound.  Musculoskeletal:        General: No tenderness. Normal range of motion.     Cervical back: Normal range of motion and neck supple.  Skin:    General: Skin is warm.  Neurological:     Mental Status: He is alert and oriented to person, place, and time.  Psychiatric:        Mood and Affect: Affect normal.      Labs:  Lab Results  Component Value Date   WBC 3.8 (L) 08/16/2020   HGB 7.6 (L) 08/16/2020   HCT 23.2 (L) 08/16/2020   MCV 101.8 (H) 08/16/2020   PLT 135 (L) 08/16/2020   NEUTROABS 3.7 08/15/2020    Lab Results  Component Value Date   NA 134 (L) 08/15/2020   K 4.2 08/15/2020   CL 100 08/15/2020   CO2  28 08/15/2020    Studies:  CT Angio Chest PE W and/or Wo Contrast  Result Date: 08/15/2020 CLINICAL DATA:  Chest pain short of breath EXAM: CT ANGIOGRAPHY CHEST WITH CONTRAST TECHNIQUE: Multidetector CT imaging of the chest was performed using the standard protocol during bolus administration of intravenous contrast. Multiplanar CT image reconstructions and MIPs were obtained to evaluate the vascular anatomy. CONTRAST:  143mL OMNIPAQUE IOHEXOL 350 MG/ML SOLN COMPARISON:  PET CT 04/21/2020 FINDINGS: Cardiovascular: Satisfactory opacification of the pulmonary arteries to the segmental level. Acute filling defect within right middle lobe pulmonary artery with extension into segmental and subsegmental branches. Small acute emboli within right lower lobe segmental and subsegmental vessels. Acute left lower lobe subsegmental pulmonary embolus. No evidence for right heart strain. Nonaneurysmal aorta without dissection. Mild aortic atherosclerosis. Coronary vascular calcification. Borderline to mild cardiomegaly. No pericardial effusion Mediastinum/Nodes: Midline trachea. No thyroid mass. Esophagus within normal limits. No suspicious adenopathy Lungs/Pleura: Trace left-sided pleural effusion. Partial consolidation and ground-glass density in the left lower lobe favored to represent pneumonia over infarct. Mild atelectasis right base. Upper Abdomen: No acute  abnormality. Musculoskeletal: No chest wall abnormality. No acute or significant osseous findings. Review of the MIP images confirms the above findings. IMPRESSION: 1. Positive for acute bilateral pulmonary emboli as described above. No evidence for right heart strain. 2. Partial consolidation and ground-glass density in the left lower lobe favored to represent pneumonia or aspiration over infarct. Trace left-sided pleural effusion. 3. Aortic atherosclerosis. Critical Value/emergent results were called by telephone at the time of interpretation on 08/15/2020 at 2:08  am to provider MARK QUALE , who verbally acknowledged these results. Aortic Atherosclerosis (ICD10-I70.0). Electronically Signed   By: Donavan Foil M.D.   On: 08/15/2020 02:08   CT ABDOMEN PELVIS W CONTRAST  Result Date: 08/15/2020 CLINICAL DATA:  Left upper quadrant pain EXAM: CT ABDOMEN AND PELVIS WITH CONTRAST TECHNIQUE: Multidetector CT imaging of the abdomen and pelvis was performed using the standard protocol following bolus administration of intravenous contrast. CONTRAST:  11mL OMNIPAQUE IOHEXOL 350 MG/ML SOLN COMPARISON:  None FINDINGS: Lower chest: Lung bases demonstrate small left pleural effusion. Airspace disease in the left lung base which may reflect atelectasis, pneumonia or aspiration. Mild atelectasis right base Hepatobiliary: No focal liver abnormality is seen. No gallstones, gallbladder wall thickening, or biliary dilatation. Pancreas: Unremarkable. No pancreatic ductal dilatation or surrounding inflammatory changes. Spleen: Normal in size without focal abnormality. Adrenals/Urinary Tract: Adrenal glands are unremarkable. Kidneys are normal, without renal calculi, focal lesion, or hydronephrosis. Bladder is unremarkable. Stomach/Bowel: Stomach is within normal limits. Appendix appears normal. No evidence of bowel wall thickening, distention, or inflammatory changes. Vascular/Lymphatic: Mild aortic atherosclerosis. No aneurysm. No suspicious nodes Reproductive: Prostate is unremarkable. Other: Negative for free air or free fluid. Fat within the bilateral inguinal canals. Musculoskeletal: Hardware with artifact in the left femur. Fat density mass superficial to the right paraspinal muscles with small internal vessels. There are small calcifications within. The mass measures approximately 10.8 by 4.2 by 14 cm. IMPRESSION: 1. No CT evidence for acute intra-abdominal or pelvic abnormality. 2. Small left pleural effusion. Airspace disease in the left lung base which may reflect atelectasis,  pneumonia or aspiration. 3. 10.8 x 4.2 x 14 cm fat density mass superficial to the right paraspinal muscles with small internal vessels and small calcifications within. The presence of enhancing of internal enhancing vessels is somewhat unusual; suggest lumbar MRI for further characterization of the mass. This may be performed on a nonemergent basis. Aortic Atherosclerosis (ICD10-I70.0). Electronically Signed   By: Donavan Foil M.D.   On: 08/15/2020 02:12   US Venous Img Lower Bilateral (DVT)  Result Date: 08/15/2020 CLINICAL DATA:  60 year old male with history of pulmonary embolism. EXAM: BILATERAL LOWER EXTREMITY VENOUS DOPPLER ULTRASOUND TECHNIQUE: Gray-scale sonography with graded compression, as well as color Doppler and duplex ultrasound were performed to evaluate the lower extremity deep venous systems from the level of the common femoral vein and including the common femoral, femoral, profunda femoral, popliteal and calf veins including the posterior tibial, peroneal and gastrocnemius veins when visible. The superficial great saphenous vein was also interrogated. Spectral Doppler was utilized to evaluate flow at rest and with distal augmentation maneuvers in the common femoral, femoral and popliteal veins. COMPARISON:  None. FINDINGS: RIGHT LOWER EXTREMITY Common Femoral Vein: No evidence of thrombus. Normal compressibility, respiratory phasicity and response to augmentation. Saphenofemoral Junction: No evidence of thrombus. Normal compressibility and flow on color Doppler imaging. Profunda Femoral Vein: No evidence of thrombus. Normal compressibility and flow on color Doppler imaging. Femoral Vein: No evidence of thrombus. Normal compressibility, respiratory  phasicity and response to augmentation. Popliteal Vein: No evidence of thrombus. Normal compressibility, respiratory phasicity and response to augmentation. Calf Veins: No evidence of thrombus. Normal compressibility and flow on color Doppler  imaging. Other Findings:  None. LEFT LOWER EXTREMITY Common Femoral Vein: No evidence of thrombus. Normal compressibility, respiratory phasicity and response to augmentation. Saphenofemoral Junction: No evidence of thrombus. Normal compressibility and flow on color Doppler imaging. Profunda Femoral Vein: No evidence of thrombus. Normal compressibility and flow on color Doppler imaging. Femoral Vein: No evidence of thrombus. Normal compressibility, respiratory phasicity and response to augmentation. Popliteal Vein: No evidence of thrombus. Normal compressibility, respiratory phasicity and response to augmentation. Calf Veins: No evidence of thrombus. Normal compressibility and flow on color Doppler imaging. Other Findings:  None. IMPRESSION: No evidence of bilateral extremity deep vein thrombosis. Ruthann Cancer, MD [facility] Electronically Signed   By: Ruthann Cancer MD   On: 08/15/2020 13:47   DG Chest Portable 1 View  Result Date: 08/15/2020 CLINICAL DATA:  Pleuritic chest pain EXAM: PORTABLE CHEST 1 VIEW COMPARISON:  PET CT 04/21/2020 FINDINGS: Right-sided central venous port tip over the SVC. Small left-sided pleural effusion with basilar airspace disease. Borderline cardiomegaly. No pneumothorax IMPRESSION: Small left pleural effusion with basilar airspace disease, atelectasis or pneumonia. Electronically Signed   By: Donavan Foil M.D.   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 S/p evaluation with Dr.Bennett; awaiting evaluation with tertiary center for possible surgery for respiratory disease.  Awaiting to order PET  scan first week of May 2022.  #Acute bilateral PE-likely secondary to chemotherapy/immobility-agree with IV heparin clinically improved.  Recommend switching over to Eliquis 10 mg twice daily for 1 week followed by 5 mg Eliquis daily.  #Anemia hemoglobin 7.6-no active signs of bleeding; suspect multifactorial/recent chemotherapy-monitor closely.  #Recent dehydration/acute renal failure-secondary cisplatin-currently creatinine improved 1.2.  Continue holding nephrotoxic agent/antihypertensives.    #Right parasternal mass with calcifications-incidentally noted on abdominal CT scan-PET negative suspect benign lesion.  We will plan MRI as recommended on non- emergency basis.  #The above plan of care was discussed with patient in detail.  Also discussed with Dr. Jimmye Norman.  Patient will plan to follow-up with me in the clinic on 4/13 as planned.       Cammie Sickle, MD 08/16/2020  9:56 AM

## 2020-08-16 NOTE — Assessment & Plan Note (Signed)
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 S/p evaluation with Dr.Bennett; awaiting evaluation with tertiary center for possible surgery for respiratory disease.  Awaiting to order PET scan first week of May 2022.  #Acute bilateral PE-likely secondary to chemotherapy/immobility-agree with IV heparin clinically improved.  Recommend switching over to Eliquis 10 mg twice daily for 1 week followed by 5 mg Eliquis daily.  #Anemia hemoglobin 7.6-no active signs of bleeding; suspect multifactorial/recent chemotherapy-monitor closely.  #Recent dehydration/acute renal failure-secondary cisplatin-currently creatinine improved 1.2.  Continue holding nephrotoxic agent/antihypertensives.    #Right parasternal mass with calcifications-incidentally noted on abdominal CT scan-PET negative suspect benign lesion.  We will plan MRI as recommended on non- emergency basis.  #The above plan of care was discussed with patient in detail.  Also discussed with Dr. Jimmye Norman.  Patient will plan to follow-up with me in the clinic on 4/13 as planned.

## 2020-08-16 NOTE — Progress Notes (Signed)
IV started by bedside RN per flowsheets.

## 2020-08-16 NOTE — Progress Notes (Signed)
[place] for heparin infusion Indication: pulmonary embolus  Allergies  Allergen Reactions  . Penicillins     Unknown, allergy from an infant    Patient Measurements: Height: 5\' 10"  (177.8 cm) Weight: 111.7 kg (246 lb 4.8 oz) IBW/kg (Calculated) : 73 Heparin Dosing Weight: 100.6 kg  Vital Signs: Temp: 98.7 F (37.1 C) (04/11 0113) Temp Source: Oral (04/11 0113) BP: 127/77 (04/10 2239) Pulse Rate: 101 (04/10 2239)  Labs: Recent Labs    08/15/20 0006 08/15/20 0140 08/15/20 0236 08/15/20 0434 08/15/20 0830 08/15/20 1453 08/16/20 0424  HGB 10.0*  --   --  8.7*  --   --  7.6*  HCT 30.1*  --   --  26.0*  --   --  23.2*  PLT 164  --   --  141*  --   --  135*  APTT  --   --  36  --   --   --   --   LABPROT  --   --  13.8  --   --   --   --   INR  --   --  1.1  --   --   --   --   HEPARINUNFRC  --   --   --   --  0.39 0.34 0.16*  CREATININE 1.27*  --   --  1.22  --   --   --   TROPONINIHS 9 8  --   --   --   --   --     Estimated Creatinine Clearance: 82.6 mL/min (by C-G formula based on SCr of 1.22 mg/dL).   Medical History: Past Medical History:  Diagnosis Date  . Diabetes mellitus without complication ([place])   . Hypertension   . Mucositis     Assessment: Pt is 60 yo male presenting to ER due to worsening/radiating left sided chest pain.  CT Chest: "Positive for acute bilateral pulmonary emboli."  4/10 0830 HL 0.39  4/10 1453 HL 0.34, therapeutic x 2 4/11 0424 HL 0.16, subtherapeutic  Goal of Therapy:  Heparin level 0.3-0.7 units/ml Monitor platelets by anticoagulation protocol: Yes   Plan:  Give 3000 units bolus x 1.  Will increase infusion rate to  1750 units/hr. Recheck heparin level in 6 hours. CBC daily while on heparin.   Renda Rolls, PharmD, [facility] 08/16/2020 5:43 AM

## 2020-08-17 DIAGNOSIS — D539 Nutritional anemia, unspecified: Secondary | ICD-10-CM

## 2020-08-17 LAB — BASIC METABOLIC PANEL
Anion gap: 9 (ref 5–15)
BUN: 9 mg/dL (ref 6–20)
CO2: 26 mmol/L (ref 22–32)
Calcium: 8.7 mg/dL — ABNORMAL LOW (ref 8.9–10.3)
Chloride: 100 mmol/L (ref 98–111)
Creatinine, Ser: 0.98 mg/dL (ref 0.61–1.24)
GFR, Estimated: >60 mL/min (ref >60)
Glucose, Bld: 111 mg/dL — ABNORMAL HIGH (ref 70–99)
Potassium: 3.9 mmol/L (ref 3.5–5.1)
Sodium: 135 mmol/L (ref 135–145)

## 2020-08-17 LAB — BETA-2-GLYCOPROTEIN I ABS, IGG/M/A
Beta-2 Glyco I IgG: <9 GPI IgG units (ref 0–20)
Beta-2-Glycoprotein I IgA: <9 GPI IgA units (ref 0–25)
Beta-2-Glycoprotein I IgM: <9 GPI IgM units (ref 0–32)

## 2020-08-17 LAB — CBC
HCT: 25.1 % — ABNORMAL LOW (ref 39.0–52.0)
Hemoglobin: 8.5 g/dL — ABNORMAL LOW (ref 13.0–17.0)
MCH: 34.3 pg — ABNORMAL HIGH (ref 26.0–34.0)
MCHC: 33.9 g/dL (ref 30.0–36.0)
MCV: 101.2 fL — ABNORMAL HIGH (ref 80.0–100.0)
Platelets: 176 10*3/uL (ref 150–400)
RBC: 2.48 MIL/uL — ABNORMAL LOW (ref 4.22–5.81)
RDW: 17.5 % — ABNORMAL HIGH (ref 11.5–15.5)
WBC: 3.6 10*3/uL — ABNORMAL LOW (ref 4.0–10.5)
nRBC: 0 % (ref 0.0–0.2)

## 2020-08-17 LAB — MAGNESIUM: Magnesium: 1.9 mg/dL (ref 1.7–2.4)

## 2020-08-17 MED ORDER — APIXABAN 5 MG PO TABS: 5.0000 mg | ORAL_TABLET | Freq: Two times a day (BID) | ORAL | 0 refills | Status: DC

## 2020-08-17 MED ORDER — APIXABAN 5 MG PO TABS: 10.0000 mg | ORAL_TABLET | Freq: Two times a day (BID) | ORAL | 0 refills | Status: DC

## 2020-08-17 MED ORDER — CYANOCOBALAMIN 500 MCG PO TABS: 500.0000 ug | ORAL_TABLET | Freq: Every day | ORAL | 0 refills | Status: AC

## 2020-08-17 MED ORDER — AZITHROMYCIN 250 MG PO TABS: 250.0000 mg | ORAL_TABLET | Freq: Every day | ORAL | 0 refills | Status: AC

## 2020-08-17 MED ORDER — FOLIC ACID 1 MG PO TABS: 1.0000 mg | ORAL_TABLET | Freq: Every day | ORAL | 0 refills | Status: AC

## 2020-08-17 MED FILL — AZITHROMYCIN 250 MG TABLET: 2 days supply | Qty: 2 | Fill #0

## 2020-08-17 MED FILL — FOLIC ACID 1 MG TABLET: 30 days supply | Qty: 30 | Fill #0

## 2020-08-17 MED FILL — ELIQUIS 5 MG TABLET: 12 days supply | Qty: 24 | Fill #0

## 2020-08-17 NOTE — Progress Notes (Signed)
Joshua Bray   DOB:25-Jan-1961   XV#:[id number]    Subjective: No acute events overnight.  No nausea vomiting.  No blood in stools or black or stools.  Objective:  There were no vitals filed for this visit.  No intake or output data in the 24 hours ending 08/17/20 2119  Physical Exam HENT:     Head: Normocephalic and atraumatic.     Mouth/Throat:     Pharynx: No oropharyngeal exudate.  Eyes:     Pupils: Pupils are equal, round, and reactive to light.  Neck:     Comments: Positive for neck adenopathy left more than right.  Cardiovascular:     Rate and Rhythm: Normal rate and regular rhythm.  Pulmonary:     Effort: No respiratory distress.     Breath sounds: No wheezing.     Comments: Decreased breath sounds at the bases. Abdominal:     General: Bowel sounds are normal. There is no distension.     Palpations: Abdomen is soft. There is no mass.     Tenderness: There is no abdominal tenderness. There is no guarding or rebound.  Musculoskeletal:        General: No tenderness. Normal range of motion.     Cervical back: Normal range of motion and neck supple.  Skin:    General: Skin is warm.  Neurological:     Mental Status: He is alert and oriented to person, place, and time.  Psychiatric:        Mood and Affect: Affect normal.      Labs:  Lab Results  Component Value Date   WBC 3.6 (L) 08/17/2020   HGB 8.5 (L) 08/17/2020   HCT 25.1 (L) 08/17/2020   MCV 101.2 (H) 08/17/2020   PLT 176 08/17/2020   NEUTROABS 3.7 08/15/2020    Lab Results  Component Value Date   NA 135 08/17/2020   K 3.9 08/17/2020   CL 100 08/17/2020   CO2 26 08/17/2020    Studies:  DG Swallowing Func-Speech Pathology  Result Date: 08/16/2020 Objective Swallowing Evaluation: Type of Study: MBS-Modified Barium Swallow Study  Patient Details Name: Joshua Bray MRN: [id number] Date of Birth: 1960-10-21 Today's Date: 08/16/2020 Time: SLP Start Time (ACUTE ONLY): 1120 -SLP Stop Time (ACUTE ONLY): 1140 SLP  Time Calculation (min) (ACUTE ONLY): 20 min Past Medical History: Past Medical History: Diagnosis Date . Diabetes mellitus without complication ([place])  . Hypertension  . Mucositis  Past Surgical History: Past Surgical History: Procedure Laterality Date . FEMUR SURGERY Left 1983 . IR IMAGING GUIDED PORT INSERTION  04/16/2020 HPI: Joshua Bray is a 60 y.o. 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 Rogue Bussing, presented to the emergency room with acute onset of left lower rib and upper quadrant abdominal pain with radiation to his back as well as exacerbation by deep breathing.  Chest CTA was positive for acute bilateral pulmonary emboli and partial consolidation and ground-glass density in the left lower lobe facored to represent pneumonia or aspiration over infarct.  Subjective: pt pleasant, appears to be good historian Assessment / Plan / Recommendation CHL IP CLINICAL IMPRESSIONS 08/16/2020 Clinical Impression Overall pt presents with adequate oropharyngeal abilities with no penetration or aspiration of any consistencies assessed. Pt reports that his mouth continues to be very dry and he attributes mildly lengthy oral phase to that as well  as missing molars. He does demonstrate mild oral phase deficits that results in decreased bolus cohesion with oral residue spills into pyriform sinuses post swallow. With cue for dry swallow, pt clears pharyngeal residue. At this time, recommend pt continue currently prescribed diet with thin liquids via cup or straw, medicines whole with thin liquids or whole with puree if his mouth is dry. All education completed. SLP Visit Diagnosis Dysphagia, unspecified (R13.10) Attention and concentration deficit following -- Frontal lobe and executive function deficit following -- Impact on safety and  function Mild aspiration risk   CHL IP TREATMENT RECOMMENDATION 08/16/2020 Treatment Recommendations No treatment recommended at this time   Prognosis 08/16/2020 Prognosis for Safe Diet Advancement Good Barriers to Reach Goals (No Data) Barriers/Prognosis Comment -- CHL IP DIET RECOMMENDATION 08/16/2020 SLP Diet Recommendations Regular solids;Thin liquid Liquid Administration via Cup;Straw Medication Administration Whole meds with liquid Compensations Minimize environmental distractions;Slow rate;Small sips/bites Postural Changes Seated upright at 90 degrees   CHL IP OTHER RECOMMENDATIONS 08/16/2020 Recommended Consults -- Oral Care Recommendations Oral care BID Other Recommendations --   CHL IP FOLLOW UP RECOMMENDATIONS 08/16/2020 Follow up Recommendations None   CHL IP FREQUENCY AND DURATION 08/16/2020 Speech Therapy Frequency (ACUTE ONLY) min 1 x/week Treatment Duration 1 week      CHL IP ORAL PHASE 08/16/2020 Oral Phase Impaired Oral - Pudding Teaspoon -- Oral - Pudding Cup -- Oral - Honey Teaspoon -- Oral - Honey Cup -- Oral - Nectar Teaspoon -- Oral - Nectar Cup -- Oral - Nectar Straw -- Oral - Thin Teaspoon -- Oral - Thin Cup Weak lingual manipulation;Delayed oral transit;Lingual/palatal residue;Decreased bolus cohesion Oral - Thin Straw Weak lingual manipulation;Reduced posterior propulsion;Delayed oral transit;Decreased bolus cohesion Oral - Puree Weak lingual manipulation;Reduced posterior propulsion;Delayed oral transit;Decreased bolus cohesion Oral - Mech Soft Weak lingual manipulation;Decreased bolus cohesion;Premature spillage;Delayed oral transit;Reduced posterior propulsion Oral - Regular -- Oral - Multi-Consistency -- Oral - Pill (No Data) Oral Phase - Comment --  CHL IP PHARYNGEAL PHASE 08/16/2020 Pharyngeal Phase Impaired Pharyngeal- Pudding Teaspoon -- Pharyngeal -- Pharyngeal- Pudding Cup -- Pharyngeal -- Pharyngeal- Honey Teaspoon -- Pharyngeal -- Pharyngeal- Honey Cup -- Pharyngeal -- Pharyngeal-  Nectar Teaspoon -- Pharyngeal -- Pharyngeal- Nectar Cup -- Pharyngeal -- Pharyngeal- Nectar Straw -- Pharyngeal -- Pharyngeal- Thin Teaspoon -- Pharyngeal -- Pharyngeal- Thin Cup Delayed swallow initiation-vallecula;Pharyngeal residue - pyriform Pharyngeal -- Pharyngeal- Thin Straw Delayed swallow initiation-vallecula;Pharyngeal residue - pyriform Pharyngeal -- Pharyngeal- Puree Delayed swallow initiation-pyriform sinuses;Delayed swallow initiation-vallecula;Pharyngeal residue - pyriform Pharyngeal -- Pharyngeal- Mechanical Soft Delayed swallow initiation-vallecula;Pharyngeal residue - pyriform;Pharyngeal residue - valleculae Pharyngeal -- Pharyngeal- Regular -- Pharyngeal -- Pharyngeal- Multi-consistency -- Pharyngeal -- Pharyngeal- Pill Pharyngeal residue - valleculae Pharyngeal -- Pharyngeal Comment --  CHL IP CERVICAL ESOPHAGEAL PHASE 08/16/2020 Cervical Esophageal Phase WFL Pudding Teaspoon -- Pudding Cup -- Honey Teaspoon -- Honey Cup -- Nectar Teaspoon -- Nectar Cup -- Nectar Straw -- Thin Teaspoon -- Thin Cup -- Thin Straw -- Puree -- Mechanical Soft -- Regular -- Multi-consistency -- Pill -- Cervical Esophageal Comment -- Happi Overton 08/16/2020, 12:38 PM              ECHOCARDIOGRAM COMPLETE  Result Date: 08/16/2020    ECHOCARDIOGRAM REPORT   Patient Name:   Joshua Bray Date of Exam: 08/16/2020 Medical Rec #:  [id number]     Height:       70.0 in Accession #:    [id number]    Weight:       268.1 lb Date of  Birth:  Apr 22, 1961     BSA:          2.364 m Patient Age:    60 years      BP:           147/83 mmHg Patient Gender: M             HR:           82 bpm. Exam Location:  [facility] Procedure: 2D Echo, Color Doppler, Cardiac Doppler and Intracardiac            Opacification Agent Indications:     I26.09 Pulmonary Embolus  History:         Patient has no prior history of Echocardiogram examinations.                  Risk Factors:Hypertension and Diabetes.  Sonographer:     Charmayne Sheer RDCS (AE) Referring Phys:   [id number] Arvella Merles MANSY Diagnosing Phys: Harrell Gave End MD  Sonographer Comments: Suboptimal apical window and suboptimal subcostal window. IMPRESSIONS  1. Left ventricular ejection fraction, by estimation, is 65 to 70%. The left ventricle has normal function. The left ventricle has no regional wall motion abnormalities. There is mild left ventricular hypertrophy. Left ventricular diastolic parameters were normal.  2. Right ventricular systolic function is normal. The right ventricular size is mildly enlarged. Tricuspid regurgitation signal is inadequate for assessing PA pressure.  3. The mitral valve is normal in structure. Trivial mitral valve regurgitation. No evidence of mitral stenosis.  4. The aortic valve is tricuspid. Aortic valve regurgitation is not visualized. No aortic stenosis is present.  5. The inferior vena cava is normal in size with greater than 50% respiratory variability, suggesting right atrial pressure of 3 mmHg. FINDINGS  Left Ventricle: Left ventricular ejection fraction, by estimation, is 65 to 70%. The left ventricle has normal function. The left ventricle has no regional wall motion abnormalities. Definity contrast agent was given IV to delineate the left ventricular  endocardial borders. The left ventricular internal cavity size was normal in size. There is mild left ventricular hypertrophy. Left ventricular diastolic parameters were normal. Right Ventricle: The right ventricular size is mildly enlarged. No increase in right ventricular wall thickness. Right ventricular systolic function is normal. Tricuspid regurgitation signal is inadequate for assessing PA pressure. Left Atrium: Left atrial size was normal in size. Right Atrium: Right atrial size was normal in size. Pericardium: There is no evidence of pericardial effusion. Mitral Valve: The mitral valve is normal in structure. Trivial mitral valve regurgitation. No evidence of mitral valve stenosis. MV peak gradient, 3.5 mmHg. The mean  mitral valve gradient is 2.0 mmHg. Tricuspid Valve: The tricuspid valve is normal in structure. Tricuspid valve regurgitation is trivial. Aortic Valve: The aortic valve is tricuspid. Aortic valve regurgitation is not visualized. No aortic stenosis is present. Aortic valve mean gradient measures 6.0 mmHg. Aortic valve peak gradient measures 8.4 mmHg. Aortic valve area, by VTI measures 2.96 cm. Pulmonic Valve: The pulmonic valve was not well visualized. Pulmonic valve regurgitation is not visualized. No evidence of pulmonic stenosis. Aorta: The aortic root is normal in size and structure. Pulmonary Artery: The pulmonary artery is not well seen. Venous: The inferior vena cava is normal in size with greater than 50% respiratory variability, suggesting right atrial pressure of 3 mmHg. IAS/Shunts: The interatrial septum was not well visualized.  LEFT VENTRICLE PLAX 2D LVIDd:         4.40 cm  Diastology LVIDs:  2.70 cm  LV e' medial:    9.57 cm/s LV PW:         1.20 cm  LV E/e' medial:  9.4 LV IVS:        1.14 cm  LV e' lateral:   8.92 cm/s LVOT diam:     2.20 cm  LV E/e' lateral: 10.0 LV SV:         81 LV SV Index:   34 LVOT Area:     3.80 cm  RIGHT VENTRICLE RV Basal diam:  5.20 cm LEFT ATRIUM             Index       RIGHT ATRIUM           Index LA diam:        3.10 cm 1.31 cm/m  RA Area:     11.40 cm LA Vol (A2C):   73.8 ml 31.22 ml/m RA Volume:   22.60 ml  9.56 ml/m LA Vol (A4C):   57.1 ml 24.15 ml/m LA Biplane Vol: 67.3 ml 28.47 ml/m  AORTIC VALVE                    PULMONIC VALVE AV Area (Vmax):    3.04 cm     PV Vmax:       1.16 m/s AV Area (Vmean):   2.71 cm     PV Vmean:      76.700 cm/s AV Area (VTI):     2.96 cm     PV VTI:        0.177 m AV Vmax:           145.00 cm/s  PV Peak grad:  5.4 mmHg AV Vmean:          117.000 cm/s PV Mean grad:  3.0 mmHg AV VTI:            0.275 m AV Peak Grad:      8.4 mmHg AV Mean Grad:      6.0 mmHg LVOT Vmax:         116.00 cm/s LVOT Vmean:        83.300 cm/s  LVOT VTI:          0.214 m LVOT/AV VTI ratio: 0.78  AORTA Ao Root diam: 3.70 cm MITRAL VALVE MV Area (PHT): 4.17 cm    SHUNTS MV Area VTI:   3.54 cm    Systemic VTI:  0.21 m MV Peak grad:  3.5 mmHg    Systemic Diam: 2.20 cm MV Mean grad:  2.0 mmHg MV Vmax:       0.93 m/s MV Vmean:      61.1 cm/s MV Decel Time: 182 msec MV E velocity: 89.50 cm/s MV A velocity: 67.70 cm/s MV E/A ratio:  1.32 Christopher End MD Electronically signed by Nelva Bush MD Signature Date/Time: 08/16/2020/5:38:37 PM    Final     Acute pulmonary embolism ([place]) #60 year old male patient with squamous cell carcinoma base of tongue currently s/p chemoradiation [finished February 23] is currently in the hospital for pleuritic chest pain/diagnosed with acute PE  # 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 S/p evaluation with Dr.Bennett; awaiting evaluation with tertiary center for possible surgery for respiratory disease. STABLE.  Awaiting to order PET scan first week of May 2022.  #Acute bilateral PE-likely secondary to chemotherapy/immobility-agree with IV heparin clinically improved.  Recommend switching over to Eliquis  10 mg twice daily for 1 week followed by 5 mg Eliquis daily. STABLE.  2D echo preserved ejection fraction.  No right heart strain.  #Anemia hemoglobin- ? Etiology- no active signs of bleeding; suspect multifactorial/recent chemotherapy- today- Hb 8.4; monitor closely on Eliquis.   #Recent dehydration/acute renal failure-secondary cisplatin-currently creatinine improved 1.2.  Continue holding nephrotoxic agent/antihypertensives.    #Right parasternal mass with calcifications-incidentally noted on abdominal CT scan-PET negative suspect benign lesion.  We will plan MRI as recommended on non- emergency basis.  Pt will follow up with me in clinic next week.      Cammie Sickle, MD 08/17/2020  9:19 PM

## 2020-08-17 NOTE — Assessment & Plan Note (Addendum)
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 S/p evaluation with Dr.Bennett; awaiting evaluation with tertiary center for possible surgery for respiratory disease. STABLE.  Awaiting to order PET scan first week of May 2022.  #Acute bilateral PE-likely secondary to chemotherapy/immobility-agree with IV heparin clinically improved.  Recommend switching over to Eliquis 10 mg twice daily for 1 week followed by 5 mg Eliquis daily. STABLE.  2D echo preserved ejection fraction.  No right heart strain.  #Anemia hemoglobin- ? Etiology- no active signs of bleeding; suspect multifactorial/recent chemotherapy- today- Hb 8.4; monitor closely on Eliquis.   #Recent dehydration/acute renal failure-secondary cisplatin-currently creatinine improved 1.2.  Continue holding nephrotoxic agent/antihypertensives.    #Right parasternal mass with calcifications-incidentally noted on abdominal CT scan-PET negative suspect benign lesion.  We will plan MRI as recommended on non- emergency basis.  Pt will follow up with me in clinic next week.

## 2020-08-17 NOTE — Progress Notes (Signed)
Discharge instructions explained to pt/ verbalized an understanding/ iv and tele removed/ eliquis coupon given/ will transport off unit via wheelchair.

## 2020-08-17 NOTE — Discharge Summary (Signed)
Physician Discharge Summary  Joshua Bray IDP:[id number] DOB: 1961/05/04 DOA: 08/14/2020  PCP: Verl Bangs, FNP  Admit date: 08/14/2020 Discharge date: 08/17/2020  Admitted From: home  Disposition: home   Recommendations for Outpatient Follow-up:  1. Follow up with PCP in 1-2 weeks 2. F/u w/ onco, Dr. Rogue Bussing in 1 week  Home Health: no Equipment/Devices:  Discharge Condition: stable  CODE STATUS: full  Diet recommendation: Heart Healthy / Carb Modified   Brief/Interim Summary: HPI was taken from Dr. Sidney Ace: Joshua Bray is a 60 y.o. 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 Rogue Bussing, presented to the emergency room with acute onset of left lower rib and upper quadrant abdominal pain with radiation to his back as well as exacerbation by deep breathing.  He feels stabbing pain with deep breathing and his radiation to the middle of his left chest and his left shoulder.  He has been having occasional cough productive of clear and whitish sputum and mild dyspnea when he takes a deep breath due to pain.  He thinks he may have aspirated a couple of times with dry heaving.  No fever or chills.  No rhinorrhea or nasal congestion or sore throat.  No dysuria, oliguria or hematuria or flank pain. ED Course: When he came to the ER heart rate was 115 with a blood pressure 129/94 and otherwise normal vital signs.  Labs revealed sodium 134 and creatinine 1.27 with otherwise unremarkable CMP.  High-sensitivity troponin I was 9 and later 8 and BNP 28.  CBC showed mild anemia similar to previous levels.  And lactic acid was 1.2 EKG as reviewed by me : Showed sinus rhythm with rate of 96. Imaging: Chest x-ray showed small left pleural effusion with basal airspace disease, atelectasis or pneumonia. Chest CTA  revealed: 1. Positive for acute bilateral pulmonary emboli as described above. No evidence for right heart strain. 2. Partial consolidation and ground-glass density in the left lower lobe favored to represent pneumonia or aspiration over infarct. Trace left-sided pleural effusion. 3. Aortic atherosclerosis Abdominal pelvic CT revealed 1. No CT evidence for acute intra-abdominal or pelvic abnormality. 2. Small left pleural effusion. Airspace disease in the left lung base which may reflect atelectasis, pneumonia or aspiration. 3. 10.8 x 4.2 x 14 cm fat density mass superficial to the right paraspinal muscles with small internal vessels and small calcifications within. The presence of enhancing of internal enhancing vessels is somewhat unusual; suggest lumbar MRI for further characterization of the mass. This may be performed on a nonemergent basis. 4.  Aortic Atherosclerosis.  The patient was given IV heparin bolus and drip as well as 2 mg then 4 mg of IV morphine sulfate.  He will be admitted to a progressive unit bed for further evaluation and management.    Hospital Course from Dr. Jimmye Norman 4/11-4/12/22: Pt was found to have acute pulmonary embolism. Pt was initially started on IV heparin and then pt was switched over to eliquis. Pt tolerated eliquis well so far. Echo did not mention any right heart strain, EF 44-31%, diastolic function was normal & no regional wall motion abnormalities were noted. [place] of b/l LE was neg for DVT. Of note, pt was found to be deficient of folate as well as low end of normal of B12. Pt was started on B12 and folate supplements. Also, pt was treated  for aspiration pneumonia w/ IV abxs and was sent home w/ po abxs to finish the course. For more information please see previous progress/consult notes.   Discharge Diagnoses:  Active Problems:   Acute pulmonary embolism (HCC)  Acute pulmonary embolism: d/c IV heparin and continue on eliquis. Echo did not mention  any right heart strain, EF 27-78%, normal diastolic function. B/l LE [place] neg for DVT  Aspiration pneumonia: continue on IV rocephin, azithromycin. Pt is allergic to penicillins. Continue on bronchodilators and encourage incentive spirometry  Thrombocytopenia: etiology unclear. Will continue to monitor   Macrocytic anemia: secondary to vitamin B12 is on low end of normal. Will start B12 supplement. Folate is low so will start supplement   HTN: continue on amlodipine  DM2: likely poorly controlled. 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 Present & unchanged from original PET scan   Obesity: BMI 38.4. Complicates overall care and prognosis    Discharge Instructions  Discharge Instructions    Diet - low sodium heart healthy   Complete by: As directed    Diet Carb Modified   Complete by: As directed    Discharge instructions   Complete by: As directed    F/u w/ onco, Dr. Rogue Bussing, in 1 week. F/u w/ PCP in 1-2 weeks   Increase activity slowly   Complete by: As directed      Allergies as of 08/17/2020      Reactions   Penicillins    Unknown, allergy from an infant      Medication List    STOP taking these medications   sucralfate 1 g tablet Commonly known as: Carafate     TAKE these medications   acetaminophen 500 MG tablet Commonly known as: TYLENOL Take 1,000 mg by mouth 2 (two) times daily.   amLODipine 10 MG tablet Commonly known as: NORVASC Take 1 tablet (10 mg total) by mouth daily.   apixaban 5 MG Tabs tablet Commonly known as: ELIQUIS Take 2 tablets (10 mg total) by mouth 2 (two) times daily for 6 days.   apixaban 5 MG Tabs tablet Commonly known as: ELIQUIS Take 1 tablet (5 mg total) by mouth 2 (two) times daily. Only start this script after completing eliquis 10mg  BID x 6 days Start taking on: August 23, 2020   azithromycin 250 MG tablet Commonly known as: Zithromax Take 1 tablet (250 mg total) by  mouth daily for 2 days.   ezetimibe 10 MG tablet Commonly known as: Zetia Take 1 tablet (10 mg total) by mouth daily.   folic acid 1 MG tablet Commonly known as: FOLVITE Take 1 tablet (1 mg total) by mouth daily. Start taking on: August 18, 2020   metFORMIN 500 MG tablet Commonly known as: GLUCOPHAGE Take 1 tablet (500 mg total) by mouth 2 (two) times daily with a meal.   morphine CONCENTRATE 10 mg / 0.5 ml concentrated solution Take 0.25 mLs (5 mg total) by mouth every 6 (six) hours as needed for anxiety (difficulty sleeping).   mupirocin ointment 2 % Commonly known as: BACTROBAN Apply 1 application topically 2 (two) times daily.   nystatin powder Commonly known as: MYCOSTATIN/NYSTOP Apply 1 application topically 3 (three) times daily.   ondansetron 8 MG tablet Commonly known as: ZOFRAN Take 1 tablet (8 mg total) by mouth every 8 (eight) hours as needed for nausea or vomiting.   prochlorperazine 10 MG tablet Commonly known as: COMPAZINE Take 1 tablet (10  mg total) by mouth every 6 (six) hours as needed for nausea or vomiting.   rosuvastatin 20 MG tablet Commonly known as: Crestor Take 1 tablet (20 mg total) by mouth daily.   vitamin B-12 500 MCG tablet Commonly known as: CYANOCOBALAMIN Take 1 tablet (500 mcg total) by mouth daily for 14 days.       Allergies  Allergen Reactions  . Penicillins     Unknown, allergy from an infant    Consultations: onco  Procedures/Studies: CT Angio Chest PE W and/or Wo Contrast  Result Date: 08/15/2020 CLINICAL DATA:  Chest pain short of breath EXAM: CT ANGIOGRAPHY CHEST WITH CONTRAST TECHNIQUE: Multidetector CT imaging of the chest was performed using the standard protocol during bolus administration of intravenous contrast. Multiplanar CT image reconstructions and MIPs were obtained to evaluate the vascular anatomy. CONTRAST:  130mL OMNIPAQUE IOHEXOL 350 MG/ML SOLN COMPARISON:  PET CT 04/21/2020 FINDINGS: Cardiovascular:  Satisfactory opacification of the pulmonary arteries to the segmental level. Acute filling defect within right middle lobe pulmonary artery with extension into segmental and subsegmental branches. Small acute emboli within right lower lobe segmental and subsegmental vessels. Acute left lower lobe subsegmental pulmonary embolus. No evidence for right heart strain. Nonaneurysmal aorta without dissection. Mild aortic atherosclerosis. Coronary vascular calcification. Borderline to mild cardiomegaly. No pericardial effusion Mediastinum/Nodes: Midline trachea. No thyroid mass. Esophagus within normal limits. No suspicious adenopathy Lungs/Pleura: Trace left-sided pleural effusion. Partial consolidation and ground-glass density in the left lower lobe favored to represent pneumonia over infarct. Mild atelectasis right base. Upper Abdomen: No acute abnormality. Musculoskeletal: No chest wall abnormality. No acute or significant osseous findings. Review of the MIP images confirms the above findings. IMPRESSION: 1. Positive for acute bilateral pulmonary emboli as described above. No evidence for right heart strain. 2. Partial consolidation and ground-glass density in the left lower lobe favored to represent pneumonia or aspiration over infarct. Trace left-sided pleural effusion. 3. Aortic atherosclerosis. Critical Value/emergent results were called by telephone at the time of interpretation on 08/15/2020 at 2:08 am to provider MARK QUALE , who verbally acknowledged these results. Aortic Atherosclerosis (ICD10-I70.0). Electronically Signed   By: Donavan Foil M.D.   On: 08/15/2020 02:08   CT ABDOMEN PELVIS W CONTRAST  Result Date: 08/15/2020 CLINICAL DATA:  Left upper quadrant pain EXAM: CT ABDOMEN AND PELVIS WITH CONTRAST TECHNIQUE: Multidetector CT imaging of the abdomen and pelvis was performed using the standard protocol following bolus administration of intravenous contrast. CONTRAST:  159mL OMNIPAQUE IOHEXOL 350 MG/ML  SOLN COMPARISON:  None FINDINGS: Lower chest: Lung bases demonstrate small left pleural effusion. Airspace disease in the left lung base which may reflect atelectasis, pneumonia or aspiration. Mild atelectasis right base Hepatobiliary: No focal liver abnormality is seen. No gallstones, gallbladder wall thickening, or biliary dilatation. Pancreas: Unremarkable. No pancreatic ductal dilatation or surrounding inflammatory changes. Spleen: Normal in size without focal abnormality. Adrenals/Urinary Tract: Adrenal glands are unremarkable. Kidneys are normal, without renal calculi, focal lesion, or hydronephrosis. Bladder is unremarkable. Stomach/Bowel: Stomach is within normal limits. Appendix appears normal. No evidence of bowel wall thickening, distention, or inflammatory changes. Vascular/Lymphatic: Mild aortic atherosclerosis. No aneurysm. No suspicious nodes Reproductive: Prostate is unremarkable. Other: Negative for free air or free fluid. Fat within the bilateral inguinal canals. Musculoskeletal: Hardware with artifact in the left femur. Fat density mass superficial to the right paraspinal muscles with small internal vessels. There are small calcifications within. The mass measures approximately 10.8 by 4.2 by 14 cm. IMPRESSION: 1. No CT evidence  for acute intra-abdominal or pelvic abnormality. 2. Small left pleural effusion. Airspace disease in the left lung base which may reflect atelectasis, pneumonia or aspiration. 3. 10.8 x 4.2 x 14 cm fat density mass superficial to the right paraspinal muscles with small internal vessels and small calcifications within. The presence of enhancing of internal enhancing vessels is somewhat unusual; suggest lumbar MRI for further characterization of the mass. This may be performed on a nonemergent basis. Aortic Atherosclerosis (ICD10-I70.0). Electronically Signed   By: Donavan Foil M.D.   On: 08/15/2020 02:12   US Venous Img Lower Bilateral (DVT)  Result Date:  08/15/2020 CLINICAL DATA:  60 year old male with history of pulmonary embolism. EXAM: BILATERAL LOWER EXTREMITY VENOUS DOPPLER ULTRASOUND TECHNIQUE: Gray-scale sonography with graded compression, as well as color Doppler and duplex ultrasound were performed to evaluate the lower extremity deep venous systems from the level of the common femoral vein and including the common femoral, femoral, profunda femoral, popliteal and calf veins including the posterior tibial, peroneal and gastrocnemius veins when visible. The superficial great saphenous vein was also interrogated. Spectral Doppler was utilized to evaluate flow at rest and with distal augmentation maneuvers in the common femoral, femoral and popliteal veins. COMPARISON:  None. FINDINGS: RIGHT LOWER EXTREMITY Common Femoral Vein: No evidence of thrombus. Normal compressibility, respiratory phasicity and response to augmentation. Saphenofemoral Junction: No evidence of thrombus. Normal compressibility and flow on color Doppler imaging. Profunda Femoral Vein: No evidence of thrombus. Normal compressibility and flow on color Doppler imaging. Femoral Vein: No evidence of thrombus. Normal compressibility, respiratory phasicity and response to augmentation. Popliteal Vein: No evidence of thrombus. Normal compressibility, respiratory phasicity and response to augmentation. Calf Veins: No evidence of thrombus. Normal compressibility and flow on color Doppler imaging. Other Findings:  None. LEFT LOWER EXTREMITY Common Femoral Vein: No evidence of thrombus. Normal compressibility, respiratory phasicity and response to augmentation. Saphenofemoral Junction: No evidence of thrombus. Normal compressibility and flow on color Doppler imaging. Profunda Femoral Vein: No evidence of thrombus. Normal compressibility and flow on color Doppler imaging. Femoral Vein: No evidence of thrombus. Normal compressibility, respiratory phasicity and response to augmentation. Popliteal Vein:  No evidence of thrombus. Normal compressibility, respiratory phasicity and response to augmentation. Calf Veins: No evidence of thrombus. Normal compressibility and flow on color Doppler imaging. Other Findings:  None. IMPRESSION: No evidence of bilateral extremity deep vein thrombosis. Ruthann Cancer, MD [facility] Electronically Signed   By: Ruthann Cancer MD   On: 08/15/2020 13:47   DG Chest Portable 1 View  Result Date: 08/15/2020 CLINICAL DATA:  Pleuritic chest pain EXAM: PORTABLE CHEST 1 VIEW COMPARISON:  PET CT 04/21/2020 FINDINGS: Right-sided central venous port tip over the SVC. Small left-sided pleural effusion with basilar airspace disease. Borderline cardiomegaly. No pneumothorax IMPRESSION: Small left pleural effusion with basilar airspace disease, atelectasis or pneumonia. Electronically Signed   By: Donavan Foil M.D.   On: 08/15/2020 00:12   DG Swallowing Func-Speech Pathology  Result Date: 08/16/2020 Objective Swallowing Evaluation: Type of Study: MBS-Modified Barium Swallow Study  Patient Details Name: Joshua Bray MRN: [id number] Date of Birth: 04/27/1961 Today's Date: 08/16/2020 Time: SLP Start Time (ACUTE ONLY): 1120 -SLP Stop Time (ACUTE ONLY): 1140 SLP Time Calculation (min) (ACUTE ONLY): 20 min Past Medical History: Past Medical History: Diagnosis Date . Diabetes mellitus without complication ([place])  . Hypertension  . Mucositis  Past Surgical History: Past Surgical History: Procedure Laterality Date . FEMUR SURGERY Left 1983 . IR IMAGING  GUIDED PORT INSERTION  04/16/2020 HPI: Rondall Radigan is a 60 y.o. 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 Rogue Bussing, presented to the emergency room with acute onset of left lower rib and upper quadrant  abdominal pain with radiation to his back as well as exacerbation by deep breathing.  Chest CTA was positive for acute bilateral pulmonary emboli and partial consolidation and ground-glass density in the left lower lobe facored to represent pneumonia or aspiration over infarct.  Subjective: pt pleasant, appears to be good historian Assessment / Plan / Recommendation CHL IP CLINICAL IMPRESSIONS 08/16/2020 Clinical Impression Overall pt presents with adequate oropharyngeal abilities with no penetration or aspiration of any consistencies assessed. Pt reports that his mouth continues to be very dry and he attributes mildly lengthy oral phase to that as well as missing molars. He does demonstrate mild oral phase deficits that results in decreased bolus cohesion with oral residue spills into pyriform sinuses post swallow. With cue for dry swallow, pt clears pharyngeal residue. At this time, recommend pt continue currently prescribed diet with thin liquids via cup or straw, medicines whole with thin liquids or whole with puree if his mouth is dry. All education completed. SLP Visit Diagnosis Dysphagia, unspecified (R13.10) Attention and concentration deficit following -- Frontal lobe and executive function deficit following -- Impact on safety and function Mild aspiration risk   CHL IP TREATMENT RECOMMENDATION 08/16/2020 Treatment Recommendations No treatment recommended at this time   Prognosis 08/16/2020 Prognosis for Safe Diet Advancement Good Barriers to Reach Goals (No Data) Barriers/Prognosis Comment -- CHL IP DIET RECOMMENDATION 08/16/2020 SLP Diet Recommendations Regular solids;Thin liquid Liquid Administration via Cup;Straw Medication Administration Whole meds with liquid Compensations Minimize environmental distractions;Slow rate;Small sips/bites Postural Changes Seated upright at 90 degrees   CHL IP OTHER RECOMMENDATIONS 08/16/2020 Recommended Consults -- Oral Care Recommendations Oral care BID Other Recommendations  --   CHL IP FOLLOW UP RECOMMENDATIONS 08/16/2020 Follow up Recommendations None   CHL IP FREQUENCY AND DURATION 08/16/2020 Speech Therapy Frequency (ACUTE ONLY) min 1 x/week Treatment Duration 1 week      CHL IP ORAL PHASE 08/16/2020 Oral Phase Impaired Oral - Pudding Teaspoon -- Oral - Pudding Cup -- Oral - Honey Teaspoon -- Oral - Honey Cup -- Oral - Nectar Teaspoon -- Oral - Nectar Cup -- Oral - Nectar Straw -- Oral - Thin Teaspoon -- Oral - Thin Cup Weak lingual manipulation;Delayed oral transit;Lingual/palatal residue;Decreased bolus cohesion Oral - Thin Straw Weak lingual manipulation;Reduced posterior propulsion;Delayed oral transit;Decreased bolus cohesion Oral - Puree Weak lingual manipulation;Reduced posterior propulsion;Delayed oral transit;Decreased bolus cohesion Oral - Mech Soft Weak lingual manipulation;Decreased bolus cohesion;Premature spillage;Delayed oral transit;Reduced posterior propulsion Oral - Regular -- Oral - Multi-Consistency -- Oral - Pill (No Data) Oral Phase - Comment --  CHL IP PHARYNGEAL PHASE 08/16/2020 Pharyngeal Phase Impaired Pharyngeal- Pudding Teaspoon -- Pharyngeal -- Pharyngeal- Pudding Cup -- Pharyngeal -- Pharyngeal- Honey Teaspoon -- Pharyngeal -- Pharyngeal- Honey Cup -- Pharyngeal -- Pharyngeal- Nectar Teaspoon -- Pharyngeal -- Pharyngeal- Nectar Cup -- Pharyngeal -- Pharyngeal- Nectar Straw -- Pharyngeal -- Pharyngeal- Thin Teaspoon -- Pharyngeal -- Pharyngeal- Thin Cup Delayed swallow initiation-vallecula;Pharyngeal residue - pyriform Pharyngeal -- Pharyngeal- Thin Straw Delayed swallow initiation-vallecula;Pharyngeal residue - pyriform Pharyngeal -- Pharyngeal- Puree Delayed swallow initiation-pyriform sinuses;Delayed swallow initiation-vallecula;Pharyngeal residue - pyriform Pharyngeal -- Pharyngeal- Mechanical Soft Delayed swallow initiation-vallecula;Pharyngeal residue -  pyriform;Pharyngeal residue - valleculae Pharyngeal -- Pharyngeal- Regular -- Pharyngeal --  Pharyngeal- Multi-consistency -- Pharyngeal -- Pharyngeal- Pill Pharyngeal residue - valleculae Pharyngeal -- Pharyngeal Comment --  CHL IP CERVICAL ESOPHAGEAL PHASE 08/16/2020 Cervical Esophageal Phase WFL Pudding Teaspoon -- Pudding Cup -- Honey Teaspoon -- Honey Cup -- Nectar Teaspoon -- Nectar Cup -- Nectar Straw -- Thin Teaspoon -- Thin Cup -- Thin Straw -- Puree -- Mechanical Soft -- Regular -- Multi-consistency -- Pill -- Cervical Esophageal Comment -- Happi Overton 08/16/2020, 12:38 PM              ECHOCARDIOGRAM COMPLETE  Result Date: 08/16/2020    ECHOCARDIOGRAM REPORT   Patient Name:   Joshua Bray Date of Exam: 08/16/2020 Medical Rec #:  [id number]     Height:       70.0 in Accession #:    [id number]    Weight:       268.1 lb Date of Birth:  Oct 27, 1960     BSA:          2.364 m Patient Age:    69 years      BP:           147/83 mmHg Patient Gender: M             HR:           82 bpm. Exam Location:  [facility] Procedure: 2D Echo, Color Doppler, Cardiac Doppler and Intracardiac            Opacification Agent Indications:     I26.09 Pulmonary Embolus  History:         Patient has no prior history of Echocardiogram examinations.                  Risk Factors:Hypertension and Diabetes.  Sonographer:     Charmayne Sheer RDCS (AE) Referring Phys:  [id number] Arvella Merles MANSY Diagnosing Phys: Harrell Gave End MD  Sonographer Comments: Suboptimal apical window and suboptimal subcostal window. IMPRESSIONS  1. Left ventricular ejection fraction, by estimation, is 65 to 70%. The left ventricle has normal function. The left ventricle has no regional wall motion abnormalities. There is mild left ventricular hypertrophy. Left ventricular diastolic parameters were normal.  2. Right ventricular systolic function is normal. The right ventricular size is mildly enlarged. Tricuspid regurgitation signal is inadequate for assessing PA pressure.  3. The mitral valve is normal in structure. Trivial mitral valve regurgitation. No evidence of  mitral stenosis.  4. The aortic valve is tricuspid. Aortic valve regurgitation is not visualized. No aortic stenosis is present.  5. The inferior vena cava is normal in size with greater than 50% respiratory variability, suggesting right atrial pressure of 3 mmHg. FINDINGS  Left Ventricle: Left ventricular ejection fraction, by estimation, is 65 to 70%. The left ventricle has normal function. The left ventricle has no regional wall motion abnormalities. Definity contrast agent was given IV to delineate the left ventricular  endocardial borders. The left ventricular internal cavity size was normal in size. There is mild left ventricular hypertrophy. Left ventricular diastolic parameters were normal. Right Ventricle: The right ventricular size is mildly enlarged. No increase in right ventricular wall thickness. Right ventricular systolic function is normal. Tricuspid regurgitation signal is inadequate for assessing PA pressure. Left Atrium: Left atrial size was normal in size. Right Atrium: Right atrial size was normal in size. Pericardium: There is no evidence of pericardial effusion. Mitral Valve: The mitral valve is normal in structure. Trivial mitral valve regurgitation. No  evidence of mitral valve stenosis. MV peak gradient, 3.5 mmHg. The mean mitral valve gradient is 2.0 mmHg. Tricuspid Valve: The tricuspid valve is normal in structure. Tricuspid valve regurgitation is trivial. Aortic Valve: The aortic valve is tricuspid. Aortic valve regurgitation is not visualized. No aortic stenosis is present. Aortic valve mean gradient measures 6.0 mmHg. Aortic valve peak gradient measures 8.4 mmHg. Aortic valve area, by VTI measures 2.96 cm. Pulmonic Valve: The pulmonic valve was not well visualized. Pulmonic valve regurgitation is not visualized. No evidence of pulmonic stenosis. Aorta: The aortic root is normal in size and structure. Pulmonary Artery: The pulmonary artery is not well seen. Venous: The inferior vena cava  is normal in size with greater than 50% respiratory variability, suggesting right atrial pressure of 3 mmHg. IAS/Shunts: The interatrial septum was not well visualized.  LEFT VENTRICLE PLAX 2D LVIDd:         4.40 cm  Diastology LVIDs:         2.70 cm  LV e' medial:    9.57 cm/s LV PW:         1.20 cm  LV E/e' medial:  9.4 LV IVS:        1.14 cm  LV e' lateral:   8.92 cm/s LVOT diam:     2.20 cm  LV E/e' lateral: 10.0 LV SV:         81 LV SV Index:   34 LVOT Area:     3.80 cm  RIGHT VENTRICLE RV Basal diam:  5.20 cm LEFT ATRIUM             Index       RIGHT ATRIUM           Index LA diam:        3.10 cm 1.31 cm/m  RA Area:     11.40 cm LA Vol (A2C):   73.8 ml 31.22 ml/m RA Volume:   22.60 ml  9.56 ml/m LA Vol (A4C):   57.1 ml 24.15 ml/m LA Biplane Vol: 67.3 ml 28.47 ml/m  AORTIC VALVE                    PULMONIC VALVE AV Area (Vmax):    3.04 cm     PV Vmax:       1.16 m/s AV Area (Vmean):   2.71 cm     PV Vmean:      76.700 cm/s AV Area (VTI):     2.96 cm     PV VTI:        0.177 m AV Vmax:           145.00 cm/s  PV Peak grad:  5.4 mmHg AV Vmean:          117.000 cm/s PV Mean grad:  3.0 mmHg AV VTI:            0.275 m AV Peak Grad:      8.4 mmHg AV Mean Grad:      6.0 mmHg LVOT Vmax:         116.00 cm/s LVOT Vmean:        83.300 cm/s LVOT VTI:          0.214 m LVOT/AV VTI ratio: 0.78  AORTA Ao Root diam: 3.70 cm MITRAL VALVE MV Area (PHT): 4.17 cm    SHUNTS MV Area VTI:   3.54 cm    Systemic VTI:  0.21 m MV Peak grad:  3.5 mmHg  Systemic Diam: 2.20 cm MV Mean grad:  2.0 mmHg MV Vmax:       0.93 m/s MV Vmean:      61.1 cm/s MV Decel Time: 182 msec MV E velocity: 89.50 cm/s MV A velocity: 67.70 cm/s MV E/A ratio:  1.32 Nelva Bush MD Electronically signed by Nelva Bush MD Signature Date/Time: 08/16/2020/5:38:37 PM    Final      Subjective: Pt c/o fatigue    Discharge Exam: Vitals:   08/17/20 0737 08/17/20 1126  BP: 116/61 (!) 169/87  Pulse: 75 93  Resp: 16 18  Temp: 97.9 F (36.6 C)  98.6 F (37 C)  SpO2: 95% 96%   Vitals:   08/16/20 1953 08/17/20 0420 08/17/20 0737 08/17/20 1126  BP: (!) 155/73 (!) 141/73 116/61 (!) 169/87  Pulse: 94 79 75 93  Resp: 18 18 16 18   Temp: 98.8 F (37.1 C) 99.5 F (37.5 C) 97.9 F (36.6 C) 98.6 F (37 C)  TempSrc: Oral Oral Oral Oral  SpO2: 97% 95% 95% 96%  Weight:   120.9 kg   Height:        General: Pt is alert, awake, not in acute distress. Obese Cardiovascular: S1/S2 +, no rubs, no gallops Respiratory: diminished breath sounds b/l otherwise clear Abdominal: Soft, NT, obese, bowel sounds + Extremities:  no cyanosis    The results of significant diagnostics from this hospitalization (including imaging, microbiology, ancillary and laboratory) are listed below for reference.     Microbiology: Recent Results (from the past 240 hour(s))  SARS CORONAVIRUS 2 (TAT 6-24 HRS) Nasopharyngeal Nasopharyngeal Swab     Status: None   Collection Time: 08/15/20  2:36 AM   Specimen: Nasopharyngeal Swab  Result Value Ref Range Status   SARS Coronavirus 2 NEGATIVE NEGATIVE Final    Comment: (NOTE) SARS-CoV-2 target nucleic acids are NOT DETECTED.  The SARS-CoV-2 RNA is generally detectable in upper and lower respiratory specimens during the acute phase of infection. Negative results do not preclude SARS-CoV-2 infection, do not rule out co-infections with other pathogens, and should not be used as the sole basis for treatment or other patient management decisions. Negative results must be combined with clinical observations, patient history, and epidemiological information. The expected result is Negative.  Fact Sheet for Patients: SugarRoll.be  Fact Sheet for Healthcare Providers: https://www.woods-mathews.com/  This test is not yet approved or cleared by the [place] FDA and  has been authorized for detection and/or diagnosis of SARS-CoV-2 by FDA under an Emergency Use  Authorization (EUA). This EUA will remain  in effect (meaning this test can be used) for the duration of the COVID-19 declaration under Se ction 564(b)(1) of the Act, 21 U.S.C. section 360bbb-3(b)(1), unless the authorization is terminated or revoked sooner.  Performed at [facility], [place] [address], [place], [place] [address]      Labs: BNP (last 3 results) Recent Labs    08/15/20 0006  BNP 32.4   Basic Metabolic Panel: Recent Labs  Lab 08/11/20 1302 08/15/20 0006 08/15/20 0434 08/17/20 0542  NA 136 134* 134* 135  K 4.1 3.9 4.2 3.9  CL 101 100 100 100  CO2 25 26 28 26   GLUCOSE 122* 139* 137* 111*  BUN 18 18 19 9   CREATININE 1.48* 1.27* 1.22 0.98  CALCIUM 8.8* 9.1 8.7* 8.7*  MG 1.8  --   --  1.9   Liver Function Tests: Recent Labs  Lab 08/11/20 1302 08/15/20 0006  AST 22 15  ALT 10 9  ALKPHOS 83 98  BILITOT 0.7 1.1  PROT 6.2* 7.2  ALBUMIN 3.2* 3.6   Recent Labs  Lab 08/15/20 0006  LIPASE 49   No results for input(s): AMMONIA in the last 168 hours. CBC: Recent Labs  Lab 08/11/20 1302 08/15/20 0006 08/15/20 0434 08/16/20 0424 08/17/20 0542  WBC 4.1 4.8 4.5 3.8* 3.6*  NEUTROABS 2.3 3.7  --   --   --   HGB 9.6* 10.0* 8.7* 7.6* 8.5*  HCT 27.7* 30.1* 26.0* 23.2* 25.1*  MCV 99.3 101.0* 101.2* 101.8* 101.2*  PLT 164 164 141* 135* 176   Cardiac Enzymes: No results for input(s): CKTOTAL, CKMB, CKMBINDEX, TROPONINI in the last 168 hours. BNP: Invalid input(s): POCBNP CBG: Recent Labs  Lab 08/15/20 0909 08/15/20 1146 08/15/20 1637  GLUCAP 119* 157* 113*   D-Dimer No results for input(s): DDIMER in the last 72 hours. Hgb A1c No results for input(s): HGBA1C in the last 72 hours. Lipid Profile No results for input(s): CHOL, HDL, LDLCALC, TRIG, CHOLHDL, LDLDIRECT in the last 72 hours. Thyroid function studies Recent Labs    08/16/20 0424  TSH 1.482   Anemia work up Recent Labs    08/16/20 0424  VITAMINB12 197  FOLATE 5.3*   FERRITIN 812*  TIBC 181*  IRON 31*   Urinalysis    Component Value Date/Time   COLORURINE YELLOW (A) 08/15/2020 0236   APPEARANCEUR CLEAR (A) 08/15/2020 0236   LABSPEC >1.046 (H) 08/15/2020 0236   PHURINE 5.0 08/15/2020 0236   GLUCOSEU NEGATIVE 08/15/2020 0236   HGBUR NEGATIVE 08/15/2020 0236   BILIRUBINUR NEGATIVE 08/15/2020 0236   KETONESUR NEGATIVE 08/15/2020 0236   PROTEINUR NEGATIVE 08/15/2020 0236   NITRITE NEGATIVE 08/15/2020 0236   LEUKOCYTESUR NEGATIVE 08/15/2020 0236   Sepsis Labs Invalid input(s): PROCALCITONIN,  WBC,  LACTICIDVEN Microbiology Recent Results (from the past 240 hour(s))  SARS CORONAVIRUS 2 (TAT 6-24 HRS) Nasopharyngeal Nasopharyngeal Swab     Status: None   Collection Time: 08/15/20  2:36 AM   Specimen: Nasopharyngeal Swab  Result Value Ref Range Status   SARS Coronavirus 2 NEGATIVE NEGATIVE Final    Comment: (NOTE) SARS-CoV-2 target nucleic acids are NOT DETECTED.  The SARS-CoV-2 RNA is generally detectable in upper and lower respiratory specimens during the acute phase of infection. Negative results do not preclude SARS-CoV-2 infection, do not rule out co-infections with other pathogens, and should not be used as the sole basis for treatment or other patient management decisions. Negative results must be combined with clinical observations, patient history, and epidemiological information. The expected result is Negative.  Fact Sheet for Patients: SugarRoll.be  Fact Sheet for Healthcare Providers: https://www.woods-mathews.com/  This test is not yet approved or cleared by the [place] FDA and  has been authorized for detection and/or diagnosis of SARS-CoV-2 by FDA under an Emergency Use Authorization (EUA). This EUA will remain  in effect (meaning this test can be used) for the duration of the COVID-19 declaration under Se ction 564(b)(1) of the Act, 21 U.S.C. section 360bbb-3(b)(1), unless  the authorization is terminated or revoked sooner.  Performed at [facility], [place] [address], [place], [place] [address]      Time coordinating discharge: Over 30 minutes  SIGNED:   Wyvonnia Dusky, MD  [facility] 08/17/2020, 3:03 PM Pager   If 7PM-7AM, please contact night-coverage

## 2020-08-18 ENCOUNTER — Inpatient Hospital Stay: Payer: 59

## 2020-08-18 ENCOUNTER — Encounter: Payer: Self-pay | Admitting: Internal Medicine

## 2020-08-18 LAB — CARDIOLIPIN ANTIBODIES, IGG, IGM, IGA
Anticardiolipin IgA: <9 APL U/mL (ref 0–11)
Anticardiolipin IgG: <9 GPL U/mL (ref 0–14)
Anticardiolipin IgM: <9 MPL U/mL (ref 0–12)

## 2020-08-19 ENCOUNTER — Other Ambulatory Visit: Payer: Self-pay | Admitting: *Deleted

## 2020-08-19 MED ORDER — MORPHINE SULFATE (CONCENTRATE) 10 MG /0.5 ML PO SOLN: 5.0000 mg | Freq: Four times a day (QID) | ORAL | 0 refills | Status: DC | PRN

## 2020-08-19 MED FILL — MORPHINE SULF 100 MG/5 ML CONC: 30 days supply | Qty: 30 | Fill #0

## 2020-08-19 NOTE — Telephone Encounter (Signed)
Spoke to patient. Morphine for pain secondary to PE.

## 2020-08-19 NOTE — Telephone Encounter (Signed)
Patient needs Rf on morphine. Please submit for patient on behalf of Dr. Rogue Bussing.

## 2020-08-20 LAB — FACTOR 5 LEIDEN

## 2020-08-20 LAB — PROTHROMBIN GENE MUTATION

## 2020-08-24 ENCOUNTER — Other Ambulatory Visit: Payer: Self-pay | Admitting: *Deleted

## 2020-08-25 ENCOUNTER — Inpatient Hospital Stay: Payer: 59

## 2020-08-25 ENCOUNTER — Encounter: Payer: Self-pay | Admitting: Internal Medicine

## 2020-08-25 ENCOUNTER — Ambulatory Visit: Payer: 59

## 2020-08-25 ENCOUNTER — Inpatient Hospital Stay (HOSPITAL_BASED_OUTPATIENT_CLINIC_OR_DEPARTMENT_OTHER): Payer: 59 | Admitting: Internal Medicine

## 2020-08-25 ENCOUNTER — Other Ambulatory Visit: Payer: Self-pay

## 2020-08-25 VITALS — BP 167/102 | HR 85 | Temp 97.5°F | Resp 20 | Ht 70.0 in | Wt 268.4 lb

## 2020-08-25 DIAGNOSIS — C01 Malignant neoplasm of base of tongue: Secondary | ICD-10-CM

## 2020-08-25 DIAGNOSIS — Z95828 Presence of other vascular implants and grafts: Secondary | ICD-10-CM

## 2020-08-25 DIAGNOSIS — Z87891 Personal history of nicotine dependence: Secondary | ICD-10-CM | POA: Diagnosis not present

## 2020-08-25 DIAGNOSIS — R609 Edema, unspecified: Secondary | ICD-10-CM | POA: Diagnosis not present

## 2020-08-25 DIAGNOSIS — E119 Type 2 diabetes mellitus without complications: Secondary | ICD-10-CM | POA: Diagnosis not present

## 2020-08-25 DIAGNOSIS — Z7901 Long term (current) use of anticoagulants: Secondary | ICD-10-CM | POA: Diagnosis not present

## 2020-08-25 DIAGNOSIS — D649 Anemia, unspecified: Secondary | ICD-10-CM | POA: Diagnosis not present

## 2020-08-25 DIAGNOSIS — I871 Compression of vein: Secondary | ICD-10-CM | POA: Diagnosis not present

## 2020-08-25 LAB — CBC WITH DIFFERENTIAL/PLATELET
Abs Immature Granulocytes: 0.04 10*3/uL (ref 0.00–0.07)
Basophils Absolute: 0 10*3/uL (ref 0.0–0.1)
Basophils Relative: 1 %
Eosinophils Absolute: 0.2 10*3/uL (ref 0.0–0.5)
Eosinophils Relative: 6 %
HCT: 27.1 % — ABNORMAL LOW (ref 39.0–52.0)
Hemoglobin: 9 g/dL — ABNORMAL LOW (ref 13.0–17.0)
Immature Granulocytes: 1 %
Lymphocytes Relative: 23 %
Lymphs Abs: 0.7 10*3/uL (ref 0.7–4.0)
MCH: 34.4 pg — ABNORMAL HIGH (ref 26.0–34.0)
MCHC: 33.2 g/dL (ref 30.0–36.0)
MCV: 103.4 fL — ABNORMAL HIGH (ref 80.0–100.0)
Monocytes Absolute: 0.3 10*3/uL (ref 0.1–1.0)
Monocytes Relative: 10 %
Neutro Abs: 1.6 10*3/uL — ABNORMAL LOW (ref 1.7–7.7)
Neutrophils Relative %: 59 %
Platelets: 198 10*3/uL (ref 150–400)
RBC: 2.62 MIL/uL — ABNORMAL LOW (ref 4.22–5.81)
RDW: 16.8 % — ABNORMAL HIGH (ref 11.5–15.5)
WBC: 2.8 10*3/uL — ABNORMAL LOW (ref 4.0–10.5)
nRBC: 0 % (ref 0.0–0.2)

## 2020-08-25 LAB — COMPREHENSIVE METABOLIC PANEL
ALT: 11 U/L (ref 0–44)
AST: 22 U/L (ref 15–41)
Albumin: 3.2 g/dL — ABNORMAL LOW (ref 3.5–5.0)
Alkaline Phosphatase: 82 U/L (ref 38–126)
Anion gap: 11 (ref 5–15)
BUN: 18 mg/dL (ref 6–20)
CO2: 24 mmol/L (ref 22–32)
Calcium: 8.8 mg/dL — ABNORMAL LOW (ref 8.9–10.3)
Chloride: 101 mmol/L (ref 98–111)
Creatinine, Ser: 1.25 mg/dL — ABNORMAL HIGH (ref 0.61–1.24)
GFR, Estimated: >60 mL/min (ref >60)
Glucose, Bld: 154 mg/dL — ABNORMAL HIGH (ref 70–99)
Potassium: 4.1 mmol/L (ref 3.5–5.1)
Sodium: 136 mmol/L (ref 135–145)
Total Bilirubin: 0.5 mg/dL (ref 0.3–1.2)
Total Protein: 6.4 g/dL — ABNORMAL LOW (ref 6.5–8.1)

## 2020-08-25 MED ORDER — HEPARIN SOD (PORK) LOCK FLUSH 100 UNIT/ML IV SOLN
500.0000 [IU] | Freq: Once | INTRAVENOUS | Status: AC
  Administered 2020-08-25: 500 [IU] via INTRAVENOUS
  Filled 2020-08-25: qty 5

## 2020-08-25 MED ORDER — SODIUM CHLORIDE 0.9% FLUSH
10.0000 mL | INTRAVENOUS | Status: DC | PRN
  Administered 2020-08-25: 10 mL via INTRAVENOUS
  Filled 2020-08-25: qty 10

## 2020-08-25 NOTE — Progress Notes (Signed)
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 normal in size. There is mild left ventricular hypertrophy. Left ventricular diastolic parameters were normal. Right Ventricle: The right ventricular size is mildly enlarged. No increase in right ventricular wall thickness. Right ventricular systolic function is normal. Tricuspid regurgitation signal is inadequate for assessing PA pressure. Left Atrium: Left atrial size was normal in size. Right Atrium: Right atrial size was normal in size. Pericardium: There is no evidence of pericardial effusion. Mitral Valve: The mitral valve is normal in structure. Trivial mitral valve regurgitation. No evidence of mitral valve stenosis. MV peak gradient, 3.5 mmHg. The mean mitral valve gradient is 2.0 mmHg. Tricuspid Valve: The tricuspid valve is normal in  structure. Tricuspid valve regurgitation is trivial. Aortic Valve: The aortic valve is tricuspid. Aortic valve regurgitation is not visualized. No aortic stenosis is present. Aortic valve mean gradient measures 6.0 mmHg. Aortic valve peak gradient measures 8.4 mmHg. Aortic valve area, by VTI measures 2.96  cm. Pulmonic Valve: The pulmonic valve was not well visualized. Pulmonic valve regurgitation is not visualized. No evidence of pulmonic stenosis. Aorta: The aortic root is normal in size and structure. Pulmonary Artery: The pulmonary artery is not well seen. Venous: The inferior vena cava is normal in size with greater than 50% respiratory variability, suggesting right atrial pressure of 3 mmHg. IAS/Shunts: The interatrial septum was not well visualized.  LEFT VENTRICLE PLAX 2D LVIDd:         4.40 cm  Diastology LVIDs:         2.70 cm  LV e' medial:    9.57 cm/s LV PW:         1.20 cm  LV E/e' medial:  9.4 LV IVS:        1.14 cm  LV e' lateral:   8.92 cm/s LVOT diam:     2.20 cm  LV E/e' lateral: 10.0 LV SV:         81 LV SV Index:   34 LVOT Area:     3.80 cm  RIGHT VENTRICLE RV Basal diam:  5.20 cm LEFT ATRIUM             Index       RIGHT ATRIUM           Index LA diam:        3.10 cm 1.31 cm/m  RA Area:     11.40 cm LA Vol (A2C):   73.8 ml 31.22 ml/m RA Volume:   22.60 ml  9.56 ml/m LA Vol (A4C):   57.1 ml 24.15 ml/m LA Biplane Vol: 67.3 ml 28.47 ml/m  AORTIC VALVE                    PULMONIC VALVE AV Area (Vmax):    3.04 cm     PV Vmax:       1.16 m/s AV Area (Vmean):   2.71 cm     PV Vmean:      76.700 cm/s AV Area (VTI):     2.96 cm     PV VTI:        0.177 m AV Vmax:           145.00 cm/s  PV Peak grad:  5.4 mmHg AV Vmean:          117.000 cm/s PV Mean grad:  3.0 mmHg AV VTI:            0.275 m AV Peak Grad:      8.4 mmHg AV Mean Grad:      6.0 mmHg LVOT Vmax:         116.00 cm/s LVOT Vmean:        83.300 cm/s LVOT VTI:          0.214 m LVOT/AV VTI ratio: 0.78  AORTA Ao Root diam: 3.70 cm MITRAL VALVE MV Area (PHT): 4.17 cm    SHUNTS MV Area VTI:   3.54 cm    Systemic VTI:  0.21 m MV Peak grad:  3.5 mmHg    Systemic Diam: 2.20 cm MV Mean grad:  2.0 mmHg MV Vmax:       0.93 m/s MV Vmean:  61.1 cm/s MV Decel Time: 182 msec MV E velocity: 89.50 cm/s MV A velocity: 67.70 cm/s MV E/A ratio:  1.32  Harrell Gave End MD Electronically signed by Nelva Bush MD Signature Date/Time: 08/16/2020/5:38:37 PM    Final     ASSESSMENT & PLAN:   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 S/p evaluation with Dr.Bennett; awaiting evaluation with ENT at [place]. Will discuss with Dr.Bennettt.   # Will plan to get a PET scan in last week of May 2022 [~10 week post therapy]; will order PET today.   # Acute PE bilateral [April 10th, 2022]-currently on Eliquis; discussed re: 6 months [provoked sec to chemo].  Clinically stable.  Understands that he might need to bridge with Lovenox if and when neck surgery is planned.  #Anemia-Hb 8.5- multifactorial secondary recent chemotherapy;monitor closely on eliquis.    # Dehydration/acute renal failure--creatinine 1.6  [baseline 1.0-1.2]- improved; Hold off IVFs.      # Mediport/IV Access: STABLE.   # DISPOSITION: # NO Infusions today; de-access # follow up in 5 weeks MD; labs/port flush- cbc/cmp/mag;1-2 days PET prior-- Dr.B   All questions were answered. The patient knows to call the clinic with any problems, questions or concerns.   Cammie Sickle, MD 08/25/2020 1:00 PM

## 2020-08-25 NOTE — Assessment & Plan Note (Addendum)
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 S/p evaluation with Dr.Bennett; awaiting evaluation with ENT at [place]. Will discuss with Dr.Bennettt.   # Will plan to get a PET scan in last week of May 2022 [~10 week post therapy]; will order PET today.   # Acute PE bilateral [April 10th, 2022]-currently on Eliquis; discussed re: 6 months [provoked sec to chemo].  Clinically stable.  Understands that he might need to bridge with Lovenox if and when neck surgery is planned.  #Anemia-Hb 8.5- multifactorial secondary recent chemotherapy;monitor closely on eliquis.    # Dehydration/acute renal failure--creatinine 1.6  [baseline 1.0-1.2]- improved; Hold off IVFs.      # Mediport/IV Access: STABLE.   # DISPOSITION: # NO Infusions today; de-access # follow up in 5 weeks MD; labs/port flush- cbc/cmp/mag;1-2 days PET prior-- Dr.B

## 2020-08-27 MED FILL — ELIQUIS 5 MG TABLET: 30 days supply | Qty: 60 | Fill #0

## 2020-08-31 ENCOUNTER — Encounter: Payer: Self-pay | Admitting: Internal Medicine

## 2020-09-06 ENCOUNTER — Other Ambulatory Visit: Payer: Self-pay

## 2020-09-06 ENCOUNTER — Inpatient Hospital Stay: Payer: 59 | Attending: Internal Medicine

## 2020-09-06 DIAGNOSIS — Z806 Family history of leukemia: Secondary | ICD-10-CM | POA: Insufficient documentation

## 2020-09-06 DIAGNOSIS — R911 Solitary pulmonary nodule: Secondary | ICD-10-CM | POA: Insufficient documentation

## 2020-09-06 DIAGNOSIS — C01 Malignant neoplasm of base of tongue: Secondary | ICD-10-CM | POA: Insufficient documentation

## 2020-09-06 DIAGNOSIS — I2699 Other pulmonary embolism without acute cor pulmonale: Secondary | ICD-10-CM | POA: Insufficient documentation

## 2020-09-06 DIAGNOSIS — Z8701 Personal history of pneumonia (recurrent): Secondary | ICD-10-CM | POA: Insufficient documentation

## 2020-09-06 DIAGNOSIS — Z87891 Personal history of nicotine dependence: Secondary | ICD-10-CM | POA: Insufficient documentation

## 2020-09-06 DIAGNOSIS — Z7901 Long term (current) use of anticoagulants: Secondary | ICD-10-CM | POA: Insufficient documentation

## 2020-09-06 DIAGNOSIS — D649 Anemia, unspecified: Secondary | ICD-10-CM | POA: Insufficient documentation

## 2020-09-06 NOTE — Progress Notes (Signed)
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  Patient has completed chemotherapy and radiation. Noted recent hospitalization for PE.    Spoke with patient via phone.  Patient reports that appetite is much better.  Mucus has cleared but now with dry mouth.  Reports taste is back about 75-80%.  Has been eating seafood (oysters and shrimp), meats (chicken, [place], less beef), asian type foods with broth, sandwiches dipped in broth soup.  Drinking some protein shakes 1-2 times per week.  Eating fruits and vegetables as well.    Patient reports feeling better overall but legs are weak.    Medications: reviewed  Labs: reviewed  Anthropometrics:   Weight 268 lb on 4/20  275 lb on 4/1 285 lb on 2/23 296 lb on 2/9 308 lb on 2/2 311 lb on 1/16 343 on 12/28 340 lb on 12/3   NUTRITION DIAGNOSIS: Predicted suboptimal energy intake improved   INTERVENTION:  Patient plans to call CARE program and get started with to improve physical condition Encouraged patient to continue with lean protein foods and well balanced diet.  RD available as needed    NEXT VISIT: no follow-up  Joshua Bray, [place], [place] Registered Dietitian [phone] (mobile)

## 2020-09-15 MED FILL — DEXAMETHASONE 2 MG TABLET: 30 days supply | Qty: 30 | Fill #1

## 2020-09-18 MED FILL — CHLORHEXIDINE 0.12% RINSE: 10 days supply | Qty: 473 | Fill #1

## 2020-09-20 ENCOUNTER — Other Ambulatory Visit: Payer: Self-pay

## 2020-09-20 ENCOUNTER — Ambulatory Visit
Admission: RE | Admit: 2020-09-20 | Discharge: 2020-09-20 | Disposition: A | Discharge status: Home / Self Care | Payer: 59 | Source: Ambulatory Visit | Attending: Internal Medicine | Admitting: Internal Medicine

## 2020-09-20 DIAGNOSIS — C01 Malignant neoplasm of base of tongue: Secondary | ICD-10-CM | POA: Diagnosis present

## 2020-09-20 DIAGNOSIS — I700 Atherosclerosis of aorta: Secondary | ICD-10-CM | POA: Diagnosis not present

## 2020-09-20 DIAGNOSIS — R590 Localized enlarged lymph nodes: Secondary | ICD-10-CM | POA: Diagnosis not present

## 2020-09-20 LAB — GLUCOSE, CAPILLARY: Glucose-Capillary: 114 mg/dL — ABNORMAL HIGH (ref 70–99)

## 2020-09-20 MED ORDER — FLUDEOXYGLUCOSE F - 18 (FDG) INJECTION
14.9000 | Freq: Once | INTRAVENOUS | Status: AC | PRN
  Administered 2020-09-20: 14.32 via INTRAVENOUS

## 2020-09-22 ENCOUNTER — Telehealth: Payer: Self-pay | Admitting: *Deleted

## 2020-09-22 ENCOUNTER — Encounter: Payer: Self-pay | Admitting: Internal Medicine

## 2020-09-22 MED ORDER — APIXABAN 5 MG PO TABS: 5.0000 mg | ORAL_TABLET | Freq: Two times a day (BID) | ORAL | 4 refills | Status: DC

## 2020-09-22 NOTE — Telephone Encounter (Signed)
Patient needed RF on eliquis. He will be out tomorrow. RF submitted per directions of Dr. Rogue Bussing. Pt will need to stay on eliquis for a total of 6 months (from starting the script in April 2021). Also reviewed the pet scan results per patient's request. Explained to patient that pet scan shows a good response to treatment. No new signs of cancer. He gave verbal understanding to of the plan of care. He was instructed to keep his apt as scheduled with Dr. Rogue Bussing on 5/25

## 2020-09-23 MED FILL — ELIQUIS 5 MG TABLET: 30 days supply | Qty: 60 | Fill #0

## 2020-09-28 ENCOUNTER — Telehealth: Payer: Self-pay | Admitting: Internal Medicine

## 2020-09-28 NOTE — Telephone Encounter (Signed)
On 5/23-I tried to reach patient regarding the results of the PET scan' and to check on the appointment at [facility] ENT.  Unable to leave [place] voicemail.  GB

## 2020-09-29 ENCOUNTER — Inpatient Hospital Stay: Payer: 59

## 2020-09-29 ENCOUNTER — Other Ambulatory Visit: Payer: Self-pay

## 2020-09-29 ENCOUNTER — Inpatient Hospital Stay (HOSPITAL_BASED_OUTPATIENT_CLINIC_OR_DEPARTMENT_OTHER): Payer: 59 | Admitting: Internal Medicine

## 2020-09-29 ENCOUNTER — Encounter: Payer: Self-pay | Admitting: Internal Medicine

## 2020-09-29 DIAGNOSIS — Z806 Family history of leukemia: Secondary | ICD-10-CM | POA: Diagnosis not present

## 2020-09-29 DIAGNOSIS — Z87891 Personal history of nicotine dependence: Secondary | ICD-10-CM | POA: Diagnosis not present

## 2020-09-29 DIAGNOSIS — C01 Malignant neoplasm of base of tongue: Secondary | ICD-10-CM | POA: Diagnosis present

## 2020-09-29 DIAGNOSIS — Z7901 Long term (current) use of anticoagulants: Secondary | ICD-10-CM | POA: Diagnosis not present

## 2020-09-29 DIAGNOSIS — Z95828 Presence of other vascular implants and grafts: Secondary | ICD-10-CM

## 2020-09-29 DIAGNOSIS — D649 Anemia, unspecified: Secondary | ICD-10-CM | POA: Diagnosis not present

## 2020-09-29 DIAGNOSIS — Z8701 Personal history of pneumonia (recurrent): Secondary | ICD-10-CM | POA: Diagnosis not present

## 2020-09-29 DIAGNOSIS — I2699 Other pulmonary embolism without acute cor pulmonale: Secondary | ICD-10-CM | POA: Diagnosis not present

## 2020-09-29 DIAGNOSIS — R911 Solitary pulmonary nodule: Secondary | ICD-10-CM | POA: Diagnosis not present

## 2020-09-29 LAB — COMPREHENSIVE METABOLIC PANEL
ALT: 13 U/L (ref 0–44)
AST: 16 U/L (ref 15–41)
Albumin: 3.5 g/dL (ref 3.5–5.0)
Alkaline Phosphatase: 71 U/L (ref 38–126)
Anion gap: 11 (ref 5–15)
BUN: 28 mg/dL — ABNORMAL HIGH (ref 6–20)
CO2: 25 mmol/L (ref 22–32)
Calcium: 8.8 mg/dL — ABNORMAL LOW (ref 8.9–10.3)
Chloride: 100 mmol/L (ref 98–111)
Creatinine, Ser: 1.14 mg/dL (ref 0.61–1.24)
GFR, Estimated: >60 mL/min (ref >60)
Glucose, Bld: 137 mg/dL — ABNORMAL HIGH (ref 70–99)
Potassium: 3.9 mmol/L (ref 3.5–5.1)
Sodium: 136 mmol/L (ref 135–145)
Total Bilirubin: 0.6 mg/dL (ref 0.3–1.2)
Total Protein: 6.2 g/dL — ABNORMAL LOW (ref 6.5–8.1)

## 2020-09-29 LAB — CBC WITH DIFFERENTIAL/PLATELET
Abs Immature Granulocytes: 0.05 10*3/uL (ref 0.00–0.07)
Basophils Absolute: 0.1 10*3/uL (ref 0.0–0.1)
Basophils Relative: 1 %
Eosinophils Absolute: 0.2 10*3/uL (ref 0.0–0.5)
Eosinophils Relative: 5 %
HCT: 37.9 % — ABNORMAL LOW (ref 39.0–52.0)
Hemoglobin: 12.7 g/dL — ABNORMAL LOW (ref 13.0–17.0)
Immature Granulocytes: 1 %
Lymphocytes Relative: 26 %
Lymphs Abs: 1.1 10*3/uL (ref 0.7–4.0)
MCH: 34.2 pg — ABNORMAL HIGH (ref 26.0–34.0)
MCHC: 33.5 g/dL (ref 30.0–36.0)
MCV: 102.2 fL — ABNORMAL HIGH (ref 80.0–100.0)
Monocytes Absolute: 0.5 10*3/uL (ref 0.1–1.0)
Monocytes Relative: 10 %
Neutro Abs: 2.6 10*3/uL (ref 1.7–7.7)
Neutrophils Relative %: 57 %
Platelets: 150 10*3/uL (ref 150–400)
RBC: 3.71 MIL/uL — ABNORMAL LOW (ref 4.22–5.81)
RDW: 13.1 % (ref 11.5–15.5)
WBC: 4.4 10*3/uL (ref 4.0–10.5)
nRBC: 0 % (ref 0.0–0.2)

## 2020-09-29 LAB — MAGNESIUM: Magnesium: 1.9 mg/dL (ref 1.7–2.4)

## 2020-09-29 MED ORDER — GABAPENTIN 300 MG PO CAPS: 300.0000 mg | ORAL_CAPSULE | Freq: Every day | ORAL | 3 refills | Status: DC

## 2020-09-29 MED ORDER — HEPARIN SOD (PORK) LOCK FLUSH 100 UNIT/ML IV SOLN
500.0000 [IU] | Freq: Once | INTRAVENOUS | Status: DC
Start: 2020-09-29 — End: 2020-09-30
  Filled 2020-09-29: qty 5

## 2020-09-29 MED ORDER — HEPARIN SOD (PORK) LOCK FLUSH 100 UNIT/ML IV SOLN
500.0000 [IU] | Freq: Once | INTRAVENOUS | Status: AC
  Administered 2020-09-29: 500 [IU] via INTRAVENOUS
  Filled 2020-09-29: qty 5

## 2020-09-29 MED ORDER — SODIUM CHLORIDE 0.9% FLUSH
10.0000 mL | INTRAVENOUS | Status: DC | PRN
  Administered 2020-09-29: 10 mL via INTRAVENOUS
  Filled 2020-09-29: qty 10

## 2020-09-29 MED FILL — GABAPENTIN 300 MG CAPSULE: 30 days supply | Qty: 60 | Fill #0

## 2020-09-29 NOTE — Progress Notes (Signed)
Patient here for follow up. 

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
## 2020-09-29 NOTE — Assessment & Plan Note (Addendum)
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 MAY 16th, 2022 [s/p 11 weeks-chemo-RT ]-significant partial response noted-primary disease; bilateral cervical lymphadenopathy; however low-level residual metabolic uptake noted  #Had a long discussion with patient regarding-the results of the above PET scan. I  discussed options of-proceeding with bilateral neck dissection  Vs repeating PET scan in 1 to 2 months.  Await ENT evaluation.  Recommend patient take PET scan on CD to appointment.   # Patient reluctant with follow-up with ENT/as not interested in any surgery at this time.  However I strongly recommend patient keep his appointment with ENT physician at [facility] [05/26].  Discussed with Dr. Donella Stade; Dr. Richardson Landry.   # Bil PN UE/LE-- G-2-3; recommend gabapentin 300 mg nightly for 1 week; if tolerating then increase the dose to twice a day.  Discussed the potential side effects including but not limited to fatigue/dizziness.   # LLL nodule-likely secondary to pneumonia in April 2022; monitor for now clinically not suggestive of malignancy.  # Acute PE bilateral [April 10th, 2022]-currently on Eliquis; discussed re: 6 months [provoked sec to chemo].  STABLE.   #Anemia-Hb 12.7- Improving- monitof ro now.   # Mediport/IV Access: STABLE; port flush on 5/25.   # DISPOSITION:  # NO Infusions today; de-access # follow up in 4 weeks MD; labs- cbc/cmp/mag;- Dr.B  # I reviewed the blood work- with the patient in detail; also reviewed the imaging independently [as summarized above]; and with the patient in detail.   # 40 minutes face-to-face with the patient discussing the above plan of care; more than 50% of time spent on prognosis/ natural history; counseling and coordination.

## 2020-09-29 NOTE — Patient Instructions (Signed)
#   recommend taking CD from radiology with PET scans from Dec, 2021; and from May 2022.

## 2020-09-30 ENCOUNTER — Telehealth: Payer: Self-pay | Admitting: Internal Medicine

## 2020-09-30 DIAGNOSIS — C01 Malignant neoplasm of base of tongue: Secondary | ICD-10-CM

## 2020-09-30 NOTE — Telephone Encounter (Signed)
On 5/26-spoke to patient regarding recent visit with ENT surgeon at [facility].  As per the patient, ENT recommends follow-up PET scan in 4 weeks. Holding off surgery at this time. Ok with care program.   Please schedule the PET scan prior to next MD visit.   GB

## 2020-10-12 ENCOUNTER — Encounter: Payer: 59 | Attending: Physician Assistant

## 2020-10-12 ENCOUNTER — Other Ambulatory Visit: Payer: Self-pay

## 2020-10-12 VITALS — Ht 70.5 in | Wt 290.5 lb

## 2020-10-12 DIAGNOSIS — C01 Malignant neoplasm of base of tongue: Secondary | ICD-10-CM | POA: Insufficient documentation

## 2020-10-12 NOTE — Progress Notes (Signed)
CARE Daily Session Note  Patient Details  Name: Joshua Bray MRN: [id number] Date of Birth: 12/27/1960 Referring Provider:    Encounter Date: 10/12/2020  Check In:  Session Check In - 10/12/20 1414      Check-In   Supervising physician immediately available to respond to emergencies See telemetry face sheet for immediately available ER MD    Location [facility]    Staff Present Alberteen Sam, MA, RCEP, CCRP, Marylynn Pearson, MS, ASCM CEP, Exercise Physiologist    Virtual Visit No    Medication changes reported     No    Fall or balance concerns reported    No    Warm-up and Cool-down Not performed (comment)   6MWT and Gym Orientation   Resistance Training Performed No   Gym Orientation   VAD Patient? No               6 Minute Walk    Row Name 10/12/20 1417         6 Minute Walk   Phase Initial     Distance 1220 feet     Walk Time 6 minutes     # of Rest Breaks 0     MPH 2.31     METS 2.83     RPE 10     Perceived Dyspnea  1     VO2 Peak 9.91     Symptoms Yes (comment)     Comments R. leg pain 3/10     Resting HR 78 bpm     Resting BP 140/76     Resting Oxygen Saturation  96 %     Exercise Oxygen Saturation  during 6 min walk 94 %     Max Ex. HR 105 bpm     Max Ex. BP 162/74     2 Minute Post BP 144/78              Initial Exercise Prescription - 10/12/20 1400      Date of Initial Exercise RX and Referring Provider   Date 10/12/20    Referring Provider Charlaine Dalton MD      Recumbant Bike   Level 2    RPM 60    Minutes 15    METs 2.8      NuStep   Level 2    SPM 80    Minutes 15    METs 2.8      REL-XR   Level 1    Speed 50    Minutes 15    METs 2.8      Track   Laps 35    Minutes 15    METs 2.9      Prescription Details   Frequency (times per week) 2    Duration Progress to 30 minutes of continuous aerobic without signs/symptoms of physical distress      Intensity   THRR 40-80% of Max Heartrate  111-144    Ratings of Perceived Exertion 11-13    Perceived Dyspnea 0-4      Progression   Progression Continue to progress workloads to maintain intensity without signs/symptoms of physical distress.      Resistance Training   Training Prescription Yes    Weight 3 lb    Reps 10-15           Social History   Tobacco Use  Smoking Status Former Smoker  . Years: 4.00  . Quit date: 03/10/1990  . Years since quitting: 30.6  Smokeless Tobacco Never Used    Goals Met:  Proper associated with RPD/PD & O2 Sat Exercise tolerated well No report of cardiac concerns or symptoms  Goals Unmet:  Not Applicable  Comments: First full day of orientation. All starting workloads were established based on the results of the 6 minute walk test done at initial orientation visit.  The plan for exercise progression was also introduced and progression will be customized based on patient's performance and goals.  Encouraged patient to wear compression stockings during exercise.  Dr. Emily Filbert is Medical Director for [facility].  Dr. Ottie Glazier is Medical Director for [facility].

## 2020-10-14 ENCOUNTER — Other Ambulatory Visit: Payer: Self-pay

## 2020-10-14 DIAGNOSIS — C01 Malignant neoplasm of base of tongue: Secondary | ICD-10-CM | POA: Diagnosis present

## 2020-10-14 LAB — GLUCOSE, CAPILLARY
Glucose-Capillary: 104 mg/dL — ABNORMAL HIGH (ref 70–99)
Glucose-Capillary: 94 mg/dL (ref 70–99)

## 2020-10-14 NOTE — Progress Notes (Signed)
Daily Session Note  Patient Details  Name: Dannell Gortney MRN: [id number] Date of Birth: [birth year] Referring Provider:   April Manson [facility] & Exercise from 10/12/2020 in [facility]  Referring Provider Charlaine Dalton MD       Encounter Date: 10/14/2020  Check In:  Session Check In - 10/14/20 1318       Check-In   Supervising physician immediately available to respond to emergencies See telemetry face sheet for immediately available ER MD    Location [facility]    Staff Present Birdie Sons, MPA, RN;Melissa Caiola RDN, Rowe Pavy, BA, ACSM CEP, Exercise Physiologist    Virtual Visit No    Medication changes reported     No    Fall or balance concerns reported    No    Warm-up and Cool-down Performed on first and last piece of equipment    Resistance Training Performed Yes    VAD Patient? No    PAD/SET Patient? No      Pain Assessment   Currently in Pain? No/denies                Social History   Tobacco Use  Smoking Status Former   Years: 4.00   Pack years: 0.00   Types: Cigarettes   Quit date: 03/10/1990   Years since quitting: 30.6  Smokeless Tobacco Never    Goals Met:  Independence with exercise equipment Exercise tolerated well No report of cardiac concerns or symptoms Strength training completed today  Goals Unmet:  Not Applicable  Comments: Pt able to follow exercise prescription today without complaint.  Will continue to monitor for progression.    Dr. Emily Filbert is Medical Director for [facility].  Dr. Ottie Glazier is Medical Director for [facility] Pul[facility].

## 2020-10-15 MED FILL — METFORMIN HCL 500 MG TABLET: 90 days supply | Qty: 180 | Fill #0

## 2020-10-17 ENCOUNTER — Encounter: Payer: Self-pay | Admitting: Internal Medicine

## 2020-10-18 ENCOUNTER — Telehealth: Payer: Self-pay | Admitting: *Deleted

## 2020-10-18 ENCOUNTER — Other Ambulatory Visit: Payer: Self-pay | Admitting: *Deleted

## 2020-10-18 MED ORDER — DEXAMETHASONE 2 MG PO TABS: 2.0000 mg | ORAL_TABLET | Freq: Every day | ORAL | 1 refills | Status: DC

## 2020-10-18 MED FILL — DEXAMETHASONE 2 MG TABLET: 30 days supply | Qty: 30 | Fill #0

## 2020-10-18 NOTE — Telephone Encounter (Signed)
Manus Rudd, RN  Cammie Sickle, MD; Gloris Ham, RN Called patient to schedule appointment with [facility]. He reports that he is no longer worried, denies H/A, blurred vision, mental status. He will call if hiscondition changes.

## 2020-10-18 NOTE — Telephone Encounter (Signed)
-----   Message from Manus Rudd, RN sent at 10/18/2020  8:57 AM EDT ----- Called patient to schedule appointment with [facility]. He reports that he is no longer worried, denies H/A, blurred vision, mental status. He will call if his condition changes.

## 2020-10-25 MED FILL — ELIQUIS 5 MG TABLET: 30 days supply | Qty: 60 | Fill #1

## 2020-10-26 ENCOUNTER — Ambulatory Visit: Payer: 59

## 2020-10-27 ENCOUNTER — Inpatient Hospital Stay: Payer: 59 | Admitting: Internal Medicine

## 2020-10-27 ENCOUNTER — Inpatient Hospital Stay: Payer: 59

## 2020-11-02 ENCOUNTER — Telehealth: Payer: Self-pay

## 2020-11-02 NOTE — Telephone Encounter (Signed)
Spoke with patient, holding off for 2 weeks for CARE due to appointments and lack of time. Stressed importance coming 2x/week to benefit from program. Patient plan to come back 7/12 and if still lack of attendance, will discuss discharge. Patient understood.

## 2020-11-04 ENCOUNTER — Other Ambulatory Visit: Payer: Self-pay

## 2020-11-04 ENCOUNTER — Ambulatory Visit
Admission: RE | Admit: 2020-11-04 | Discharge: 2020-11-04 | Disposition: A | Discharge status: Home / Self Care | Payer: 59 | Source: Ambulatory Visit | Attending: Internal Medicine | Admitting: Internal Medicine

## 2020-11-04 DIAGNOSIS — I517 Cardiomegaly: Secondary | ICD-10-CM | POA: Diagnosis present

## 2020-11-04 DIAGNOSIS — I2510 Atherosclerotic heart disease of native coronary artery without angina pectoris: Secondary | ICD-10-CM | POA: Insufficient documentation

## 2020-11-04 DIAGNOSIS — I700 Atherosclerosis of aorta: Secondary | ICD-10-CM | POA: Diagnosis not present

## 2020-11-04 DIAGNOSIS — C01 Malignant neoplasm of base of tongue: Secondary | ICD-10-CM

## 2020-11-04 LAB — GLUCOSE, CAPILLARY: Glucose-Capillary: 133 mg/dL — ABNORMAL HIGH (ref 70–99)

## 2020-11-04 MED ORDER — FLUDEOXYGLUCOSE F - 18 (FDG) INJECTION
15.5000 | Freq: Once | INTRAVENOUS | Status: AC | PRN
  Administered 2020-11-04: 15.61 via INTRAVENOUS

## 2020-11-12 ENCOUNTER — Inpatient Hospital Stay (HOSPITAL_BASED_OUTPATIENT_CLINIC_OR_DEPARTMENT_OTHER): Payer: 59 | Admitting: Internal Medicine

## 2020-11-12 ENCOUNTER — Inpatient Hospital Stay: Payer: 59 | Attending: Internal Medicine

## 2020-11-12 ENCOUNTER — Encounter: Payer: Self-pay | Admitting: Internal Medicine

## 2020-11-12 ENCOUNTER — Other Ambulatory Visit: Payer: Self-pay

## 2020-11-12 DIAGNOSIS — R350 Frequency of micturition: Secondary | ICD-10-CM | POA: Diagnosis not present

## 2020-11-12 DIAGNOSIS — C01 Malignant neoplasm of base of tongue: Secondary | ICD-10-CM | POA: Insufficient documentation

## 2020-11-12 DIAGNOSIS — I10 Essential (primary) hypertension: Secondary | ICD-10-CM

## 2020-11-12 DIAGNOSIS — Z7901 Long term (current) use of anticoagulants: Secondary | ICD-10-CM | POA: Insufficient documentation

## 2020-11-12 DIAGNOSIS — I2699 Other pulmonary embolism without acute cor pulmonale: Secondary | ICD-10-CM | POA: Diagnosis not present

## 2020-11-12 DIAGNOSIS — Z87891 Personal history of nicotine dependence: Secondary | ICD-10-CM | POA: Insufficient documentation

## 2020-11-12 LAB — CBC WITH DIFFERENTIAL/PLATELET
Abs Immature Granulocytes: 0.07 10*3/uL (ref 0.00–0.07)
Basophils Absolute: 0 10*3/uL (ref 0.0–0.1)
Basophils Relative: 1 %
Eosinophils Absolute: 0.2 10*3/uL (ref 0.0–0.5)
Eosinophils Relative: 5 %
HCT: 40.9 % (ref 39.0–52.0)
Hemoglobin: 14.2 g/dL (ref 13.0–17.0)
Immature Granulocytes: 1 %
Lymphocytes Relative: 24 %
Lymphs Abs: 1.2 10*3/uL (ref 0.7–4.0)
MCH: 33.3 pg (ref 26.0–34.0)
MCHC: 34.7 g/dL (ref 30.0–36.0)
MCV: 96 fL (ref 80.0–100.0)
Monocytes Absolute: 0.4 10*3/uL (ref 0.1–1.0)
Monocytes Relative: 9 %
Neutro Abs: 3.1 10*3/uL (ref 1.7–7.7)
Neutrophils Relative %: 60 %
Platelets: 157 10*3/uL (ref 150–400)
RBC: 4.26 MIL/uL (ref 4.22–5.81)
RDW: 13.2 % (ref 11.5–15.5)
WBC: 5 10*3/uL (ref 4.0–10.5)
nRBC: 0 % (ref 0.0–0.2)

## 2020-11-12 LAB — COMPREHENSIVE METABOLIC PANEL
ALT: 16 U/L (ref 0–44)
AST: 17 U/L (ref 15–41)
Albumin: 3.4 g/dL — ABNORMAL LOW (ref 3.5–5.0)
Alkaline Phosphatase: 74 U/L (ref 38–126)
Anion gap: 9 (ref 5–15)
BUN: 24 mg/dL — ABNORMAL HIGH (ref 6–20)
CO2: 27 mmol/L (ref 22–32)
Calcium: 8.8 mg/dL — ABNORMAL LOW (ref 8.9–10.3)
Chloride: 102 mmol/L (ref 98–111)
Creatinine, Ser: 1.25 mg/dL — ABNORMAL HIGH (ref 0.61–1.24)
GFR, Estimated: >60 mL/min (ref >60)
Glucose, Bld: 167 mg/dL — ABNORMAL HIGH (ref 70–99)
Potassium: 3.9 mmol/L (ref 3.5–5.1)
Sodium: 138 mmol/L (ref 135–145)
Total Bilirubin: 0.9 mg/dL (ref 0.3–1.2)
Total Protein: 6.1 g/dL — ABNORMAL LOW (ref 6.5–8.1)

## 2020-11-12 LAB — MAGNESIUM: Magnesium: 2.1 mg/dL (ref 1.7–2.4)

## 2020-11-12 MED ORDER — AMLODIPINE BESYLATE 10 MG PO TABS: 10.0000 mg | ORAL_TABLET | Freq: Every day | ORAL | 0 refills | Status: DC

## 2020-11-12 MED FILL — AMLODIPINE BESYLATE 10 MG TAB: 90 days supply | Qty: 90 | Fill #0

## 2020-11-12 NOTE — Progress Notes (Signed)
Survivorship Care Plan visit completed.  Treatment summary reviewed and given to patient.  ASCO answers booklet reviewed and given to patient.  CARE program and Cancer Transitions discussed with patient along with other resources cancer center offers to patients and caregivers.  Patient verbalized understanding.    
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
## 2020-11-12 NOTE — Assessment & Plan Note (Addendum)
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 MAY 16th, 2022 [s/p 11 weeks-chemo-RT ]-significant partial response noted-primary disease; bilateral cervical lymphadenopathy; however low-level residual metabolic uptake noted. July 2022-PET scan mild further reduction in activity in the lymph nodes in the neck. The dominant left cervical lymph node is also mildly reduced in size compared to prior. No recurrence of the tongue base mass.  #Patient has been evaluated by Dr. Rutherford Nail at [facility], ENT-given the clinical response plan is to hold off any radical surgery.  Awaiting needle biopsy [to be ordered by Dr. Rutherford Nail; patient will call the surgeon's office to confirm.  #Frequent urination/urinary incontinence-question BPH; discussed regarding referral to urology wants to hold off for now  #Incidental uptake noted in the anal region-reviewed the PET scan; no recent colonoscopy.  Patient wants to wait to finish above work-up.  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  STABLE.   # Mediport/IV Access: STABLE; port flush on 5/25.   # DISPOSITION:  # follow up in 2 months MD; labs- cbc/cmp/mag;- Dr.B  # I reviewed the blood work- with the patient in detail; also reviewed the imaging independently [as summarized above]; and with the patient in detail.

## 2020-11-12 NOTE — Progress Notes (Signed)
Pt reports stress incontinence. Patient would like to discuss referral to urology.  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 He would like to discuss acupunture as an option to treat neuropathy.  He stopped going to the care program due to numerous apts in [place]. He will call Marcene Brawn in [place] when he is ready to start back.  He would like a RF on amlodipine. His pcp left [facility] and he has been unable to establish there with the new provider.

## 2020-11-14 ENCOUNTER — Encounter: Payer: Self-pay | Admitting: Internal Medicine

## 2020-11-15 ENCOUNTER — Telehealth: Payer: Self-pay | Admitting: *Deleted

## 2020-11-15 NOTE — Telephone Encounter (Signed)
[facility] sent our IR scheduling dept an order to schedule pt a needle biopsy at [facility]. Patient elected to have the procedure at [facility].  Patient is on Eliquis and needs medical clearance to stop the Eliquis.  Dr. Jacinto Reap- please advise.

## 2020-11-16 ENCOUNTER — Telehealth: Payer: Self-pay | Admitting: Internal Medicine

## 2020-11-16 ENCOUNTER — Telehealth: Payer: Self-pay

## 2020-11-16 ENCOUNTER — Encounter: Payer: 59 | Attending: Physician Assistant

## 2020-11-16 DIAGNOSIS — C01 Malignant neoplasm of base of tongue: Secondary | ICD-10-CM | POA: Insufficient documentation

## 2020-11-16 NOTE — Telephone Encounter (Signed)
Contacted pt and informed him of Dr. Sharmaine Base recommendation prior to his biopsy. Pt can stop Eliquis three days prior to procedure, and restart back one day after procedure. Pt is made aware and understands instructions. Wanted to know if biopsy was scheduled, as of right now procedure has not been scheduled.

## 2020-11-16 NOTE — Telephone Encounter (Signed)
Clearance to come off Eliquis prior to biopsy:  Patient has been on anticoagulation for his bilateral PE for the last 3 months.  I think it is reasonable for the patient to stop Eliquis 3 days prior to the procedure; Eliquis can be restarted back 1 day after the procedure.  GB ---------------------------------------------------------   H/K-please inform patient of above recommendations/and schedule biopsy as ordered by Dr. Wilmer Floor med.

## 2020-11-16 NOTE — Telephone Encounter (Signed)
Spoke with patient 1616 on 11/16/20. Pt made aware of the Eliquis instructions and he read the instructions back. He will wait for the bx date to be posted on his mychart.

## 2020-11-18 ENCOUNTER — Other Ambulatory Visit: Payer: Self-pay | Admitting: Otolaryngology

## 2020-11-18 DIAGNOSIS — C01 Malignant neoplasm of base of tongue: Secondary | ICD-10-CM

## 2020-11-19 ENCOUNTER — Other Ambulatory Visit: Payer: Self-pay | Admitting: Physician Assistant

## 2020-11-19 NOTE — Progress Notes (Signed)
Patient on schedule for Cervical LN biopsy 11/22/2020, called and spoke with patient on phone with instructions given. Since receiving sedation for core biopsy, arrive @ 1230, NPO after 0630, and driver post procedure/recovery/discharge. Stated understanding.

## 2020-11-22 ENCOUNTER — Other Ambulatory Visit: Payer: Self-pay

## 2020-11-22 ENCOUNTER — Ambulatory Visit
Admission: RE | Admit: 2020-11-22 | Discharge: 2020-11-22 | Disposition: A | Discharge status: Home / Self Care | Payer: 59 | Source: Ambulatory Visit | Attending: Otolaryngology | Admitting: Otolaryngology

## 2020-11-22 ENCOUNTER — Telehealth: Payer: Self-pay

## 2020-11-22 ENCOUNTER — Telehealth: Payer: Self-pay | Admitting: *Deleted

## 2020-11-22 DIAGNOSIS — C01 Malignant neoplasm of base of tongue: Secondary | ICD-10-CM

## 2020-11-22 DIAGNOSIS — R221 Localized swelling, mass and lump, neck: Secondary | ICD-10-CM | POA: Diagnosis present

## 2020-11-22 DIAGNOSIS — I898 Other specified noninfective disorders of lymphatic vessels and lymph nodes: Secondary | ICD-10-CM | POA: Insufficient documentation

## 2020-11-22 NOTE — Telephone Encounter (Signed)
Reminder - Patient had his u/s guided biopsy today. We will need to review path reports in a few days.

## 2020-11-22 NOTE — Telephone Encounter (Signed)
Spoke with patient regarding CARE. Patient reports he has a lot of things going on and does not feel he can commit to the program at this time. Will discharge patient from CARE. Advised patient he will need to get a new referral sent over from his provider if he decided to come back. Encouraged to reach out with any questions and contact doctor when ready and cleared to exercise. Patient understands.

## 2020-11-22 NOTE — Progress Notes (Signed)
CARE Discharge Progress Report  Patient Details  Name: Joshua Bray MRN: [id number] Date of Birth: November 29, 1960 Referring Provider:   April Manson [facility] Exercise from 10/12/2020 in [facility]  Referring Provider Charlaine Dalton MD        Number of Visits: 2  Reason for Discharge:  Early Exit:  Personal and Lack of attendance  Diagnosis:  Malignant neoplasm of base of tongue ([place])  ADL [facility]:   Initial Exercise Prescription:  Initial Exercise Prescription - 10/12/20 1400       Date of Initial Exercise RX and Referring Provider   Date 10/12/20    Referring Provider Charlaine Dalton MD      Recumbant Bike   Level 2    RPM 60    Minutes 15    METs 2.8      NuStep   Level 2    SPM 80    Minutes 15    METs 2.8      REL-XR   Level 1    Speed 50    Minutes 15    METs 2.8      Track   Laps 35    Minutes 15    METs 2.9      Prescription Details   Frequency (times per week) 2    Duration Progress to 30 minutes of continuous aerobic without signs/symptoms of physical distress      Intensity   THRR 40-80% of Max Heartrate 111-144    Ratings of Perceived Exertion 11-13    Perceived Dyspnea 0-4      Progression   Progression Continue to progress workloads to maintain intensity without signs/symptoms of physical distress.      Resistance Training   Training Prescription Yes    Weight 3 lb    Reps 10-15             Discharge Exercise Prescription (Final Exercise Prescription Changes):  Exercise Prescription Changes - 10/12/20 1400       Response to Exercise   Blood Pressure (Admit) 140/76    Blood Pressure (Exercise) 162/74    Blood Pressure (Exit) 144/78    Heart Rate (Admit) 78 bpm    Heart Rate (Exercise) 105 bpm    Heart Rate (Exit) 96 bpm    Oxygen Saturation (Admit) 96 %    Oxygen Saturation (Exercise) 94 %    Oxygen Saturation (Exit) 97 %    Rating of Perceived Exertion (Exercise)  10    Perceived Dyspnea (Exercise) 1    Symptoms R. leg pain 3/10    Comments walk test results             Functional Capacity:  6 Minute Walk     Row Name 10/12/20 1417         6 Minute Walk   Phase Initial     Distance 1220 feet     Walk Time 6 minutes     # of Rest Breaks 0     MPH 2.31     METS 2.83     RPE 10     Perceived Dyspnea  1     VO2 Peak 9.91     Symptoms Yes (comment)     Comments R. leg pain 3/10     Resting HR 78 bpm     Resting BP 140/76     Resting Oxygen Saturation  96 %     Exercise Oxygen Saturation  during 6 min  walk 94 %     Max Ex. HR 105 bpm     Max Ex. BP 162/74     2 Minute Post BP 144/78               Nutrition & Weight - Outcomes:  Pre Biometrics - 10/12/20 1427       Pre Biometrics   Height 5' 10.5" (1.791 m)    Weight 290 lb 8 oz (131.8 kg)    BMI (Calculated) 41.08    Single Leg Stand 17.17 seconds              Goals reviewed with patient; copy given to patient.

## 2020-11-22 NOTE — Procedures (Signed)
Interventional Radiology Procedure Note  Procedure: [place] CORE BXS LEFT NECK NODE    Complications: None  Estimated Blood Loss:  MIN  Findings: 18 G CORES ON SALINE TELFA    Tamera Punt, MD

## 2020-11-24 LAB — SURGICAL PATHOLOGY

## 2020-11-24 NOTE — Telephone Encounter (Signed)
DIAGNOSIS:  A. LYMPH NODE, LEFT NECK; ULTRASOUND-GUIDED NEEDLE CORE BIOPSY:  - DENSE FIBROTIC TISSUE WITH RARE, SCATTERED CYTOKERATIN-POSITIVE CELLS.  - SEE COMMENT.   Comment:  Core biopsies demonstrate predominantly dense fibrotic stroma.  However,  there are scattered atypical cells which are positive for cytokeratin.  CD34 highlights a few blood vessels.  Together with the clinical  findings, these features are compatible with partial treatment response,  with residual scant foci of metastatic squamous cell carcinoma.  The  case was shown for intradepartmental consensus.

## 2020-11-25 ENCOUNTER — Telehealth: Payer: Self-pay | Admitting: Internal Medicine

## 2020-11-25 ENCOUNTER — Telehealth: Payer: Self-pay | Admitting: *Deleted

## 2020-11-25 NOTE — Telephone Encounter (Signed)
I left a vm for Dr. Rutherford Nail (Phone: [phone]) to call Dr. Aletha Halim cell #. Also faxed path report to Fax: [phone]

## 2020-11-25 NOTE — Telephone Encounter (Signed)
I spoke to Dr.Overton- re: pt's residual cancer noted on the needle Biopsy; pt will surgery.  Pt will need to come off anti-coagulation [hx of PE] prior to surgery.

## 2020-11-26 ENCOUNTER — Telehealth: Payer: Self-pay | Admitting: Internal Medicine

## 2020-11-26 DIAGNOSIS — C01 Malignant neoplasm of base of tongue: Secondary | ICD-10-CM

## 2020-11-26 NOTE — Telephone Encounter (Signed)
On 7/22-spoke to the patient regarding the results of the biopsy-residual malignancy noted.  Encouraged the  to proceed with surgery as discussed with Dr. Rutherford Nail.  As per the patient surgery tentatively planned for end of August.  Recommend follow-up in first week of August  C-schedule follow-up first week of August; MD labs CBC CMP; port flush.  Cancel September appointment  Cc; Drs.Chrystal/Bennett,   GB

## 2020-11-29 NOTE — Telephone Encounter (Signed)
Lab orders entered

## 2020-11-29 NOTE — Addendum Note (Signed)
Addended by: Gloris Ham on: 11/29/2020 10:56 AM   Modules accepted: Orders

## 2020-12-01 ENCOUNTER — Encounter: Payer: Self-pay | Admitting: Internal Medicine

## 2020-12-03 ENCOUNTER — Inpatient Hospital Stay: Payer: 59

## 2020-12-03 ENCOUNTER — Encounter: Payer: Self-pay | Admitting: Radiation Oncology

## 2020-12-03 ENCOUNTER — Other Ambulatory Visit: Payer: Self-pay

## 2020-12-03 ENCOUNTER — Ambulatory Visit
Admission: RE | Admit: 2020-12-03 | Discharge: 2020-12-03 | Disposition: A | Discharge status: Home / Self Care | Payer: 59 | Source: Ambulatory Visit | Attending: Radiation Oncology | Admitting: Radiation Oncology

## 2020-12-03 VITALS — BP 146/86 | HR 82 | Temp 97.5°F | Wt 292.7 lb

## 2020-12-03 DIAGNOSIS — C01 Malignant neoplasm of base of tongue: Secondary | ICD-10-CM | POA: Insufficient documentation

## 2020-12-03 DIAGNOSIS — Z7901 Long term (current) use of anticoagulants: Secondary | ICD-10-CM | POA: Diagnosis not present

## 2020-12-03 DIAGNOSIS — Z9221 Personal history of antineoplastic chemotherapy: Secondary | ICD-10-CM | POA: Diagnosis not present

## 2020-12-03 DIAGNOSIS — Z923 Personal history of irradiation: Secondary | ICD-10-CM | POA: Insufficient documentation

## 2020-12-03 DIAGNOSIS — Z86711 Personal history of pulmonary embolism: Secondary | ICD-10-CM | POA: Diagnosis not present

## 2020-12-03 NOTE — Progress Notes (Signed)
Radiation Oncology Follow up Note  Name: Joshua Bray   Date:   12/03/2020 MRN:  [id number] DOB: 21-Aug-1960    This 60 y.o. 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  REFERRING PROVIDER: MVerl Bangs FNP  HPI: Patient is a 60year old male now at 5 months having completed concurrent chemoradiation therapy for massive cervical lymph node involvement of squamous cell carcinoma from carcinoma the base of tongue stage IVb squamous cell carcinoma.  Seen today in routine follow-up he is doing fairly well he states his taste is returned about 75%.  He is having no significant head and neck pain or dysphagia.  He recently had a follow-up PET CT scan.  PET scan showed further reduction activity of lymph nodes in the neck.  The dominant left cervical lymph nodes also mildly reduced in size.  No recurrence of the base of tongue is noted.  He did have a lymph node biopsy of the left neck recently showing dense fibrotic tissue with rare scattered cytokeratin positive cells concerning for residual disease.  He is scheduled for bilateral neck dissections in early August.  Patient in April did develop acute bilateral pulmonary emboli for which she is now on Eliquis.  COMPLICATIONS OF TREATMENT: none  FOLLOW UP COMPLIANCE: keeps appointments   PHYSICAL EXAM:  BP (!) 146/86   Pulse 82   Temp (!) 97.5 F (36.4 C) (Tympanic)   Wt 292 lb 11.2 oz (132.8 kg)   BMI 41.40 kg/m  Patient does have still perceived bulky adenopathy mostly in the left neck although still some abnormal nodes in the right neck.  Well-developed well-nourished patient in NAD. HEENT reveals PERLA, EOMI, discs not visualized.  Oral cavity is clear. No oral mucosal lesions are identified. Neck is clear without evidence of cervical  or supraclavicular adenopathy. Lungs are clear to A&P. Cardiac examination is essentially unremarkable with regular rate and rhythm without murmur rub or thrill. Abdomen is benign with no organomegaly or masses noted. Motor sensory and DTR levels are equal and symmetric in the upper and lower extremities. Cranial nerves II through XII are grossly intact. Proprioception is intact. No peripheral adenopathy or edema is identified. No motor or sensory levels are noted. Crude visual fields are within normal range.  RADIOLOGY RESULTS: PET scan reviewed compatible with above-stated findings pathology report also reviewed  PLAN: Present time patient undergo bilateral neck dissections.  Seems has had excellent response to treatment.  With only scattered cytokeratin positive cells.  I have asked to see him back in 2 months for follow-up and will be able to reevaluate his pathology at that time.  Patient continues close follow-up care with medical oncology.  I would like to take this opportunity to thank you for allowing me to participate in the care of your patient..Noreene Filbert MD

## 2020-12-06 ENCOUNTER — Inpatient Hospital Stay (HOSPITAL_BASED_OUTPATIENT_CLINIC_OR_DEPARTMENT_OTHER): Payer: 59 | Admitting: Internal Medicine

## 2020-12-06 ENCOUNTER — Encounter: Payer: Self-pay | Admitting: Internal Medicine

## 2020-12-06 ENCOUNTER — Other Ambulatory Visit: Payer: Self-pay

## 2020-12-06 ENCOUNTER — Inpatient Hospital Stay: Payer: 59 | Attending: Internal Medicine

## 2020-12-06 DIAGNOSIS — R350 Frequency of micturition: Secondary | ICD-10-CM | POA: Diagnosis not present

## 2020-12-06 DIAGNOSIS — Z87891 Personal history of nicotine dependence: Secondary | ICD-10-CM | POA: Diagnosis not present

## 2020-12-06 DIAGNOSIS — Z86711 Personal history of pulmonary embolism: Secondary | ICD-10-CM | POA: Diagnosis not present

## 2020-12-06 DIAGNOSIS — C01 Malignant neoplasm of base of tongue: Secondary | ICD-10-CM | POA: Insufficient documentation

## 2020-12-06 DIAGNOSIS — Z95828 Presence of other vascular implants and grafts: Secondary | ICD-10-CM

## 2020-12-06 LAB — COMPREHENSIVE METABOLIC PANEL
ALT: 13 U/L (ref 0–44)
AST: 17 U/L (ref 15–41)
Albumin: 4 g/dL (ref 3.5–5.0)
Alkaline Phosphatase: 69 U/L (ref 38–126)
Anion gap: 9 (ref 5–15)
BUN: 17 mg/dL (ref 6–20)
CO2: 26 mmol/L (ref 22–32)
Calcium: 9.3 mg/dL (ref 8.9–10.3)
Chloride: 104 mmol/L (ref 98–111)
Creatinine, Ser: 1.2 mg/dL (ref 0.61–1.24)
GFR, Estimated: >60 mL/min (ref >60)
Glucose, Bld: 128 mg/dL — ABNORMAL HIGH (ref 70–99)
Potassium: 3.6 mmol/L (ref 3.5–5.1)
Sodium: 139 mmol/L (ref 135–145)
Total Bilirubin: 0.7 mg/dL (ref 0.3–1.2)
Total Protein: 6.6 g/dL (ref 6.5–8.1)

## 2020-12-06 LAB — CBC WITH DIFFERENTIAL/PLATELET
Abs Immature Granulocytes: 0.03 10*3/uL (ref 0.00–0.07)
Basophils Absolute: 0.1 10*3/uL (ref 0.0–0.1)
Basophils Relative: 1 %
Eosinophils Absolute: 0.4 10*3/uL (ref 0.0–0.5)
Eosinophils Relative: 8 %
HCT: 40 % (ref 39.0–52.0)
Hemoglobin: 14.4 g/dL (ref 13.0–17.0)
Immature Granulocytes: 1 %
Lymphocytes Relative: 28 %
Lymphs Abs: 1.4 10*3/uL (ref 0.7–4.0)
MCH: 33.1 pg (ref 26.0–34.0)
MCHC: 36 g/dL (ref 30.0–36.0)
MCV: 92 fL (ref 80.0–100.0)
Monocytes Absolute: 0.7 10*3/uL (ref 0.1–1.0)
Monocytes Relative: 14 %
Neutro Abs: 2.3 10*3/uL (ref 1.7–7.7)
Neutrophils Relative %: 48 %
Platelets: 159 10*3/uL (ref 150–400)
RBC: 4.35 MIL/uL (ref 4.22–5.81)
RDW: 12.8 % (ref 11.5–15.5)
WBC: 4.9 10*3/uL (ref 4.0–10.5)
nRBC: 0 % (ref 0.0–0.2)

## 2020-12-06 MED ORDER — HEPARIN SOD (PORK) LOCK FLUSH 100 UNIT/ML IV SOLN
500.0000 [IU] | Freq: Once | INTRAVENOUS | Status: AC
  Administered 2020-12-06: 500 [IU] via INTRAVENOUS
  Filled 2020-12-06: qty 5

## 2020-12-06 MED ORDER — SODIUM CHLORIDE 0.9% FLUSH
10.0000 mL | Freq: Once | INTRAVENOUS | Status: AC
Start: 2020-12-06 — End: 2020-12-06
  Administered 2020-12-06: 10 mL via INTRAVENOUS
  Filled 2020-12-06: qty 10
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
## 2020-12-06 NOTE — Patient Instructions (Signed)
#  Last dose of Eliquis prior to surgery on aug 8th.   #Restart Eliquis-after surgery/at discharge-at the discretion of Dr. Rutherford Nail.

## 2020-12-06 NOTE — Assessment & Plan Note (Addendum)
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  July 2022-PET scan mild further reduction in activity in the lymph nodes in the neck. The dominant left cervical lymph node is also mildly reduced in size compared to prior. No recurrence of the tongue base mass.  FNA of the active lesion on PET scan-positive for residual malignancy.  #Patient reevaluated by surgery at [facility] for surgery on December 17, 2020. -   #Frequent urination/urinary incontinence-question BPH-await resolution of acute issues.  # Incidental uptake noted in the anal region-reviewed the PET scan; no recent colonoscopy.  Await resolution of acute issues.  # Bil PN UE/LE-- G-2-3; recommend gabapentin 300 mg nightly for 1 week; recommend acupuncture  # Acute PE bilateral [April 10th, 2022]-[provoked sec to chemo].  [3-5 days in pt post surgery.Marland Kitchen ]currently on Eliquis- HOLD eliquis- 3 days prior- and start post operative.   # Mediport/IV Access: STABLE; port flush on 8/01.    # DISPOSITION:  # follow up last week of aug- MD; labs- cbc/cmp/mag;- Dr.B ---------------------------------------------------------------------------------------------------- #Last dose of Eliquis prior to surgery on aug 8th.   #Restart Eliquis-after surgery/at discharge-at the discretion of Dr. Rutherford Nail.  Cc; Dr.Overton.

## 2020-12-07 ENCOUNTER — Telehealth: Payer: Self-pay | Admitting: *Deleted

## 2020-12-07 NOTE — Telephone Encounter (Signed)
RN to fax clearance note to Dr. Rutherford Nail

## 2020-12-07 NOTE — Telephone Encounter (Signed)
#  Last dose of Eliquis prior to surgery on aug 8th. #Restart Eliquis-aftersurgery/at discharge-at the discretion of Dr. Rutherford Nail

## 2020-12-12 ENCOUNTER — Encounter: Payer: Self-pay | Admitting: Internal Medicine

## 2020-12-28 ENCOUNTER — Other Ambulatory Visit: Payer: Self-pay | Admitting: *Deleted

## 2020-12-28 DIAGNOSIS — C01 Malignant neoplasm of base of tongue: Secondary | ICD-10-CM

## 2020-12-30 MED FILL — DEXAMETHASONE 2 MG TABLET: 30 days supply | Qty: 30 | Fill #1

## 2021-01-03 ENCOUNTER — Inpatient Hospital Stay (HOSPITAL_BASED_OUTPATIENT_CLINIC_OR_DEPARTMENT_OTHER): Payer: 59 | Admitting: Internal Medicine

## 2021-01-03 ENCOUNTER — Inpatient Hospital Stay: Payer: 59

## 2021-01-03 ENCOUNTER — Other Ambulatory Visit: Payer: Self-pay

## 2021-01-03 VITALS — BP 131/71 | HR 70 | Temp 98.2°F | Resp 20 | Ht 70.5 in | Wt 289.0 lb

## 2021-01-03 DIAGNOSIS — Z95828 Presence of other vascular implants and grafts: Secondary | ICD-10-CM | POA: Diagnosis not present

## 2021-01-03 DIAGNOSIS — C01 Malignant neoplasm of base of tongue: Secondary | ICD-10-CM | POA: Diagnosis not present

## 2021-01-03 DIAGNOSIS — Z1211 Encounter for screening for malignant neoplasm of colon: Secondary | ICD-10-CM

## 2021-01-03 LAB — COMPREHENSIVE METABOLIC PANEL
ALT: 11 U/L (ref 0–44)
AST: 14 U/L — ABNORMAL LOW (ref 15–41)
Albumin: 3.4 g/dL — ABNORMAL LOW (ref 3.5–5.0)
Alkaline Phosphatase: 73 U/L (ref 38–126)
Anion gap: 10 (ref 5–15)
BUN: 24 mg/dL — ABNORMAL HIGH (ref 6–20)
CO2: 25 mmol/L (ref 22–32)
Calcium: 9 mg/dL (ref 8.9–10.3)
Chloride: 104 mmol/L (ref 98–111)
Creatinine, Ser: 1.13 mg/dL (ref 0.61–1.24)
GFR, Estimated: >60 mL/min (ref >60)
Glucose, Bld: 117 mg/dL — ABNORMAL HIGH (ref 70–99)
Potassium: 3.6 mmol/L (ref 3.5–5.1)
Sodium: 139 mmol/L (ref 135–145)
Total Bilirubin: 0.5 mg/dL (ref 0.3–1.2)
Total Protein: 6.4 g/dL — ABNORMAL LOW (ref 6.5–8.1)

## 2021-01-03 LAB — CBC WITH DIFFERENTIAL/PLATELET
Abs Immature Granulocytes: 0.04 10*3/uL (ref 0.00–0.07)
Basophils Absolute: 0 10*3/uL (ref 0.0–0.1)
Basophils Relative: 1 %
Eosinophils Absolute: 0.2 10*3/uL (ref 0.0–0.5)
Eosinophils Relative: 3 %
HCT: 34.7 % — ABNORMAL LOW (ref 39.0–52.0)
Hemoglobin: 12.3 g/dL — ABNORMAL LOW (ref 13.0–17.0)
Immature Granulocytes: 1 %
Lymphocytes Relative: 27 %
Lymphs Abs: 1.7 10*3/uL (ref 0.7–4.0)
MCH: 33.1 pg (ref 26.0–34.0)
MCHC: 35.4 g/dL (ref 30.0–36.0)
MCV: 93.3 fL (ref 80.0–100.0)
Monocytes Absolute: 0.6 10*3/uL (ref 0.1–1.0)
Monocytes Relative: 10 %
Neutro Abs: 3.7 10*3/uL (ref 1.7–7.7)
Neutrophils Relative %: 58 %
Platelets: 268 10*3/uL (ref 150–400)
RBC: 3.72 MIL/uL — ABNORMAL LOW (ref 4.22–5.81)
RDW: 13 % (ref 11.5–15.5)
WBC: 6.3 10*3/uL (ref 4.0–10.5)
nRBC: 0 % (ref 0.0–0.2)

## 2021-01-03 LAB — MAGNESIUM: Magnesium: 1.9 mg/dL (ref 1.7–2.4)

## 2021-01-03 NOTE — Progress Notes (Signed)
Port removal order faxed to specialty scheduling [phone]
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
## 2021-01-03 NOTE — Assessment & Plan Note (Addendum)
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 Dec 17, 2020-bilateral salvage neck dissections-no evidence of viable cancer noted.  Reviewed the pathology with patient detail.  Recommend evaluation with Maureen/for the consideration of management of neck lymphedema.  # Incidental uptake noted in the anal region-reviewed the PET scan; no recent colonoscopy.  Refer to GI.  # Bil PN UE/LE-- G-2-3; recommend gabapentin 300 mg nightly for 1 week; recommend acupuncture.  # Acute PE bilateral [April 10th, 2022]-[provoked sec to chemo].  [3-5 days in pt post surgery. ]currently on Eliquis- Eliquis until October min x 6 month  # Mediport/IV Access: STABLE; port flush on 8/01-recommend port explantation.  # DISPOSITION:  # port explanation # referral to [facility] re: coloscnopoy/anal update on PET scan [ordered] # Maureen- lymphedema of neck/ssp Surgery # follow up in 6 months-  MD; labs- cbc/cmp/mag;- Dr.B  Cc; Dr.Overton.

## 2021-01-04 ENCOUNTER — Telehealth: Payer: Self-pay | Admitting: *Deleted

## 2021-01-04 ENCOUNTER — Other Ambulatory Visit (INDEPENDENT_AMBULATORY_CARE_PROVIDER_SITE_OTHER): Payer: Self-pay

## 2021-01-04 ENCOUNTER — Other Ambulatory Visit: Payer: Self-pay

## 2021-01-04 ENCOUNTER — Other Ambulatory Visit: Payer: Self-pay | Admitting: Radiology

## 2021-01-04 DIAGNOSIS — Z1211 Encounter for screening for malignant neoplasm of colon: Secondary | ICD-10-CM

## 2021-01-04 MED ORDER — PEG 3350-KCL-NA BICARB-NACL 420 G PO SOLR: 4000.0000 mL | Freq: Once | ORAL | 0 refills | Status: AC

## 2021-01-04 NOTE — Progress Notes (Signed)
Gastroenterology Pre-Procedure Review  Request Date: 02/08/2021 Requesting Physician: Dr. Vicente Males   PATIENT REVIEW QUESTIONS: The patient responded to the following health history questions as indicated:    1. Are you having any GI issues? no 2. Do you have a personal history of Polyps? no 3. Do you have a family history of Colon Cancer or Polyps? yes (both sides) 4. Diabetes Mellitus? yes (type 2) 5. Joint replacements in the past 12 months?no 6. Major health problems in the past 3 months?yes (cancer) 7. Any artificial heart valves, MVP, or defibrillator?no    MEDICATIONS & ALLERGIES:    Patient reports the following regarding taking any anticoagulation/antiplatelet therapy:   Plavix, Coumadin, Eliquis, Xarelto, Lovenox, Pradaxa, Brilinta, or Effient? yes (eliquis) Aspirin? no  Patient confirms/reports the following medications:  Current Outpatient Medications  Medication Sig Dispense Refill   acetaminophen (TYLENOL) 500 MG tablet Take 1,000 mg by mouth 2 (two) times daily.     amLODipine (NORVASC) 10 MG tablet Take 1 tablet (10 mg total) by mouth daily. 90 tablet 0   apixaban (ELIQUIS) 5 MG TABS tablet Take 1 tablet (5 mg total) by mouth 2 (two) times daily. 60 tablet 4   metFORMIN (GLUCOPHAGE) 500 MG tablet Take 1 tablet (500 mg total) by mouth 2 (two) times daily with a meal. 180 tablet 1   No current facility-administered medications for this visit.   Facility-Administered Medications Ordered in Other Visits  Medication Dose Route Frequency Provider Last Rate Last Admin   sodium chloride flush (NS) 0.9 % injection 10 mL  10 mL Intravenous PRN Lloyd Huger, MD   10 mL at 05/21/20 1055    Patient confirms/reports the following allergies:  Allergies  Allergen Reactions   Penicillins     Unknown, allergy from an infant    No orders of the defined types were placed in this encounter.   AUTHORIZATION INFORMATION Primary Insurance: 1D#: Group #:  Secondary  Insurance: 1D#: Group #:  SCHEDULE INFORMATION: Date: 02/08/2021 Time: Location: [facility]

## 2021-01-04 NOTE — Progress Notes (Deleted)
Had patient moved to dr Marius Ditch its her day in [place]

## 2021-01-04 NOTE — Telephone Encounter (Signed)
Contacted patient with apt for port a cath removal (9/2 at 9 am arrival time). Pt will have sedation. No need to hold blood thinners at this time per specialty scheduling. Pt will need to be NPO 6-8 hours prior to the apt.

## 2021-01-04 NOTE — Progress Notes (Signed)
Patient on schedule for Port removal 01/07/2021, called and spoke with patient on phone with pre procedure instructions given. Made aware to be here @ 0900, NPO after MN prior to procedure/driver post procedure/recovery/discharge. Stated understanding.

## 2021-01-06 ENCOUNTER — Other Ambulatory Visit: Payer: Self-pay | Admitting: Radiology

## 2021-01-07 ENCOUNTER — Other Ambulatory Visit: Payer: Self-pay

## 2021-01-07 ENCOUNTER — Ambulatory Visit
Admission: RE | Admit: 2021-01-07 | Discharge: 2021-01-07 | Disposition: A | Discharge status: Home / Self Care | Payer: 59 | Source: Ambulatory Visit | Attending: Internal Medicine | Admitting: Internal Medicine

## 2021-01-07 ENCOUNTER — Encounter: Payer: Self-pay | Admitting: Radiology

## 2021-01-07 DIAGNOSIS — Z95828 Presence of other vascular implants and grafts: Secondary | ICD-10-CM

## 2021-01-07 DIAGNOSIS — Z7901 Long term (current) use of anticoagulants: Secondary | ICD-10-CM | POA: Diagnosis not present

## 2021-01-07 DIAGNOSIS — Z7984 Long term (current) use of oral hypoglycemic drugs: Secondary | ICD-10-CM | POA: Insufficient documentation

## 2021-01-07 DIAGNOSIS — Z880 Allergy status to penicillin: Secondary | ICD-10-CM | POA: Diagnosis not present

## 2021-01-07 DIAGNOSIS — Z79899 Other long term (current) drug therapy: Secondary | ICD-10-CM | POA: Insufficient documentation

## 2021-01-07 DIAGNOSIS — Z452 Encounter for adjustment and management of vascular access device: Secondary | ICD-10-CM | POA: Diagnosis not present

## 2021-01-07 HISTORY — PX: IR REMOVAL TUN ACCESS W/ PORT W/O FL MOD SED: IMG2290

## 2021-01-07 LAB — GLUCOSE, CAPILLARY
Glucose-Capillary: 121 mg/dL — ABNORMAL HIGH (ref 70–99)
Glucose-Capillary: 119 mg/dL — ABNORMAL HIGH (ref 70–99)

## 2021-01-07 MED ORDER — FENTANYL CITRATE (PF) 100 MCG/2ML IJ SOLN
INTRAMUSCULAR | Status: AC
  Filled 2021-01-07: qty 2

## 2021-01-07 MED ORDER — MIDAZOLAM HCL 2 MG/2ML IJ SOLN
INTRAMUSCULAR | Status: AC | PRN
  Administered 2021-01-07: 1 mg via INTRAVENOUS

## 2021-01-07 MED ORDER — SODIUM CHLORIDE 0.9 % IV SOLN
INTRAVENOUS | Status: DC
  Filled 2021-01-07: qty 1000

## 2021-01-07 MED ORDER — FENTANYL CITRATE (PF) 100 MCG/2ML IJ SOLN
INTRAMUSCULAR | Status: AC | PRN
  Administered 2021-01-07: 25 ug via INTRAVENOUS

## 2021-01-07 MED ORDER — LIDOCAINE-EPINEPHRINE 1 %-1:100000 IJ SOLN
INTRAMUSCULAR | Status: AC
  Administered 2021-01-07: 10 mL
  Filled 2021-01-07: qty 1

## 2021-01-07 MED ORDER — MIDAZOLAM HCL 2 MG/2ML IJ SOLN
INTRAMUSCULAR | Status: AC
  Filled 2021-01-07: qty 2

## 2021-01-07 NOTE — Discharge Instructions (Signed)
Implanted Port Removal, Care After Refer to this sheet in the next few weeks. These instructions provide you with information about caring for yourself after your procedure. Your health care provider may also give you more specific instructions. Your treatment has been planned according to current medical practices, but problems sometimes occur. Call your health care provider if you have any problems or questions after your procedure. What can I expect after the procedure? After the procedure, it is common to have: Soreness or pain near your incision. Some swelling or bruising near your incision.  Follow these instructions at home: Medicines Take over-the-counter and prescription medicines only as told by your health care provider. If you were prescribed an antibiotic medicine, take it as told by your health care provider. Do not stop taking the antibiotic even if you start to feel better. Bathing You may shower tomorrow.  Incision care You have an incision with sutures that are dissolvable and skin glue over top incision.  This skin glue will wear off over time.  Do not peel or try to remove this yourself. Keep your dressing dry. Check your incision area every day for signs of infection and if present call your doctor and let them know. Check for: More redness, swelling, or pain. More fluid or blood. Warmth. Pus or a bad smell. Driving If you received a sedative, do not drive for 24 hours after the procedure. If you did not receive a sedative, ask your health care provider when it is safe to drive. Activity Return to your normal activities as told by your health care provider. Ask your health care provider what activities are safe for you. Until your health care provider says it is safe: Do not lift anything that is heavier than 10 lb (4.5 kg). Do not do activities that involve lifting your arms over your head. General instructions Do not use any tobacco products, such as cigarettes,  chewing tobacco, and e-cigarettes. Tobacco can delay healing. If you need help quitting, ask your health care provider. Keep all follow-up visits as told by your health care provider. This is important. Contact a health care provider if: You have more redness, swelling, or pain around your incision. You have more fluid or blood coming from your incision. Your incision feels warm to the touch. You have pus or a bad smell coming from your incision. You have a fever. You have pain that is not relieved by your pain medicine. Get help right away if: You have chest pain. You have difficulty breathing. This information is not intended to replace advice given to you by your health care provider. Make sure you discuss any questions you have with your health care provider. Document Released: 04/05/2015 Document Revised: 09/30/2015 Document Reviewed: 01/27/2015 Elsevier Interactive Patient Education  2018 Elsevier Inc.  

## 2021-01-07 NOTE — Procedures (Signed)
Vascular and Interventional Radiology Procedure Note  Patient: Joshua Bray DOB: 1960/08/22 Medical Record Number: [id number] Note Date/Time: 01/07/21 11:49 AM   Performing Physician: Michaelle Birks, MD Assistant(s): None  Diagnosis: Head and Neck cancer. Rx completion.  Procedure: PORT REMOVAL  Anesthesia: Conscious Sedation Complications: None Estimated Blood Loss: Minimal Specimens:  None  Findings:  Successful removal of a right-sided venous port. Primary incision closure. Dermabond at skin.  See detailed procedure note with images in PACS. The patient tolerated the procedure well without incident or complication and was returned to Recovery in stable condition.    Michaelle Birks, MD Vascular and Interventional Radiology Specialists [facility] Radiology   Pager. [place]

## 2021-01-07 NOTE — H&P (Signed)
Vascular and Interventional Radiology  PRE PROCEDURE H&P  Assessment  Plan:   Mr. Joshua Bray is a 60 y.o. year old male who will undergo PORT REMOVAL in Interventional Radiology.  The procedure has been fully reviewed with the patient/patient's authorized representative. The risks, benefits and alternatives have been explained, and the patient/patient's authorized representative has consented to the procedure. -- The patient will accept blood products in an emergent situation. -- The patient does not have a Do Not Resuscitate order in effect.  HPI: Mr. Joshua Bray is a 60 y.o. year old male with a PMHx significant for H&N CA, now s/p completion of therapy. Request for port removal. Informed consent was obtained, witnessed and placed in the patient's chart.  Allergies:  Allergies  Allergen Reactions   Penicillins     Unknown, allergy from an infant    Medications:  Current Outpatient Medications on File Prior to Encounter  Medication Sig Dispense Refill   acetaminophen (TYLENOL) 500 MG tablet Take 1,000 mg by mouth 2 (two) times daily.     amLODipine (NORVASC) 10 MG tablet Take 1 tablet (10 mg total) by mouth daily. 90 tablet 0   metFORMIN (GLUCOPHAGE) 500 MG tablet Take 1 tablet (500 mg total) by mouth 2 (two) times daily with a meal. 180 tablet 1   apixaban (ELIQUIS) 5 MG TABS tablet Take 1 tablet (5 mg total) by mouth 2 (two) times daily. 60 tablet 4   Current Facility-Administered Medications on File Prior to Encounter  Medication Dose Route Frequency Provider Last Rate Last Admin   sodium chloride flush (NS) 0.9 % injection 10 mL  10 mL Intravenous PRN Lloyd Huger, MD   10 mL at 05/21/20 1055    PSH:  Past Surgical History:  Procedure Laterality Date   FEMUR SURGERY Left 1983   IR IMAGING GUIDED PORT INSERTION  04/16/2020    PMH:  Past Medical History:  Diagnosis Date   Diabetes mellitus without complication ([place])    Hypertension    Mucositis      Brief Physical Examination: Vitals:   01/07/21 0938  BP: 137/83  Pulse: 73  Resp: 18  Temp: 98.1 F (36.7 C)  SpO2: 96%   General: WD, WN male in NAD HEENT: Normocephalic, atraumatic Lungs: Respirations non-labored  ASA Grade: 3 Mallampati Class: 3   Michaelle Birks, MD Vascular and Interventional Radiology Specialists [facility] Radiology   Pager. [place]

## 2021-01-12 ENCOUNTER — Other Ambulatory Visit: Payer: Self-pay

## 2021-01-12 ENCOUNTER — Inpatient Hospital Stay: Payer: 59 | Attending: Oncology | Admitting: Occupational Therapy

## 2021-01-12 ENCOUNTER — Other Ambulatory Visit: Payer: Self-pay | Admitting: *Deleted

## 2021-01-12 DIAGNOSIS — M436 Torticollis: Secondary | ICD-10-CM

## 2021-01-12 DIAGNOSIS — I890 Lymphedema, not elsewhere classified: Secondary | ICD-10-CM

## 2021-01-12 DIAGNOSIS — L905 Scar conditions and fibrosis of skin: Secondary | ICD-10-CM

## 2021-01-12 DIAGNOSIS — C01 Malignant neoplasm of base of tongue: Secondary | ICD-10-CM

## 2021-01-12 NOTE — Therapy (Signed)
[facility] [address] [place], [place], [place], [address] Phone: [phone]   Fax:  [phone]  Occupational Therapy Screen:  Patient Details  Name: Joshua Bray MRN: [id number] Date of Birth: 1960-06-22 No data recorded  Encounter Date: 01/12/2021   OT End of Session - 01/12/21 2253     Visit Number 0             Past Medical History:  Diagnosis Date   Diabetes mellitus without complication ([place])    Hypertension    Mucositis     Past Surgical History:  Procedure Laterality Date   FEMUR SURGERY Left 1983   IR IMAGING GUIDED PORT INSERTION  04/16/2020   IR REMOVAL TUN ACCESS W/ PORT W/O FL MOD SED  01/07/2021    There were no vitals filed for this visit.   Subjective Assessment - 01/12/21 2252     Subjective  Can you help me - Dr Rogue Bussing refer me to you - are you lymphedema therapist- my swelling increased since my surgery at my neck about 4 wks ago - and I knew it would happend    Currently in Pain? 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  July 2022-PET scan mild further reduction in activity in the lymph nodes in the neck. The dominant left cervical lymph node is also mildly reduced in size compared to prior. No recurrence of the tongue base mass.  FNA of the active lesion on PET scan-positive for residual malignancy.   #Patient reevaluated by surgery at [facility] for surgery on December 17, 2020. - 12/29/2020: Comes back in today after undergoing bilateral salvage neck dissections. Doing fairly well. Starting to notice some swelling under his chin above his incision line as we expected. He notes  some tethering more on the right side than the left. Overall, no issues. Pathology has come back showing treated and did tumors. No evidence of active or viable cancer thankfully.     #Frequent urination/urinary incontinence-question BPH-await resolution of acute issues.   # Incidental uptake noted in the anal region-reviewed the PET scan; no recent colonoscopy.  Await resolution of acute issues.    # Bil PN UE/LE-- G-2-3; recommend gabapentin 300 mg nightly for 1 week; recommend acupuncture   # Acute PE bilateral [April 10th, 2022]-[provoked sec to chemo].  [3-5 days in pt post surgery. ]currently on Eliquis- Eliquis until October min x 6 month   # Mediport/IV Access: STABLE; port flush on 8/01.     # DISPOSITION:  # port explanation # referral to [facility] re: coloscnopoy/anal update on PET scan [ordered] # Josepha Barbier- lymphedema of neck/ssp Surgery # follow up in 6 months-  MD; labs- cbc/cmp/mag;- Dr.B    OT SCREEN 01/12/21: Pt is 3 1/2 wks s/p neck surgery - pt with horizontal scar on anterior neck-/Throat some stitches still in place -and scar adhere - pt with increase swelling under chin , above scar and lateral neck into cheeks Pt showed photo from prior  Pt cervical AROM limited in all planes and  some pain into R ear with cervical motion -scar tissue adhere Pt with rounded shoulder and upper traps tight and tender   Pt to do scapula squeezes several times during day Cervical AROM in all planes - and can do gentle scar mobs in comfortable end range cervical rotation  Pt ed and reviiew some modified MLD for axillary ln, anterior anestomosiis, cervical ln , along neck and ear - rework Then under chin , and then lateral neck , and rework several times  Recommend OT eval and tx for head and neck lymhedema ,and cervical AROM  and pt in agreement  Dr Rogue Bussing agree and order for outpt send                                     Patient will benefit from  skilled therapeutic intervention in order to improve the following deficits and impairments:           Visit Diagnosis: Lymphedema, not elsewhere classified  Scar condition and fibrosis of skin  Stiffness of cervical spine    Problem List Patient Active Problem List   Diagnosis Date Noted   Acute pulmonary embolism ([place]) 08/15/2020   Chronic venous insufficiency 08/06/2020   Nausea with vomiting 07/06/2020   Hypomagnesemia 07/06/2020   Hypokalemia 07/06/2020   Counseling regarding advance care planning and goals of care 04/11/2020   Malignant neoplasm of base of tongue () 04/09/2020   Skin infection 03/11/2020   Tooth infection 03/11/2020   Neck swelling 03/11/2020   Encounter to establish care with new doctor 03/10/2020   Screening, lipid 03/10/2020   Screening for malignant neoplasm of prostate 03/10/2020   Weight gain 03/10/2020   Type 2 diabetes mellitus with hyperglycemia, without long-term current use of insulin ([place]) 03/10/2020   Essential hypertension 03/10/2020    Rosalyn Gess, OTR/L, CLT 01/12/2021, 10:54 PM  [facility] [address] [place], [place], [address] Phone: [phone]   Fax:  [phone]  Name: Joshua Bray MRN: [id number] Date of Birth: [birth year]

## 2021-01-14 ENCOUNTER — Ambulatory Visit: Payer: 59 | Admitting: Internal Medicine

## 2021-01-14 ENCOUNTER — Other Ambulatory Visit: Payer: 59

## 2021-01-17 ENCOUNTER — Other Ambulatory Visit: Payer: Self-pay

## 2021-01-17 NOTE — Progress Notes (Unsigned)
Sent new blood thinner request for clearance before his procedure to his pcp through communications and faxed to [phone]

## 2021-01-18 ENCOUNTER — Ambulatory Visit: Payer: 59 | Attending: Internal Medicine | Admitting: Occupational Therapy

## 2021-01-18 ENCOUNTER — Other Ambulatory Visit: Payer: Self-pay

## 2021-01-18 DIAGNOSIS — L905 Scar conditions and fibrosis of skin: Secondary | ICD-10-CM | POA: Insufficient documentation

## 2021-01-18 DIAGNOSIS — M6281 Muscle weakness (generalized): Secondary | ICD-10-CM | POA: Diagnosis present

## 2021-01-18 DIAGNOSIS — M436 Torticollis: Secondary | ICD-10-CM | POA: Diagnosis present

## 2021-01-18 DIAGNOSIS — I890 Lymphedema, not elsewhere classified: Secondary | ICD-10-CM | POA: Insufficient documentation

## 2021-01-18 DIAGNOSIS — M25612 Stiffness of left shoulder, not elsewhere classified: Secondary | ICD-10-CM | POA: Diagnosis present

## 2021-01-18 DIAGNOSIS — M25611 Stiffness of right shoulder, not elsewhere classified: Secondary | ICD-10-CM | POA: Diagnosis present

## 2021-01-18 DIAGNOSIS — M542 Cervicalgia: Secondary | ICD-10-CM | POA: Insufficient documentation

## 2021-01-18 NOTE — Therapy (Signed)
[facility] [address], [place], [address] Phone: [phone]   Fax:  [phone]  Occupational Therapy Evaluation  Patient Details  Name: Joshua Bray MRN: [id number] Date of Birth: 09/27/1960 Referring Provider (OT): Dr Rogue Bussing   Encounter Date: 01/18/2021   OT End of Session - 01/18/21 2147     Visit Number 1    Number of Visits 10    Date for OT Re-Evaluation 03/15/21    OT Start Time 1030    OT Stop Time 1117    OT Time Calculation (min) 47 min    Activity Tolerance Patient tolerated treatment well    Behavior During Therapy [facility] for tasks assessed/performed             Past Medical History:  Diagnosis Date   Diabetes mellitus without complication ([place])    Hypertension    Mucositis     Past Surgical History:  Procedure Laterality Date   FEMUR SURGERY Left 1983   IR IMAGING GUIDED PORT INSERTION  04/16/2020   IR REMOVAL TUN ACCESS W/ PORT W/O FL MOD SED  01/07/2021    There were no vitals filed for this visit.   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  July 2022-PET scan mild further reduction in activity in the lymph nodes in the neck. The dominant left cervical lymph node is also mildly reduced in size compared to prior. No recurrence of the tongue base mass.  FNA of the active lesion on PET scan-positive for residual malignancy-Patient reevaluated by surgery at [facility] for surgery on December 17, 2020. - 12/29/2020: Comes back in today after undergoing bilateral salvage neck  dissections. Doing fairly well. Starting to notice some swelling under his chin above his incision line as we expected. He notes some tethering more on the right side than the left. Overall, no issues. Pathology has come back showing treated and did tumors. 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 Yes    Pain Score 4     Pain Location Neck    Pain Orientation Right;Left    Pain Descriptors / Indicators Aching;Tender;Tightness    Pain Type Surgical pain    Pain Onset More than a month ago    Pain Frequency Constant               [facility] OT Assessment - 01/18/21 0001       Assessment   Medical Diagnosis Head,neck lymphedema, stiffness, scar tissue ,shoulder stiffness    Referring Provider (OT) Dr Rogue Bussing    Onset Date/Surgical Date 12/17/20    Hand Dominance Right      Precautions   Precaution Comments lymphedema neck      Home  Environment   Lives With Spouse  Prior Function   Vocation Self employed    Leisure print bussness , golf, around the house      Edema   Edema ear to ear 28 cm , scar 19 cm      AROM   Right Shoulder Flexion 147 Degrees    Right Shoulder ABduction 160 Degrees    Right Shoulder External Rotation --   WNL   Left Shoulder Flexion 120 Degrees    Left Shoulder ABduction 100 Degrees   pain   Left Shoulder External Rotation --   decrease   Cervical Flexion 30    Cervical Extension 30    Cervical - Right Side Bend 20    Cervical - Left Side Bend 10    Cervical - Right Rotation 20    Cervical - Left Rotation 25                    Pt to do scapula squeezes several times during day RTB for scapula squeezes and shoulder extention 2 x 12 reps  Cervical AROM in all planes - and can do gentle scar mobs in comfortable end range  cervical rotation - otherwise wait until stitches out Tape kinesiotape above scar L and R - with achor on PAA -and 3 fingers above scar to anterior neck and cheek To wear - ed on precautions  Mini massager done on anterior neck - fibrosis and circumference from ear to ear decrease by 1 cm   Pt ed and reviiew some modified MLD for axillary ln, anterior anestomosiis, cervical ln , along neck and ear - rework Then under chin , and then lateral neck , and rework several times  [place] for shoulder ABD AAROM  On wall shoulder flexion AAROM  10 reps 2 x day  Ext rotation stretch in doorway - step thru for light stretch - do not hang fw - 10reps  Pain less than 2./10           OT Education - 01/18/21 2147     Education Details findings of eval and HEP    Person(s) Educated Patient    Methods Explanation;Demonstration;Tactile cues;Verbal cues;Handout    Comprehension Verbalized understanding;Verbal cues required;Returned demonstration                 OT Long Term Goals - 01/18/21 2154       OT LONG TERM GOAL #1   Title Pt show increase cervical AROM in all planes by 10 degrees to drive with more ease and less pain on posterior neck    Baseline 4-5/10 pain - strain on posterior neck - decrease in all planes - flexion ,ext 30, rot to R 20, L 25, lat flexion L 10, R 20 degrees -    Time 4    Period Weeks    Status New    Target Date 02/15/21      OT LONG TERM GOAL #2   Title Pt to be independent in HEP For MLD , taping , AROM to decrease circumference in neck lymphedema with at least 2 cm    Baseline ear to ear 28 cm - decrease after mini massager in session by 1 cm - no knowledge of MLD , taping or ROM    Time 4    Period Weeks    Status New    Target Date 02/15/21      OT LONG TERM GOAL #3   Title Assess or setup pt with correct compression garment and  pump to decrease lymphedema and circumference in neck to decrease risk for pain and swallowing issues    Baseline 28 cm  ear to ear - no knowledge of lymphedema management - report if increase swelling - harder time swallowing    Time 6    Period Weeks    Status New    Target Date 03/01/21      OT LONG TERM GOAL #4   Title Bilateral shoulder AROM increase to [facility] and strength to peform over head reaching without symptoms or pain    Baseline report pain and fatigue over head L more than R - shoulder flexion 120 L , abd 100 adn ext decrease    Time 8    Period Weeks    Status New    Target Date 03/15/21                   Plan - 01/18/21 2148     Clinical Impression Statement Patient present at OT eval with diagnosis of head/neck lymphedema - had surgery on December 17, 2020. bilateral salvage neck dissections. Doing fairly well. Starting to notice some swelling under his chin above his incision line as we expected. He notes some tethering more on the right side than the left. Decrease stiffness in cervical AROM in all planes and L shoulder more than R - stitches still in place - pt ed on modify self MLD - and taping on R and L above scar with achor on PAA and 3 fingers above scar on neck and cheek. Pt pain 4-5/10 pain - scar adhere - pt to see surgeon this Thursday - and will adress scar when appropriate , MLD and assess need for compressoin and pump to help decongest head and neck lymphedema - pt limited by cervical and shoulder AROM and strength as lymphedema - pain - all limting his function in ADL and IADL's - pt can benefit form skilled OT services    OT Occupational Profile and History Problem Focused Assessment - Including review of records relating to presenting problem    Occupational performance deficits (Please refer to evaluation for details): ADL's;IADL's;Rest and Sleep;Work;Play;Leisure    Body Structure / Function / Physical Skills ADL;Strength;Decreased knowledge of use of DME;Decreased knowledge of precautions;Skin integrity;Sensation;Flexibility;Scar mobility;ROM;IADL;Edema;UE functional  use;Pain    Rehab Potential Good    Clinical Decision Making Limited treatment options, no task modification necessary    Comorbidities Affecting Occupational Performance: None    Modification or Assistance to Complete Evaluation  No modification of tasks or assist necessary to complete eval    OT Frequency 1x / week   increase if needed 2 x wk for lymphedema   OT Duration 8 weeks    OT Treatment/Interventions Self-care/ADL training;Contrast Bath;Therapeutic exercise;Manual Therapy;Manual lymph drainage;DME and/or AE instruction;Therapeutic activities;Compression bandaging;Scar mobilization;Coping strategies training;Passive range of motion;Patient/family education    Consulted and Agree with Plan of Care Patient             Patient will benefit from skilled therapeutic intervention in order to improve the following deficits and impairments:   Body Structure / Function / Physical Skills: ADL, Strength, Decreased knowledge of use of DME, Decreased knowledge of precautions, Skin integrity, Sensation, Flexibility, Scar mobility, ROM, IADL, Edema, UE functional use, Pain       Visit Diagnosis: Lymphedema, not elsewhere classified - Plan: Ot plan of care cert/re-cert  Scar condition and fibrosis of skin - Plan: Ot plan of care cert/re-cert  Stiffness of cervical spine -  Plan: Ot plan of care cert/re-cert  Stiffness of left shoulder, not elsewhere classified - Plan: Ot plan of care cert/re-cert  Stiffness of right shoulder, not elsewhere classified - Plan: Ot plan of care cert/re-cert  Muscle weakness (generalized) - Plan: Ot plan of care cert/re-cert  Cervical pain (neck) - Plan: Ot plan of care cert/re-cert    Problem List Patient Active Problem List   Diagnosis Date Noted   Acute pulmonary embolism ([place]) 08/15/2020   Chronic venous insufficiency 08/06/2020   Nausea with vomiting 07/06/2020   Hypomagnesemia 07/06/2020   Hypokalemia 07/06/2020   Counseling regarding advance  care planning and goals of care 04/11/2020   Malignant neoplasm of base of tongue ([place]) 04/09/2020   Skin infection 03/11/2020   Tooth infection 03/11/2020   Neck swelling 03/11/2020   Encounter to establish care with new doctor 03/10/2020   Screening, lipid 03/10/2020   Screening for malignant neoplasm of prostate 03/10/2020   Weight gain 03/10/2020   Type 2 diabetes mellitus with hyperglycemia, without long-term current use of insulin ([place]) 03/10/2020   Essential hypertension 03/10/2020    Rosalyn Gess, OTR/L, CLT 01/18/2021, 10:02 PM  [facility] [address], [place], [address] Phone: [phone]   Fax:  [phone]  Name: Joshua Bray MRN: [id number] Date of Birth: Aug 12, 1960

## 2021-01-18 NOTE — Progress Notes (Signed)
CANCER CALLED TO LET us KNOW THEY ONLY USE THE HEPRIN FOR MR. Joshua Bray WHEN THEY FLUSH HIS LINES AFTER DIALYSIS BUT HE IS ON ELIQUIS AND A BLOOD THINNER REQUEST WAS SENT

## 2021-01-20 ENCOUNTER — Telehealth: Payer: Self-pay | Admitting: Internal Medicine

## 2021-01-20 NOTE — Telephone Encounter (Signed)
Spoke with patient 01/20/2021 at 1345. Discussed plan to d/c the Eliquis on 10/1.He will be finished with coag therapy at that time. He understands that our office will fax the clearance notes to GI dept. He thanked me for updating him.

## 2021-01-20 NOTE — Telephone Encounter (Signed)
Patient awaiting colonoscopy in October 4th-I think it is reasonable to stop Eliquis on October 1st; and then not to restart Eliquis-as patient had finished approximately 6 months of anticoagulation.  Clearance form signed.    Heather please inform patient of above recommendations. Thanks GB

## 2021-01-24 ENCOUNTER — Ambulatory Visit: Payer: 59 | Admitting: Occupational Therapy

## 2021-01-24 ENCOUNTER — Telehealth: Payer: Self-pay

## 2021-01-24 NOTE — Telephone Encounter (Signed)
Called Joshua Bray to let him know about his blood clearance he informed me he just had neck surgery and wanted to reschedule his appointment for colonoscopyp for sometime next year and he would call us when he is ready to schedule

## 2021-01-25 ENCOUNTER — Ambulatory Visit: Payer: 59 | Admitting: Occupational Therapy

## 2021-01-25 DIAGNOSIS — M436 Torticollis: Secondary | ICD-10-CM

## 2021-01-25 DIAGNOSIS — I890 Lymphedema, not elsewhere classified: Secondary | ICD-10-CM

## 2021-01-25 DIAGNOSIS — M6281 Muscle weakness (generalized): Secondary | ICD-10-CM

## 2021-01-25 DIAGNOSIS — M25612 Stiffness of left shoulder, not elsewhere classified: Secondary | ICD-10-CM

## 2021-01-25 DIAGNOSIS — M542 Cervicalgia: Secondary | ICD-10-CM

## 2021-01-25 DIAGNOSIS — L905 Scar conditions and fibrosis of skin: Secondary | ICD-10-CM

## 2021-01-25 DIAGNOSIS — M25611 Stiffness of right shoulder, not elsewhere classified: Secondary | ICD-10-CM

## 2021-01-25 NOTE — Therapy (Signed)
[facility] [address], [place], [address] Phone: [phone]   Fax:  [phone]  Occupational Therapy Treatment  Patient Details  Name: Joshua Bray MRN: [id number] Date of Birth: Aug 20, 1960 Referring Provider (OT): Dr Rogue Bussing   Encounter Date: 01/25/2021   OT End of Session - 01/25/21 1717     Visit Number 2    Number of Visits 10    Date for OT Re-Evaluation 03/15/21    OT Start Time 1531    OT Stop Time 1633    OT Time Calculation (min) 62 min    Activity Tolerance Patient tolerated treatment well    Behavior During Therapy [facility] for tasks assessed/performed             Past Medical History:  Diagnosis Date   Diabetes mellitus without complication ([place])    Hypertension    Mucositis     Past Surgical History:  Procedure Laterality Date   FEMUR SURGERY Left 1983   IR IMAGING GUIDED PORT INSERTION  04/16/2020   IR REMOVAL TUN ACCESS W/ PORT W/O FL MOD SED  01/07/2021    There were no vitals filed for this visit.   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  July 2022-PET scan mild further reduction in activity in the lymph nodes in the neck. The dominant left cervical lymph node is also mildly reduced in size compared to prior. No recurrence of the tongue base mass.  FNA of the active lesion on PET scan-positive for residual malignancy-Patient reevaluated by surgery at [facility] for surgery on December 17, 2020. - 12/29/2020: Comes back in today after undergoing bilateral salvage neck dissections. Doing fairly well.  Starting to notice some swelling under his chin above his incision line as we expected. He notes some tethering more on the right side than the left. Overall, no issues. Pathology has come back showing treated and did tumors. 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 No/denies                [facility] OT Assessment - 01/25/21 0001       AROM   Left Shoulder Flexion 150 Degrees    Left Shoulder ABduction 110 Degrees    Cervical Flexion 35    Cervical Extension 35    Cervical - Right Side Bend 25    Cervical - Left Side Bend 15    Cervical - Right Rotation 35    Cervical - Left Rotation 35            Pt show increase cervical and L shoulder AROM Report that his stitches was coming out and he took it out himself Did not feel good this past week- depressed and was on couch more - work about 2-3 hrs day - but  has orders he needs to do  Wants to built up more muscle -and did had consults with SLP and dietician in past - not interested  Not hungry  Likes to play golf     Pt to do scapula squeezes several times during day RTB for scapula squeezes and shoulder extention 2 x 12 reps Pull downs with RTB this date add  2 x 12 reps - 15 reps Supine scapula retraction and protraction - 2 lbs 12 reps 2 sets  Push ups on wall and triceps push ups - 12 reps -each  1 x day    Cervical AROM in all planes - and can do scar mobs in comfortable end range cervical rotation  Tape kinesiotape above scar L and R - with achor on PAA -and 3 fingers above scar to anterior neck and cheek To wear - ed on precautions  Mini massager done on anterior neck - fibrosis and circumference from ear to ear decrease by 1 cm  Cica scar pad add for pt to use night time  And kinesiotape to do during day - 20%  pull parallel middle 1/3 of scar and 3 across at 100% pull - to use with cervical AROM    Pt ed and reviiew some modified MLD for axillary ln, anterior anestomosiis, cervical ln , along neck and ear - rework Then under chin , and then lateral neck , and rework several times  Pulley for shoulder ABD AAROM  On wall shoulder flexion AAROM  10 reps 2 x day  Ext rotation stretch in doorway - step thru for light stretch - do not hang fw - 10reps  Pain less than 2./10                   OT Education - 01/25/21 1717     Education Details progress and HEP    Person(s) Educated Patient    Methods Explanation;Demonstration;Tactile cues;Verbal cues;Handout                 OT Long Term Goals - 01/18/21 2154       OT LONG TERM GOAL #1   Title Pt show increase cervical AROM in all planes by 10 degrees to drive with more ease and less pain on posterior neck    Baseline 4-5/10 pain - strain on posterior neck - decrease in all planes - flexion ,ext 30, rot to R 20, L 25, lat flexion L 10, R 20 degrees -    Time 4    Period Weeks    Status New    Target Date 02/15/21      OT LONG TERM GOAL #2   Title Pt to be independent in HEP For MLD , taping , AROM to decrease circumference in neck lymphedema with at least 2 cm    Baseline ear to ear 28 cm - decrease after mini massager in session by 1 cm - no knowledge of MLD , taping or ROM    Time 4    Period Weeks    Status New    Target Date 02/15/21      OT LONG TERM GOAL #3   Title Assess or setup pt with correct compression garment and pump to decrease lymphedema and circumference in neck to decrease risk for pain and swallowing issues    Baseline 28 cm ear to ear - no knowledge of lymphedema management - report if increase swelling - harder time swallowing    Time 6  Period Weeks    Status New    Target Date 03/01/21      OT LONG TERM GOAL #4   Title Bilateral shoulder AROM increase to [facility] and strength to peform over head  reaching without symptoms or pain    Baseline report pain and fatigue over head L more than R - shoulder flexion 120 L , abd 100 adn ext decrease    Time 8    Period Weeks    Status New    Target Date 03/15/21                   Plan - 01/25/21 1718     Clinical Impression Statement Patient present at OT eval last week with diagnosis of head/neck lymphedema - had surgery on December 17, 2020. bilateral salvage neck dissections. Doing fairly well. Starting to notice some swelling under his chin above his incision line as we expected. He notes some tethering more on the right side than the left. Report increase motion in cervical with HEP and L shoulder AROM better if palm  up. Pt removed stitches since last time and able to tolerate and do more scar mobs this date - kinesiotaping done over middle 1/3 scar and pt ed on use as well as CICA scar pad for night time - pt to cont with modify self MLD - and taping on R and L above scar with achor on PAA and 3 fingers above scar on neck and cheek. Pt  pain 4-5/10 pain with some of the scar mobs - pt limited in cervical and shoulder AROM and strength and lymphedema neck- did decrease by 1 cm ear to ear  - all limting his function in ADL and IADL's - pt can benefit form skilled OT services    OT Occupational Profile and History Problem Focused Assessment - Including review of records relating to presenting problem    Occupational performance deficits (Please refer to evaluation for details): ADL's;IADL's;Rest and Sleep;Work;Play;Leisure    Body Structure / Function / Physical Skills ADL;Strength;Decreased knowledge of use of DME;Decreased knowledge of precautions;Skin integrity;Sensation;Flexibility;Scar mobility;ROM;IADL;Edema;UE functional use;Pain    Rehab Potential Good    Clinical Decision Making Limited treatment options, no task modification necessary    Comorbidities Affecting Occupational Performance: None    Modification or Assistance to  Complete Evaluation  No modification of tasks or assist necessary to complete eval    OT Frequency 1x / week    OT Duration 8 weeks    OT Treatment/Interventions Self-care/ADL training;Contrast Bath;Therapeutic exercise;Manual Therapy;Manual lymph drainage;DME and/or AE instruction;Therapeutic activities;Compression bandaging;Scar mobilization;Coping strategies training;Passive range of motion;Patient/family education    Consulted and Agree with Plan of Care Patient             Patient will benefit from skilled therapeutic intervention in order to improve the following deficits and impairments:   Body Structure / Function / Physical Skills: ADL, Strength, Decreased knowledge of use of DME, Decreased knowledge of precautions, Skin integrity, Sensation, Flexibility, Scar mobility, ROM, IADL, Edema, UE functional use, Pain       Visit Diagnosis: Lymphedema, not elsewhere classified  Scar condition and fibrosis of skin  Stiffness of cervical spine  Stiffness of left shoulder, not elsewhere classified  Stiffness of right shoulder, not elsewhere classified  Muscle weakness (generalized)  Cervical pain (neck)    Problem List Patient Active Problem List   Diagnosis Date Noted   Acute pulmonary embolism ([place]) 08/15/2020   Chronic venous insufficiency 08/06/2020  Nausea with vomiting 07/06/2020   Hypomagnesemia 07/06/2020   Hypokalemia 07/06/2020   Counseling regarding advance care planning and goals of care 04/11/2020   Malignant neoplasm of base of tongue ([place]) 04/09/2020   Skin infection 03/11/2020   Tooth infection 03/11/2020   Neck swelling 03/11/2020   Encounter to establish care with new doctor 03/10/2020   Screening, lipid 03/10/2020   Screening for malignant neoplasm of prostate 03/10/2020   Weight gain 03/10/2020   Type 2 diabetes mellitus with hyperglycemia, without long-term current use of insulin ([place]) 03/10/2020   Essential hypertension 03/10/2020     Rosalyn Gess, OTR/L,CLT 01/25/2021, 5:36 PM  [facility] [address], [place], [address] Phone: [phone]   Fax:  [phone]  Name: Joshua Bray MRN: [id number] Date of Birth: 10/27/1960

## 2021-02-01 ENCOUNTER — Ambulatory Visit: Payer: 59 | Admitting: Occupational Therapy

## 2021-02-01 DIAGNOSIS — M25611 Stiffness of right shoulder, not elsewhere classified: Secondary | ICD-10-CM

## 2021-02-01 DIAGNOSIS — I890 Lymphedema, not elsewhere classified: Secondary | ICD-10-CM | POA: Diagnosis not present

## 2021-02-01 DIAGNOSIS — M542 Cervicalgia: Secondary | ICD-10-CM

## 2021-02-01 DIAGNOSIS — L905 Scar conditions and fibrosis of skin: Secondary | ICD-10-CM

## 2021-02-01 DIAGNOSIS — M25612 Stiffness of left shoulder, not elsewhere classified: Secondary | ICD-10-CM

## 2021-02-01 DIAGNOSIS — M6281 Muscle weakness (generalized): Secondary | ICD-10-CM

## 2021-02-01 DIAGNOSIS — M436 Torticollis: Secondary | ICD-10-CM

## 2021-02-01 NOTE — Therapy (Signed)
[facility] [address] [place], [address] Phone: [phone]   Fax:  [phone]  Occupational Therapy Treatment  Patient Details  Name: Joshua Bray MRN: [id number] Date of Birth: Sep 03, 1960 Referring Provider (OT): Dr Rogue Bussing   Encounter Date: 02/01/2021   OT End of Session - 02/01/21 1722     Visit Number 3    Number of Visits 10    Date for OT Re-Evaluation 03/15/21    OT Start Time 1616    OT Stop Time 1714    OT Time Calculation (min) 58 min    Activity Tolerance Patient tolerated treatment well    Behavior During Therapy [facility] for tasks assessed/performed             Past Medical History:  Diagnosis Date   Diabetes mellitus without complication ([place])    Hypertension    Mucositis     Past Surgical History:  Procedure Laterality Date   FEMUR SURGERY Left 1983   IR IMAGING GUIDED PORT INSERTION  04/16/2020   IR REMOVAL TUN ACCESS W/ PORT W/O FL MOD SED  01/07/2021    There were no vitals filed for this visit.   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  July 2022-PET scan mild further reduction in activity in the lymph nodes in the neck. The dominant left cervical lymph node is also mildly reduced in size compared to prior. No recurrence of the tongue base mass.  FNA of the active lesion on PET scan-positive for residual malignancy-Patient reevaluated by surgery at [facility] for surgery on December 17, 2020. - 12/29/2020: Comes back in today after  undergoing bilateral salvage neck dissections. Doing fairly well. Starting to notice some swelling under his chin above his incision line as we expected. He notes some tethering more on the right side than the left. Overall, no issues. Pathology has come back showing treated and did tumors. 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 No/denies                [facility] OT Assessment - 02/01/21 0001       AROM   Right Shoulder Flexion 155 Degrees    Left Shoulder Flexion 156 Degrees    Cervical Flexion 40    Cervical Extension 40    Cervical - Right Side Bend 30    Cervical - Left Side Bend 20    Cervical - Right Rotation 40    Cervical - Left Rotation 35                Pt cont to show increase cervical  AROM in all planes and L shoulder AROM Report that his stitches was coming out and he took  it out himself 2 wks ago      Pt daughter got married and was not able to do a lot of exercises - over done some things the last 2 days and has some pain in L shoulder Pt to do scapula squeezes several times during day RTB for scapula squeezes and shoulder extention 2 x 12 reps Pull downs with RTB this date add  2 x 12 reps - 15 reps Supine scapula retraction and protraction - 2 lbs 12 reps 2 sets  Push ups on wall and triceps push ups - 12 reps -each  1 x day    Cervical AROM in all planes - and can do scar mobs in comfortable end range cervical rotation  T Mini massager done on anterior neck - fibrosis and circumference from ear to ear decrease by 1 cm  And gentle xtractor use - tolerated well  Cica scar pad add for pt to use night time  And kinesiotape to do during day - 20% pull parallel middle 1/3 of scar and 3 across at 100% pull - to use with cervical AROM    Pt ed and  review some modified MLD for axillary ln, anterior anestomosiis, cervical ln , along neck and ear - rework Then under chin , and then lateral neck , and rework several times  Pulley for shoulder ABD AAROM  On wall shoulder flexion AAROM  10 reps 2 x day  Ext rotation stretch in doorway - step thru for light stretch - do not hang fw - 10reps  Pain less than 2./10                             OT Education - 02/01/21 1722     Education Details progress and HEP    Person(s) Educated Patient    Methods Explanation;Demonstration;Tactile cues;Verbal cues;Handout    Comprehension Verbalized understanding;Verbal cues required;Returned demonstration                 OT Long Term Goals - 01/18/21 2154       OT LONG TERM GOAL #1   Title Pt show increase cervical AROM in all planes by 10 degrees to drive with more ease and less pain on posterior neck    Baseline 4-5/10 pain - strain on posterior neck - decrease in all planes - flexion ,ext 30, rot to R 20, L 25, lat flexion L 10, R 20 degrees -    Time 4    Period Weeks    Status New    Target Date 02/15/21      OT LONG TERM GOAL #2   Title Pt to be independent in HEP For MLD , taping , AROM to decrease circumference in neck lymphedema with at least 2 cm    Baseline ear to ear 28 cm - decrease after mini massager in session by 1 cm - no knowledge of MLD , taping or ROM    Time 4    Period Weeks    Status New    Target Date 02/15/21      OT LONG TERM GOAL #3   Title Assess or setup pt with correct compression garment and pump to decrease lymphedema and circumference in neck to decrease risk for pain and swallowing issues    Baseline 28 cm ear to ear - no knowledge of lymphedema management - report if increase swelling - harder time swallowing    Time  6    Period Weeks    Status New    Target Date 03/01/21      OT LONG TERM GOAL #4   Title Bilateral shoulder AROM increase to [facility] and strength to peform over head  reaching without symptoms or pain    Baseline report pain and fatigue over head L more than R - shoulder flexion 120 L , abd 100 adn ext decrease    Time 8    Period Weeks    Status New    Target Date 03/15/21                   Plan - 02/01/21 1723     Clinical Impression Statement Patient refer with diagnosis of head/neck lymphedema and scar tissue - derease ROM  - had surgery on December 17, 2020. bilateral salvage neck dissections. Doing fairly well. Starting to notice some swelling under his chin above his incision line as we expected few wks ago. He noted some tethering more on the right side more  than the left. Pt making great progress the last 2-3 wks in cervical AROM , L shoulder AROM , scar tissue.  Tolerating scar mobs well -  kinesiotaping done over middle 1/3 scar and pt ed on using CICA scar pad for night time - pt to cont with modify self MLD - and taping on R and L above scar with achor on PAA and 3 fingers above scar on neck and cheek. Pt  pain 4-5/10 pain with some of the scar mobs - pt cont to be limited in cervical and shoulder AROM and strength and lymphedema neck- did decrease by 2 cm ear to ear since OT started- will send info to [facility] for possible pump for pt to use for hand and neck lymphedema - cont to be limited in  function in ADL and IADL's - pt can benefit form skilled OT services    OT Occupational Profile and History Problem Focused Assessment - Including review of records relating to presenting problem    Occupational performance deficits (Please refer to evaluation for details): ADL's;IADL's;Rest and Sleep;Work;Play;Leisure    Body Structure / Function / Physical Skills ADL;Strength;Decreased knowledge of use of DME;Decreased knowledge of precautions;Skin integrity;Sensation;Flexibility;Scar mobility;ROM;IADL;Edema;UE functional use;Pain    Rehab Potential Good    Clinical Decision Making Limited treatment options, no task modification  necessary    Comorbidities Affecting Occupational Performance: None    Modification or Assistance to Complete Evaluation  No modification of tasks or assist necessary to complete eval    OT Frequency 1x / week    OT Duration 8 weeks    OT Treatment/Interventions Self-care/ADL training;Contrast Bath;Therapeutic exercise;Manual Therapy;Manual lymph drainage;DME and/or AE instruction;Therapeutic activities;Compression bandaging;Scar mobilization;Coping strategies training;Passive range of motion;Patient/family education    Consulted and Agree with Plan of Care Patient             Patient will benefit from skilled therapeutic intervention in order to improve the following deficits and impairments:   Body Structure / Function / Physical Skills: ADL, Strength, Decreased knowledge of use of DME, Decreased knowledge of precautions, Skin integrity, Sensation, Flexibility, Scar mobility, ROM, IADL, Edema, UE functional use, Pain       Visit Diagnosis: Lymphedema, not elsewhere classified  Scar condition and fibrosis of skin  Stiffness of cervical spine  Stiffness of left shoulder, not elsewhere classified  Stiffness of right shoulder, not elsewhere classified  Muscle weakness (generalized)  Cervical pain (neck)  Problem List Patient Active Problem List   Diagnosis Date Noted   Acute pulmonary embolism ([place]) 08/15/2020   Chronic venous insufficiency 08/06/2020   Nausea with vomiting 07/06/2020   Hypomagnesemia 07/06/2020   Hypokalemia 07/06/2020   Counseling regarding advance care planning and goals of care 04/11/2020   Malignant neoplasm of base of tongue ([place]) 04/09/2020   Skin infection 03/11/2020   Tooth infection 03/11/2020   Neck swelling 03/11/2020   Encounter to establish care with new doctor 03/10/2020   Screening, lipid 03/10/2020   Screening for malignant neoplasm of prostate 03/10/2020   Weight gain 03/10/2020   Type 2 diabetes mellitus with hyperglycemia,  without long-term current use of insulin ([place]) 03/10/2020   Essential hypertension 03/10/2020    Rosalyn Gess, OTR/L,CLT 02/01/2021, 5:28 PM  [facility] [address], [place], [address] Phone: [phone]   Fax:  [phone]  Name: Joshua Bray MRN: [id number] Date of Birth: [birth year]

## 2021-02-02 ENCOUNTER — Encounter: Payer: Self-pay | Admitting: Internal Medicine

## 2021-02-03 ENCOUNTER — Ambulatory Visit: Payer: 59 | Admitting: Occupational Therapy

## 2021-02-03 ENCOUNTER — Other Ambulatory Visit: Payer: Self-pay

## 2021-02-03 ENCOUNTER — Ambulatory Visit
Admission: RE | Admit: 2021-02-03 | Discharge: 2021-02-03 | Disposition: A | Discharge status: Home / Self Care | Payer: 59 | Source: Ambulatory Visit | Attending: Radiation Oncology | Admitting: Radiation Oncology

## 2021-02-03 VITALS — BP 130/77 | HR 85 | Temp 98.2°F | Resp 18 | Wt 272.8 lb

## 2021-02-03 DIAGNOSIS — M25611 Stiffness of right shoulder, not elsewhere classified: Secondary | ICD-10-CM

## 2021-02-03 DIAGNOSIS — Z85810 Personal history of malignant neoplasm of tongue: Secondary | ICD-10-CM | POA: Insufficient documentation

## 2021-02-03 DIAGNOSIS — C01 Malignant neoplasm of base of tongue: Secondary | ICD-10-CM

## 2021-02-03 DIAGNOSIS — I890 Lymphedema, not elsewhere classified: Secondary | ICD-10-CM

## 2021-02-03 DIAGNOSIS — R1310 Dysphagia, unspecified: Secondary | ICD-10-CM | POA: Insufficient documentation

## 2021-02-03 DIAGNOSIS — Z9221 Personal history of antineoplastic chemotherapy: Secondary | ICD-10-CM | POA: Insufficient documentation

## 2021-02-03 DIAGNOSIS — M436 Torticollis: Secondary | ICD-10-CM

## 2021-02-03 DIAGNOSIS — C770 Secondary and unspecified malignant neoplasm of lymph nodes of head, face and neck: Secondary | ICD-10-CM | POA: Insufficient documentation

## 2021-02-03 DIAGNOSIS — L905 Scar conditions and fibrosis of skin: Secondary | ICD-10-CM

## 2021-02-03 DIAGNOSIS — M25612 Stiffness of left shoulder, not elsewhere classified: Secondary | ICD-10-CM

## 2021-02-03 DIAGNOSIS — M6281 Muscle weakness (generalized): Secondary | ICD-10-CM

## 2021-02-03 DIAGNOSIS — Z923 Personal history of irradiation: Secondary | ICD-10-CM | POA: Insufficient documentation

## 2021-02-03 NOTE — Therapy (Signed)
[facility] [address] [place], [address] Phone: [phone]   Fax:  [phone]  Occupational Therapy Treatment  Patient Details  Name: Joshua Bray MRN: [id number] Date of Birth: 1961-04-13 Referring Provider (OT): Dr Rogue Bussing   Encounter Date: 02/03/2021   OT End of Session - 02/03/21 1943     Visit Number 4    Number of Visits 10    Date for OT Re-Evaluation 03/15/21    OT Start Time 1530    OT Stop Time 1605    OT Time Calculation (min) 35 min    Activity Tolerance Patient tolerated treatment well    Behavior During Therapy [facility] for tasks assessed/performed             Past Medical History:  Diagnosis Date   Diabetes mellitus without complication ([place])    Hypertension    Mucositis     Past Surgical History:  Procedure Laterality Date   FEMUR SURGERY Left 1983   IR IMAGING GUIDED PORT INSERTION  04/16/2020   IR REMOVAL TUN ACCESS W/ PORT W/O FL MOD SED  01/07/2021    There were no vitals filed for this visit.   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  July 2022-PET scan mild further reduction in activity in the lymph nodes in the neck. The dominant left cervical lymph node is also mildly reduced in size compared to prior. No recurrence of the tongue base mass.  FNA of the active lesion on PET scan-positive for residual malignancy-Patient reevaluated by surgery at [facility] for surgery on December 17, 2020. - 12/29/2020: Comes back in today after undergoing bilateral salvage neck  dissections. Doing fairly well. Starting to notice some swelling under his chin above his incision line as we expected. He notes some tethering more on the right side than the left. Overall, no issues. Pathology has come back showing treated and did tumors. 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 No/denies                 Pt cont to show increase cervical  AROM in all planes and L shoulder AROM Report that his stitches was coming out and he took it out himself 2 wks ago      Pt to cont with scapula squeezes several times during day RTB for scapula squeezes and shoulder extention 2 x 12 reps Pull downs with RTB this date add  2 x 12 reps - 15 reps Supine scapula retraction and protraction - 2 lbs 12 reps 2 sets  Push ups on wall and triceps push ups - 12 reps -each  1 x day    Cervical AROM in all planes - and can do scar mobs in comfortable end range cervical  rotation  Mini massager done on anterior neck - fibrosis and circumference from ear to ear decrease by  another 1 cm this past week  And gentle xtractor use - tolerated well  Cica scar pad add for pt to use night time  And kinesiotape to do during day - 20% pull parallel middle 1/3 of scar and 3 across at 100% pull - to use with cervical AROM    Pt ed and review some modified MLD for axillary ln, anterior anestomosiis, cervical ln , along neck and ear - rework Then under chin , and then lateral neck , and rework several times  Pulley for shoulder ABD AAROM  On wall shoulder flexion AAROM  10 reps 2 x day  Ext rotation stretch in doorway - step thru for light stretch - do not hang fw - 10reps  Pain less than 2./10                            OT Education - 02/03/21 1943     Education Details  progress and HEP    Person(s) Educated Patient    Methods Explanation;Demonstration;Tactile cues;Verbal cues;Handout    Comprehension Verbalized understanding;Verbal cues required;Returned demonstration                 OT Long Term Goals - 01/18/21 2154       OT LONG TERM GOAL #1   Title Pt show increase cervical AROM in all planes by 10 degrees to drive with more ease and less pain on posterior neck    Baseline 4-5/10 pain - strain on posterior neck - decrease in all planes - flexion ,ext 30, rot to R 20, L 25, lat flexion L 10, R 20 degrees -    Time 4    Period Weeks    Status New    Target Date 02/15/21      OT LONG TERM GOAL #2   Title Pt to be independent in HEP For MLD , taping , AROM to decrease circumference in neck lymphedema with at least 2 cm    Baseline ear to ear 28 cm - decrease after mini massager in session by 1 cm - no knowledge of MLD , taping or ROM    Time 4    Period Weeks    Status New    Target Date 02/15/21      OT LONG TERM GOAL #3   Title Assess or setup pt with correct compression garment and pump to decrease lymphedema and circumference in neck to decrease risk for pain and swallowing issues    Baseline 28 cm ear to ear - no knowledge of lymphedema management - report if increase swelling - harder time swallowing    Time 6    Period Weeks    Status New    Target Date 03/01/21      OT LONG TERM GOAL #4   Title Bilateral shoulder AROM increase to [facility] and strength to peform over head reaching without symptoms or pain    Baseline report pain and fatigue over head L more than R - shoulder flexion 120 L , abd 100 adn ext decrease    Time 8    Period Weeks    Status New    Target Date 03/15/21                   Plan - 02/03/21 1943     Clinical Impression  Statement Patient refer with diagnosis of head/neck lymphedema and scar tissue - derease ROM  - had surgery on December 17, 2020. bilateral salvage neck dissections. Doing fairly  well. Starting to notice some swelling under his chin above his incision line as we expected few wks ago. He noted some tethering more on the right side more  than the left. Pt making great progress the last 2-3 wks in cervical AROM , L shoulder AROM , scar tissue.  Tolerating scar mobs well -  kinesiotaping done over middle 1/3 scar and pt ed on using CICA scar pad for night time - pt to cont with modify self MLD - and taping on R and L above scar with achor on PAA and 3 fingers above scar on neck and cheek. Pt  pain 4-5/10 pain with some of the scar mobs - pt cont to be limited in cervical and shoulder AROM and strength and lymphedema neck- did decrease by 2 cm ear to ear since OT started- Pt can benefit from flexitouch pump to use for hand and neck lymphedema- checking on benefits - cont to be limited in  function in ADL and IADL's - pt can benefit form skilled OT services    OT Occupational Profile and History Problem Focused Assessment - Including review of records relating to presenting problem    Occupational performance deficits (Please refer to evaluation for details): ADL's;IADL's;Rest and Sleep;Work;Play;Leisure    Body Structure / Function / Physical Skills ADL;Strength;Decreased knowledge of use of DME;Decreased knowledge of precautions;Skin integrity;Sensation;Flexibility;Scar mobility;ROM;IADL;Edema;UE functional use;Pain    Rehab Potential Good    Clinical Decision Making Limited treatment options, no task modification necessary    Comorbidities Affecting Occupational Performance: None    Modification or Assistance to Complete Evaluation  No modification of tasks or assist necessary to complete eval    OT Frequency 1x / week    OT Duration 6 weeks    OT Treatment/Interventions Self-care/ADL training;Contrast Bath;Therapeutic exercise;Manual Therapy;Manual lymph drainage;DME and/or AE instruction;Therapeutic activities;Compression bandaging;Scar mobilization;Coping strategies  training;Passive range of motion;Patient/family education    Consulted and Agree with Plan of Care Patient             Patient will benefit from skilled therapeutic intervention in order to improve the following deficits and impairments:   Body Structure / Function / Physical Skills: ADL, Strength, Decreased knowledge of use of DME, Decreased knowledge of precautions, Skin integrity, Sensation, Flexibility, Scar mobility, ROM, IADL, Edema, UE functional use, Pain       Visit Diagnosis: Lymphedema, not elsewhere classified  Scar condition and fibrosis of skin  Stiffness of cervical spine  Stiffness of left shoulder, not elsewhere classified  Stiffness of right shoulder, not elsewhere classified  Muscle weakness (generalized)    Problem List Patient Active Problem List   Diagnosis Date Noted   Acute pulmonary embolism ([place]) 08/15/2020   Chronic venous insufficiency 08/06/2020   Nausea with vomiting 07/06/2020   Hypomagnesemia 07/06/2020   Hypokalemia 07/06/2020   Counseling regarding advance care planning and goals of care 04/11/2020   Malignant neoplasm of base of tongue ([place]) 04/09/2020   Skin infection 03/11/2020   Tooth infection 03/11/2020   Neck swelling 03/11/2020   Encounter to establish care with new doctor 03/10/2020   Screening, lipid 03/10/2020   Screening for malignant neoplasm of prostate 03/10/2020   Weight gain 03/10/2020   Type 2 diabetes mellitus with hyperglycemia, without long-term current use of insulin ([place]) 03/10/2020   Essential hypertension 03/10/2020  Rosalyn Gess, OTR/L,CLT 02/03/2021, 7:46 PM  [facility] [address], [place], [address] Phone: [phone]   Fax:  [phone]  Name: Joshua Bray MRN: [id number] Date of Birth: 04/14/1961

## 2021-02-04 NOTE — Progress Notes (Signed)
Radiation Oncology Follow up Note  Name: Joshua Bray   Date:   02/03/2021 MRN:  [id number] DOB: [birth year]    This 60 y.o. 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  He is seen today in routine follow-up.  He is recently had bilateral neck dissections at.  [facility] showing no evidence of residual metastatic disease in cervical lymph nodes.  His neck incision is healing slowly and he is doing well.  He states his mouth is somewhat dry has some mild dysphagia no head and neck pain.  He has lost considerable weight although that was beneficial since he was morbidly obese.  He did have a PET/CT in June which I have reviewed showing further reduction activity of the lymph nodes in the neck also the dominant left cervical lymph node is markedly reduced in size compared to prior exam.  No evidence of recurrence of the base of tongue seen.  COMPLICATIONS OF TREATMENT: none  FOLLOW UP COMPLIANCE: keeps appointments   PHYSICAL EXAM:  BP 130/77 (BP Location: Right Arm, Patient Position: Sitting, Cuff Size: Large)   Pulse 85   Temp 98.2 F (36.8 C) (Tympanic)   Resp 18   Wt 272 lb 12.8 oz (123.7 kg)   SpO2 96%   BMI 38.05 kg/m  Patient is has a horizontal incision of the neck which is healing well.  No evidence of adenopathy in the neck he does have some mild lymphedema of the neck.  Well-developed well-nourished patient in NAD. HEENT reveals PERLA, EOMI, discs not visualized.  Oral cavity is clear. No oral mucosal lesions are identified.  Neck is clear without evidence of cervical or supraclavicular adenopathy. Lungs are clear to A&P. Cardiac examination is essentially unremarkable with regular rate and rhythm without murmur rub or thrill. Abdomen is benign with no organomegaly or masses noted. Motor sensory and DTR levels are equal and symmetric in the upper and lower extremities. Cranial nerves II through XII are grossly intact. Proprioception is intact. No peripheral adenopathy or edema is identified. No motor or sensory levels are noted. Crude visual fields are within normal range.  RADIOLOGY RESULTS: PET CT scan reviewed compatible with above-stated findings  PLAN: Present time patient continues to do well with no evidence of disease.  Very impressed he had no residual disease in his cervical lymph nodes at the time of bilateral neck dissection.  I have asked to see him back in 6 months for follow-up.  I have asked him to inquire from his ENT about Pilocar binder or some other salivary stimulant.  Patient knows to call with any concerns.  I would like to take this opportunity to thank you for allowing me to participate in the care of your patient.Noreene Filbert, MD

## 2021-02-08 ENCOUNTER — Ambulatory Visit: Admit: 2021-02-08 | Payer: 59 | Admitting: Gastroenterology

## 2021-02-08 ENCOUNTER — Ambulatory Visit: Payer: 59 | Attending: Internal Medicine | Admitting: Occupational Therapy

## 2021-02-08 DIAGNOSIS — M25611 Stiffness of right shoulder, not elsewhere classified: Secondary | ICD-10-CM | POA: Insufficient documentation

## 2021-02-08 DIAGNOSIS — I890 Lymphedema, not elsewhere classified: Secondary | ICD-10-CM | POA: Insufficient documentation

## 2021-02-08 DIAGNOSIS — M6281 Muscle weakness (generalized): Secondary | ICD-10-CM | POA: Diagnosis present

## 2021-02-08 DIAGNOSIS — M25612 Stiffness of left shoulder, not elsewhere classified: Secondary | ICD-10-CM | POA: Diagnosis present

## 2021-02-08 DIAGNOSIS — L905 Scar conditions and fibrosis of skin: Secondary | ICD-10-CM | POA: Diagnosis present

## 2021-02-08 DIAGNOSIS — M436 Torticollis: Secondary | ICD-10-CM | POA: Insufficient documentation

## 2021-02-08 DIAGNOSIS — M542 Cervicalgia: Secondary | ICD-10-CM | POA: Diagnosis present

## 2021-02-08 SURGERY — COLONOSCOPY WITH PROPOFOL
Anesthesia: General

## 2021-02-08 NOTE — Therapy (Signed)
[facility] [address], [place], [address] Phone: [phone]   Fax:  [phone]  Occupational Therapy Treatment  Patient Details  Name: Joshua Bray MRN: [id number] Date of Birth: 1960-06-18 Referring Provider (OT): Dr Rogue Bussing   Encounter Date: 02/08/2021   OT End of Session - 02/08/21 1719     Visit Number 5    Number of Visits 10    Date for OT Re-Evaluation 03/15/21    OT Start Time 1450    OT Stop Time 1538    OT Time Calculation (min) 48 min    Activity Tolerance Patient tolerated treatment well    Behavior During Therapy [facility] for tasks assessed/performed             Past Medical History:  Diagnosis Date   Diabetes mellitus without complication ([place])    Hypertension    Mucositis     Past Surgical History:  Procedure Laterality Date   FEMUR SURGERY Left 1983   IR IMAGING GUIDED PORT INSERTION  04/16/2020   IR REMOVAL TUN ACCESS W/ PORT W/O FL MOD SED  01/07/2021    There were no vitals filed for this visit.   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  July 2022-PET scan mild further reduction in activity in the lymph nodes in the neck. The dominant left cervical lymph node is also mildly reduced in size compared to prior. No recurrence of the tongue base mass.  FNA of the active lesion on PET scan-positive for residual malignancy-Patient reevaluated by surgery at [facility] for surgery on December 17, 2020. - 12/29/2020: Comes back in today after  undergoing bilateral salvage neck dissections. Doing fairly well. Starting to notice some swelling under his chin above his incision line as we expected. He notes some tethering more on the right side than the left. Overall, no issues. Pathology has come back showing treated and did tumors. 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 No/denies                [facility] OT Assessment - 02/08/21 0001       AROM   Cervical Flexion 40    Cervical - Right Side Bend 35    Cervical - Left Side Bend 20    Cervical - Right Rotation 45    Cervical - Left Rotation 40                Pt cont to show increase cervical  AROM in all planes and L shoulder AROM       Pt to cont with scapula squeezes several times during day RTB for scapula squeezes and shoulder extention 2 x 12 reps  Pull downs with RTB this date add  2 x 12 reps - 15 reps Supine scapula retraction and protraction - 2 lbs 12 reps 2 sets  Push ups on wall and triceps push ups - 12 reps -each  1 x day  Done this date biodex - scapula squeezes 20 reps x 2 at 20 and 25 lbs  Chest press - fw - 20 lbs - 20 reps - some difficulty towards end - fatigue    Cervical AROM in all planes - and can do scar mobs in comfortable end range cervical rotation  Mini massager done on anterior neck - fibrosis decrease but measurement ear to ear was increase again 27 cm  ACica scar pad add for pt to use night time  And kinesiotape to do during day - did star on anterior 1/3 of scar a- better adhere - to use with cervical AROM  To do at home -and fingers on lateral cheeks with achor to posterior PAA - for lymph drainage -behind upper traps    Done modified MLD for axillary ln, anterior anestomosiis, cervical ln , along neck and ear - rework Then  under chin , and then lateral neck , and rework several times Cont at home   Pulley for shoulder ABD AAROM  On wall shoulder flexion AAROM  10 reps 2 x day  Ext rotation stretch in doorway - step thru for light stretch - do not hang fw - 10reps  Pain less than 2./10                     OT Education - 02/08/21 1719     Education Details progress and HEP    Person(s) Educated Patient    Methods Explanation;Demonstration;Tactile cues;Verbal cues;Handout    Comprehension Verbalized understanding;Verbal cues required;Returned demonstration                 OT Long Term Goals - 01/18/21 2154       OT LONG TERM GOAL #1   Title Pt show increase cervical AROM in all planes by 10 degrees to drive with more ease and less pain on posterior neck    Baseline 4-5/10 pain - strain on posterior neck - decrease in all planes - flexion ,ext 30, rot to R 20, L 25, lat flexion L 10, R 20 degrees -    Time 4    Period Weeks    Status New    Target Date 02/15/21      OT LONG TERM GOAL #2   Title Pt to be independent in HEP For MLD , taping , AROM to decrease circumference in neck lymphedema with at least 2 cm    Baseline ear to ear 28 cm - decrease after mini massager in session by 1 cm - no knowledge of MLD , taping or ROM    Time 4    Period Weeks    Status New    Target Date 02/15/21      OT LONG TERM GOAL #3   Title Assess or setup pt with correct compression garment and pump to decrease lymphedema and circumference in neck to decrease risk for pain and swallowing issues    Baseline 28 cm ear to ear - no knowledge of lymphedema management - report if increase swelling - harder time swallowing    Time 6    Period Weeks    Status New    Target Date 03/01/21      OT  LONG TERM GOAL #4   Title Bilateral shoulder AROM increase to [facility] and strength to peform over head reaching without symptoms or pain    Baseline report pain and fatigue over head L more than R - shoulder  flexion 120 L , abd 100 adn ext decrease    Time 8    Period Weeks    Status New    Target Date 03/15/21                   Plan - 02/08/21 1721     Clinical Impression Statement Patient refer with diagnosis of head/neck lymphedema and scar tissue - derease ROM  - had surgery on December 17, 2020. bilateral salvage neck dissections. Doing fairly well. Starting to notice some swelling under his chin above his incision line as we expected few wks ago. He noted some tethering more on the right side more  than the left. Pt making great progress the last 2-4 wks in cervical AROM , L shoulder AROM , scar tissue.  Tolerating scar mobs well today with less discomfort -  kinesiotaping done over middle 1/3 scar using star techniques and fingers on lateral cheeks posterior to achor at behind upper taps - Pt using CICA scar pad for night time - pt to cont with modify self MLD - and taping on R and L above scar with achor on PAA and 3 fingers above scar on neck and cheek. Pt cont to be limited in cervical and shoulder AROM and strength and lymphedema neck- was increase to 27 cm ear to ear again today but less fibrosis - and more lateral this date and not under chin  Pt can benefit from flexitouch pump to use for hand and neck lymphedema- checking on benefits - pt in agreement to have them do trial in the clinic - with pre and post measurements -  cont to be limited in  function in ADL and IADL's - pt can benefit form skilled OT services    OT Occupational Profile and History Problem Focused Assessment - Including review of records relating to presenting problem    Occupational performance deficits (Please refer to evaluation for details): ADL's;IADL's;Rest and Sleep;Work;Play;Leisure    Body Structure / Function / Physical Skills ADL;Strength;Decreased knowledge of use of DME;Decreased knowledge of precautions;Skin integrity;Sensation;Flexibility;Scar mobility;ROM;IADL;Edema;UE functional use;Pain    Rehab  Potential Good    Clinical Decision Making Limited treatment options, no task modification necessary    Comorbidities Affecting Occupational Performance: None    Modification or Assistance to Complete Evaluation  No modification of tasks or assist necessary to complete eval    OT Frequency 1x / week    OT Duration 6 weeks    OT Treatment/Interventions Self-care/ADL training;Contrast Bath;Therapeutic exercise;Manual Therapy;Manual lymph drainage;DME and/or AE instruction;Therapeutic activities;Compression bandaging;Scar mobilization;Coping strategies training;Passive range of motion;Patient/family education    Consulted and Agree with Plan of Care Patient             Patient will benefit from skilled therapeutic intervention in order to improve the following deficits and impairments:   Body Structure / Function / Physical Skills: ADL, Strength, Decreased knowledge of use of DME, Decreased knowledge of precautions, Skin integrity, Sensation, Flexibility, Scar mobility, ROM, IADL, Edema, UE functional use, Pain       Visit Diagnosis: Lymphedema, not elsewhere classified  Scar condition and fibrosis of skin  Stiffness of cervical spine  Stiffness of left shoulder, not elsewhere classified  Stiffness of right shoulder, not elsewhere  classified  Muscle weakness (generalized)  Cervical pain (neck)    Problem List Patient Active Problem List   Diagnosis Date Noted   Acute pulmonary embolism ([place]) 08/15/2020   Chronic venous insufficiency 08/06/2020   Nausea with vomiting 07/06/2020   Hypomagnesemia 07/06/2020   Hypokalemia 07/06/2020   Counseling regarding advance care planning and goals of care 04/11/2020   Malignant neoplasm of base of tongue ([place]) 04/09/2020   Skin infection 03/11/2020   Tooth infection 03/11/2020   Neck swelling 03/11/2020   Encounter to establish care with new doctor 03/10/2020   Screening, lipid 03/10/2020   Screening for malignant neoplasm of  prostate 03/10/2020   Weight gain 03/10/2020   Type 2 diabetes mellitus with hyperglycemia, without long-term current use of insulin ([place]) 03/10/2020   Essential hypertension 03/10/2020    Rosalyn Gess, OTR/L,CLT 02/08/2021, 5:25 PM  [facility] [address], [place], [address] Phone: [phone]   Fax:  [phone]  Name: Joshua Bray MRN: [id number] Date of Birth: [birth year]

## 2021-02-09 MED FILL — CYCLOBENZAPRINE 5 MG TABLET: 10 days supply | Qty: 30 | Fill #0

## 2021-02-17 ENCOUNTER — Ambulatory Visit: Payer: 59 | Admitting: Occupational Therapy

## 2021-02-17 DIAGNOSIS — I890 Lymphedema, not elsewhere classified: Secondary | ICD-10-CM

## 2021-02-17 DIAGNOSIS — M436 Torticollis: Secondary | ICD-10-CM

## 2021-02-17 DIAGNOSIS — M25612 Stiffness of left shoulder, not elsewhere classified: Secondary | ICD-10-CM

## 2021-02-17 DIAGNOSIS — L905 Scar conditions and fibrosis of skin: Secondary | ICD-10-CM

## 2021-02-17 NOTE — Therapy (Signed)
[facility] [address] [place], [address] Phone: [phone]   Fax:  [phone]  Occupational Therapy Treatment  Patient Details  Name: Joshua Bray MRN: [id number] Date of Birth: [birth year] Referring Provider (OT): Dr Rogue Bussing   Encounter Date: 02/17/2021   OT End of Session - 02/17/21 1208     Visit Number 6    Number of Visits 10    Date for OT Re-Evaluation 03/15/21    OT Start Time 1115    OT Stop Time 1230    OT Time Calculation (min) 75 min    Activity Tolerance Patient tolerated treatment well    Behavior During Therapy [facility] for tasks assessed/performed             Past Medical History:  Diagnosis Date   Diabetes mellitus without complication ([place])    Hypertension    Mucositis     Past Surgical History:  Procedure Laterality Date   FEMUR SURGERY Left 1983   IR IMAGING GUIDED PORT INSERTION  04/16/2020   IR REMOVAL TUN ACCESS W/ PORT W/O FL MOD SED  01/07/2021    There were no vitals filed for this visit.   Subjective Assessment - 02/17/21 1204     Subjective  Seen the surgeon since last time- scar tissue little thicker than he tought it will be - swelling expected. 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  July 2022-PET scan mild further reduction in activity in the lymph nodes in the neck. The dominant left cervical lymph node is also mildly reduced in size compared to prior. No recurrence of the tongue base mass.  FNA of the active lesion on PET scan-positive for residual malignancy-Patient reevaluated by surgery at [facility] for surgery on December 17, 2020. - 12/29/2020: Comes back in today after undergoing bilateral salvage neck  dissections. Doing fairly well. Starting to notice some swelling under his chin above his incision line as we expected. He notes some tethering more on the right side than the left. Overall, no issues. Pathology has come back showing treated and did tumors. 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 No/denies                [facility] OT Assessment - 02/17/21 0001       AROM   Cervical Flexion 40    Cervical Extension 40    Cervical - Right Side Bend 30    Cervical - Left Side Bend 25    Cervical - Right Rotation 35   incrase after flexitouch to 45   Cervical - Left Rotation 32   increase after flexitouch to 40                 Pt report increase strength and AROM in bilateral shoulders- using RTB  External rotation on the R 3/5 this date but could not do Red or yellow band Provide pt with isometric to attempt  Pt to cont with scapula squeezes several times during day RTB for scapula squeezes and shoulder extention 2 x 12 reps Pull downs with RTB this date add  2 x 12 reps - 15 reps Supine scapula retraction and protraction - 2 lbs 12 reps 2 sets  Push ups on wall and triceps push ups - 12 reps -each  1 x day    Cervical AROM in all planes - was decrease today coming in - increase tightness and fibrosis - but after demo of Flexitouch head and neck pump - pt show increase AROM for rotation and lat flexion of cervical  Chip bag provided for pt to use  under chin fibrosis at home with head band    [employer] scar pad add for pt to use night time  Done flexitouch  pump for about 30 min today - had decrease in lymphedema under jaw and less fibrosis - but measurement same Tolerated well                    OT Education - 02/17/21 1207     Education Details  progress and HEP    Person(s) Educated Patient    Methods Explanation;Demonstration;Tactile cues;Verbal cues;Handout    Comprehension Verbalized understanding;Verbal cues required;Returned demonstration                 OT Long Term Goals - 01/18/21 2154       OT LONG TERM GOAL #1   Title Pt show increase cervical AROM in all planes by 10 degrees to drive with more ease and less pain on posterior neck    Baseline 4-5/10 pain - strain on posterior neck - decrease in all planes - flexion ,ext 30, rot to R 20, L 25, lat flexion L 10, R 20 degrees -    Time 4    Period Weeks    Status New    Target Date 02/15/21      OT LONG TERM GOAL #2   Title Pt to be independent in HEP For MLD , taping , AROM to decrease circumference in neck lymphedema with at least 2 cm    Baseline ear to ear 28 cm - decrease after mini massager in session by 1 cm - no knowledge of MLD , taping or ROM    Time 4    Period Weeks    Status New    Target Date 02/15/21      OT LONG TERM GOAL #3   Title Assess or setup pt with correct compression garment and pump to decrease lymphedema and circumference in neck to decrease risk for pain and swallowing issues    Baseline 28 cm ear to ear - no knowledge of lymphedema management - report if increase swelling - harder time swallowing    Time 6    Period Weeks    Status New    Target Date 03/01/21      OT LONG TERM GOAL #4   Title Bilateral shoulder AROM increase to [facility] and strength to peform over head reaching without symptoms or pain    Baseline report pain and fatigue over head L more than R - shoulder flexion 120 L , abd 100 adn ext decrease    Time 8    Period Weeks    Status New    Target Date 03/15/21                   Plan - 02/17/21 1208  Clinical Impression Statement Patient refer with diagnosis of head/neck lymphedema and scar tissue - derease ROM  - had surgery on December 17, 2020. bilateral salvage neck dissections. Doing fairly  well.  At the eval he had swelling under his chin above his incision line and tethering more on the right side  than the left. Pt made great progress since eval in cervical AROM , L shoulder AROM , scar tissue.  Tolerating scar mobs well lately with less discomfort -  kinesiotaping done  but pt report hard time keeping it on for lymph drainage and scar mobs because of hair growth. Pt to cont with CICA scar pad for night time - pt to cont with modify self MLD. Pt showed increase shoulder AROM and strength but show decrease ext rotation strength on the R this date - increase lymphedema under jaw line and fibrosis under chin since last time - measurements not decreasing anymore - increase tightness and decrease cervical AROM compare to last time. Flexitouch pump Rep today here to do trial on pt - report less fibrosis afterwards and decrease lymphedema under jaw line- showed increase AROM in cervical lat flexion and rotation compare to prior to pump. Pt can benefit from flexitouch pump to use at home. Pt cont to be limited in cervical  AROM and shoulder strength. Lymphedema neck- was increase to 27 cm ear to ear again today. Pt can benefit from flexitouch pump to use for head and neck lymphedema- checking on benefits and order from surgeon -  cont to be limited in  function in ADL and IADL's - pt can benefit form skilled OT services    OT Occupational Profile and History Problem Focused Assessment - Including review of records relating to presenting problem    Occupational performance deficits (Please refer to evaluation for details): ADL's;IADL's;Rest and Sleep;Work;Play;Leisure    Body Structure / Function / Physical Skills ADL;Strength;Decreased knowledge of use of DME;Decreased knowledge of precautions;Skin integrity;Sensation;Flexibility;Scar mobility;ROM;IADL;Edema;UE functional use;Pain    Rehab Potential Good    Clinical Decision Making Limited treatment options, no task modification necessary     Comorbidities Affecting Occupational Performance: None    Modification or Assistance to Complete Evaluation  No modification of tasks or assist necessary to complete eval    OT Frequency 1x / week    OT Duration 6 weeks    OT Treatment/Interventions Self-care/ADL training;Contrast Bath;Therapeutic exercise;Manual Therapy;Manual lymph drainage;DME and/or AE instruction;Therapeutic activities;Compression bandaging;Scar mobilization;Coping strategies training;Passive range of motion;Patient/family education    Consulted and Agree with Plan of Care Patient             Patient will benefit from skilled therapeutic intervention in order to improve the following deficits and impairments:   Body Structure / Function / Physical Skills: ADL, Strength, Decreased knowledge of use of DME, Decreased knowledge of precautions, Skin integrity, Sensation, Flexibility, Scar mobility, ROM, IADL, Edema, UE functional use, Pain       Visit Diagnosis: Lymphedema, not elsewhere classified  Scar condition and fibrosis of skin  Stiffness of cervical spine  Stiffness of left shoulder, not elsewhere classified    Problem List Patient Active Problem List   Diagnosis Date Noted   Acute pulmonary embolism ([place]) 08/15/2020   Chronic venous insufficiency 08/06/2020   Nausea with vomiting 07/06/2020   Hypomagnesemia 07/06/2020   Hypokalemia 07/06/2020   Counseling regarding advance care planning and goals of care 04/11/2020   Malignant neoplasm of base of tongue ([place]) 04/09/2020   Skin infection 03/11/2020  Tooth infection 03/11/2020   Neck swelling 03/11/2020   Encounter to establish care with new doctor 03/10/2020   Screening, lipid 03/10/2020   Screening for malignant neoplasm of prostate 03/10/2020   Weight gain 03/10/2020   Type 2 diabetes mellitus with hyperglycemia, without long-term current use of insulin ([place]) 03/10/2020   Essential hypertension 03/10/2020    Rosalyn Gess,  OTR/L,CLT 02/17/2021, 1:24 PM  [facility] [address] [place], [address] Phone: [phone]   Fax:  [phone]  Name: Joshua Bray MRN: [id number] Date of Birth: 1960-06-03

## 2021-02-22 ENCOUNTER — Ambulatory Visit: Payer: 59 | Admitting: Occupational Therapy

## 2021-02-22 DIAGNOSIS — M6281 Muscle weakness (generalized): Secondary | ICD-10-CM

## 2021-02-22 DIAGNOSIS — I890 Lymphedema, not elsewhere classified: Secondary | ICD-10-CM | POA: Diagnosis not present

## 2021-02-22 DIAGNOSIS — M436 Torticollis: Secondary | ICD-10-CM

## 2021-02-22 DIAGNOSIS — L905 Scar conditions and fibrosis of skin: Secondary | ICD-10-CM

## 2021-02-22 DIAGNOSIS — M25612 Stiffness of left shoulder, not elsewhere classified: Secondary | ICD-10-CM

## 2021-02-22 DIAGNOSIS — M542 Cervicalgia: Secondary | ICD-10-CM

## 2021-02-22 DIAGNOSIS — M25611 Stiffness of right shoulder, not elsewhere classified: Secondary | ICD-10-CM

## 2021-02-22 NOTE — Therapy (Signed)
[facility] [address] [place], [address] Phone: [phone]   Fax:  [phone]  Occupational Therapy Treatment  Patient Details  Name: Joshua Bray MRN: [id number] Date of Birth: 01-25-1961 Referring Provider (OT): Dr Rogue Bussing   Encounter Date: 02/22/2021   OT End of Session - 02/22/21 1621     Visit Number 7    Number of Visits 10    Date for OT Re-Evaluation 03/15/21    OT Start Time 1448    OT Stop Time 1618    OT Time Calculation (min) 48 min    Activity Tolerance Patient tolerated treatment well    Behavior During Therapy [facility] for tasks assessed/performed             Past Medical History:  Diagnosis Date   Diabetes mellitus without complication ([place])    Hypertension    Mucositis     Past Surgical History:  Procedure Laterality Date   FEMUR SURGERY Left 1983   IR IMAGING GUIDED PORT INSERTION  04/16/2020   IR REMOVAL TUN ACCESS W/ PORT W/O FL MOD SED  01/07/2021    There were no vitals filed for this visit.   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  July 2022-PET scan mild further reduction in activity in the lymph nodes in the neck. The dominant left cervical lymph node is also mildly reduced in size compared to prior. No recurrence of the tongue base mass.  FNA of the active lesion on PET scan-positive for residual malignancy-Patient reevaluated by surgery at [facility] for surgery on December 17, 2020. - 12/29/2020: Comes back in today after undergoing bilateral salvage neck dissections. Doing fairly well. Starting to notice some swelling under his chin above his incision  line as we expected. He notes some tethering more on the right side than the left. Overall, no issues. Pathology has come back showing treated and did tumors. 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 No/denies                    Pt to cont with scapula squeezes several times during day Upgrade to green band for scapula squeezes and shoulder extention 2 x 12 reps Pull downs with RTB and L ext rotation 2 x 12 reps - 15 reps Supine scapula retraction and protraction - 2 lbs 12 reps 2 sets  Push ups on wall and triceps push ups - 12 reps -each  1 x day  Sidelying for R ext rotation - 3 x 6 reps - no weight and not more reps    Cervical AROM in all planes - and can do scar mobs in comfortable end range cervical rotation  Mini massager done on scar on neck and extractor this date with some AROM in to extention and rotation   Cont Cica scar pad  pt to use night time  OT Education - 02/22/21 1621     Education Details progress and HEP    Person(s) Educated Patient    Methods Explanation;Demonstration;Tactile cues;Verbal cues;Handout    Comprehension Verbalized understanding;Verbal cues required;Returned demonstration                 OT Long Term Goals - 01/18/21 2154       OT LONG TERM GOAL #1   Title Pt show increase cervical AROM in all planes by 10 degrees to drive with more ease and less pain on posterior neck    Baseline 4-5/10 pain - strain on posterior neck - decrease in all planes - flexion ,ext 30, rot to R 20, L 25, lat flexion L 10, R 20 degrees -    Time 4    Period Weeks    Status New    Target Date 02/15/21      OT LONG TERM GOAL #2   Title Pt to be independent in HEP For MLD , taping , AROM to decrease circumference in  neck lymphedema with at least 2 cm    Baseline ear to ear 28 cm - decrease after mini massager in session by 1 cm - no knowledge of MLD , taping or ROM    Time 4    Period Weeks    Status New    Target Date 02/15/21      OT LONG TERM GOAL #3   Title Assess or setup pt with correct compression garment and pump to decrease lymphedema and circumference in neck to decrease risk for pain and swallowing issues    Baseline 28 cm ear to ear - no knowledge of lymphedema management - report if increase swelling - harder time swallowing    Time 6    Period Weeks    Status New    Target Date 03/01/21      OT LONG TERM GOAL #4   Title Bilateral shoulder AROM increase to [facility] and strength to peform over head reaching without symptoms or pain    Baseline report pain and fatigue over head L more than R - shoulder flexion 120 L , abd 100 adn ext decrease    Time 8    Period Weeks    Status New    Target Date 03/15/21                   Plan - 02/22/21 1622     Clinical Impression Statement Patient refer with diagnosis of head/neck lymphedema and scar tissue - derease ROM  - had surgery on December 17, 2020. bilateral salvage neck dissections. Doing fairly well.  At the eval he had swelling under his chin above his incision line and tethering more on the right side  than the left. Pt made great progress since eval in cervical AROM , L shoulder AROM , scar tissue.  Tolerating scar mobs well lately with less discomfort -  kinesiotaping done  but pt report hard time keeping it on for lymph drainage and scar mobs because of hair growth. Pt to cont with CICA scar pad for night time - pt to cont with modify self MLD. Pt showed increase shoulder AROM and strength but show decrease ext rotation strength on the R this date - with use of chip bag made last time and doing massage - pt show decrease fibrosis under chin - measurement was 26 cm ear to ear  - Last time Flexitouch pump Rep was here to do trial  on pt -  report less fibrosis afterwards and decrease lymphedema under jaw line- showed increase AROM in cervical lat flexion and rotation compare to prior to pump. Pt can benefit from flexitouch pump to use at home. Pt cont to be limited in cervical  AROM and shoulder strength. Pt can benefit from flexitouch pump to use for head and neck lymphedema- checking on benefits and order from surgeon -  cont to be limited in  function in ADL and IADL's - pt can benefit form skilled OT services    OT Occupational Profile and History Problem Focused Assessment - Including review of records relating to presenting problem    Occupational performance deficits (Please refer to evaluation for details): ADL's;IADL's;Rest and Sleep;Work;Play;Leisure    Body Structure / Function / Physical Skills ADL;Strength;Decreased knowledge of use of DME;Decreased knowledge of precautions;Skin integrity;Sensation;Flexibility;Scar mobility;ROM;IADL;Edema;UE functional use;Pain    Rehab Potential Good    Clinical Decision Making Limited treatment options, no task modification necessary    Comorbidities Affecting Occupational Performance: None    Modification or Assistance to Complete Evaluation  No modification of tasks or assist necessary to complete eval    OT Frequency 1x / week    OT Duration 6 weeks    OT Treatment/Interventions Self-care/ADL training;Contrast Bath;Therapeutic exercise;Manual Therapy;Manual lymph drainage;DME and/or AE instruction;Therapeutic activities;Compression bandaging;Scar mobilization;Coping strategies training;Passive range of motion;Patient/family education    Consulted and Agree with Plan of Care Patient             Patient will benefit from skilled therapeutic intervention in order to improve the following deficits and impairments:   Body Structure / Function / Physical Skills: ADL, Strength, Decreased knowledge of use of DME, Decreased knowledge of precautions, Skin integrity, Sensation, Flexibility,  Scar mobility, ROM, IADL, Edema, UE functional use, Pain       Visit Diagnosis: Lymphedema, not elsewhere classified  Scar condition and fibrosis of skin  Stiffness of cervical spine  Stiffness of left shoulder, not elsewhere classified  Stiffness of right shoulder, not elsewhere classified  Muscle weakness (generalized)  Cervical pain (neck)    Problem List Patient Active Problem List   Diagnosis Date Noted   Acute pulmonary embolism ([place]) 08/15/2020   Chronic venous insufficiency 08/06/2020   Nausea with vomiting 07/06/2020   Hypomagnesemia 07/06/2020   Hypokalemia 07/06/2020   Counseling regarding advance care planning and goals of care 04/11/2020   Malignant neoplasm of base of tongue ([place]) 04/09/2020   Skin infection 03/11/2020   Tooth infection 03/11/2020   Neck swelling 03/11/2020   Encounter to establish care with new doctor 03/10/2020   Screening, lipid 03/10/2020   Screening for malignant neoplasm of prostate 03/10/2020   Weight gain 03/10/2020   Type 2 diabetes mellitus with hyperglycemia, without long-term current use of insulin ([place]) 03/10/2020   Essential hypertension 03/10/2020    Rosalyn Gess, OTR/L,CLT 02/22/2021, 4:24 PM  [facility] [address] [place], [address] Phone: [phone]   Fax:  [phone]  Name: Jasyah Theurer MRN: [id number] Date of Birth: [birth year]

## 2021-03-03 ENCOUNTER — Ambulatory Visit: Payer: 59 | Admitting: Occupational Therapy

## 2021-03-03 DIAGNOSIS — I890 Lymphedema, not elsewhere classified: Secondary | ICD-10-CM | POA: Diagnosis not present

## 2021-03-03 DIAGNOSIS — L905 Scar conditions and fibrosis of skin: Secondary | ICD-10-CM

## 2021-03-03 DIAGNOSIS — M436 Torticollis: Secondary | ICD-10-CM

## 2021-03-03 DIAGNOSIS — M25612 Stiffness of left shoulder, not elsewhere classified: Secondary | ICD-10-CM

## 2021-03-03 DIAGNOSIS — M25611 Stiffness of right shoulder, not elsewhere classified: Secondary | ICD-10-CM

## 2021-03-03 NOTE — Therapy (Signed)
[facility] [address] [place], [address] Phone: [phone]   Fax:  [phone]  Occupational Therapy Treatment  Patient Details  Name: Ocie Tino MRN: [id number] Date of Birth: [birth year] Referring Provider (OT): Dr Rogue Bussing   Encounter Date: 03/03/2021   OT End of Session - 03/03/21 1954     Visit Number 8    Number of Visits 10    Date for OT Re-Evaluation 03/15/21    OT Start Time 1941    OT Stop Time 1530    OT Time Calculation (min) 45 min    Activity Tolerance Patient tolerated treatment well    Behavior During Therapy [facility] for tasks assessed/performed             Past Medical History:  Diagnosis Date   Diabetes mellitus without complication ([place])    Hypertension    Mucositis     Past Surgical History:  Procedure Laterality Date   FEMUR SURGERY Left 1983   IR IMAGING GUIDED PORT INSERTION  04/16/2020   IR REMOVAL TUN ACCESS W/ PORT W/O FL MOD SED  01/07/2021    There were no vitals filed for this visit.   Subjective Assessment - 03/03/21 1953     Subjective  I did not hear back about the pump yet - feels like my cheeks are more swollen -but don't know if I just have more sensation coming back. L shoulder doing better - look? 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  July 2022-PET scan mild further reduction in activity in the lymph nodes in the neck. The dominant left cervical lymph node is also mildly reduced in size compared to prior. No recurrence of the tongue base mass.  FNA of the active lesion on PET scan-positive for residual malignancy-Patient reevaluated by surgery at [facility] for surgery on December 17, 2020. - 12/29/2020: Comes back in today after undergoing bilateral salvage neck dissections. Doing  fairly well. Starting to notice some swelling under his chin above his incision line as we expected. He notes some tethering more on the right side than the left. Overall, no issues. Pathology has come back showing treated and did tumors. 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 No/denies                   Pt to cont with scapula squeezes several times during day Upgrade  last week to green band for scapula squeezes and shoulder extention 2 x 12 reps Pull downs with RTB  - report did not do these- reinforce importance  Cont  L ext rotation 2 x 12 reps - 15 reps Supine scapula retraction and protraction - 2 lbs 12 reps 2 sets - supine - but also add this date in incline - WB 10reps pain free  Push ups on wall and triceps push ups - 12 reps -each  1 x day  Sidelying for R ext rotation or isometric into pillow at doorway - 3 x 6 reps - no weight and not more reps  Close chain on foam roller on wall - slides into shoulder flexion -3 x 30 sec    Cervical AROM in all planes - and can do scar mobs in comfortable end range cervical rotation  Mini massager done on scar on neck and extractor this date with some AROM in to extention and rotation   Komprex foam add to chip bag - under or on top to facilitate lymp flow to posterior anastomosis or ears                OT Education - 03/03/21 1954     Education Details progress and HEP    Person(s) Educated Patient    Methods Explanation;Demonstration;Tactile cues;Verbal cues;Handout    Comprehension Verbalized understanding;Verbal cues required;Returned demonstration                 OT Long Term Goals - 01/18/21 2154       OT LONG TERM GOAL #1   Title Pt show increase cervical AROM in all planes by 10 degrees to  drive with more ease and less pain on posterior neck    Baseline 4-5/10 pain - strain on posterior neck - decrease in all planes - flexion ,ext 30, rot to R 20, L 25, lat flexion L 10, R 20 degrees -    Time 4    Period Weeks    Status New    Target Date 02/15/21      OT LONG TERM GOAL #2   Title Pt to be independent in HEP For MLD , taping , AROM to decrease circumference in neck lymphedema with at least 2 cm    Baseline ear to ear 28 cm - decrease after mini massager in session by 1 cm - no knowledge of MLD , taping or ROM    Time 4    Period Weeks    Status New    Target Date 02/15/21      OT LONG TERM GOAL #3   Title Assess or setup pt with correct compression garment and pump to decrease lymphedema and circumference in neck to decrease risk for pain and swallowing issues    Baseline 28 cm ear to ear - no knowledge of lymphedema management - report if increase swelling - harder time swallowing    Time 6    Period Weeks    Status New    Target Date 03/01/21      OT LONG TERM GOAL #4   Title Bilateral shoulder AROM increase to [facility] and strength to peform over head reaching without symptoms or pain    Baseline report pain and fatigue over head L more than R - shoulder flexion 120 L , abd 100 adn ext decrease    Time 8    Period Weeks    Status New    Target Date 03/15/21                   Plan - 03/03/21 1955     Clinical Impression Statement Patient refer with diagnosis of head/neck lymphedema and scar tissue - derease ROM  - had surgery on December 17, 2020. bilateral salvage neck dissections. Doing fairly well.  At the eval he had swelling under his chin above his incision line and tethering more on the right side  than the left. Pt made great progress since eval in cervical AROM , L shoulder AROM., strength and scar tissue.  Tolerating scar mobs well lately with less discomfort -  kinesiotaping done  but pt report hard time keeping it on for lymph drainage and scar mobs  because of hair growth. Pt to cont with CICA scar pad for night time - pt to cont with modify self MLD. Pt showed increase shoulder AROM and strength but show decrease ext rotation strength on the R  - with use of chip bag  2 wks ago and doing massage - pt show decrease fibrosis under chin -but increase  lymphedema in cheeks? - add this date Komprex foam to use incominbation with chip bag under or over with compression to move fluid posterior to chain of ln at ears   -2 wks ago  Flexitouch pump Rep was here to do trial on pt - report less fibrosis afterwards and decrease lymphedema under jaw line- showed increase AROM in cervical lat flexion and rotation compare to prior to pump. Contacted Rep today - still waiting upon insurance for approval.  Pt can benefit from flexitouch pump to use at home. Pt cont to be limited in cervical  AROM and shoulder strength. Pt can benefit from flexitouch pump to use for head and neck lymphedema-   cont to be limited in  function in ADL and IADL's - pt can benefit form skilled OT services    OT Occupational Profile and History Problem Focused Assessment - Including review of records relating to presenting problem    Occupational performance deficits (Please refer to evaluation for details): ADL's;IADL's;Rest and Sleep;Work;Play;Leisure    Body Structure / Function / Physical Skills ADL;Strength;Decreased knowledge of use of DME;Decreased knowledge of precautions;Skin integrity;Sensation;Flexibility;Scar mobility;ROM;IADL;Edema;UE functional use;Pain    Rehab Potential Good    Clinical Decision Making Limited treatment options, no task modification necessary    Comorbidities Affecting Occupational Performance: None    Modification or Assistance to Complete Evaluation  No modification of tasks or assist necessary to complete eval    OT Frequency 1x / week    OT Duration 6 weeks    OT Treatment/Interventions Self-care/ADL training;Contrast Bath;Therapeutic exercise;Manual  Therapy;Manual lymph drainage;DME and/or AE instruction;Therapeutic activities;Compression bandaging;Scar mobilization;Coping strategies training;Passive range of motion;Patient/family education    Consulted and Agree with Plan of Care Patient             Patient will benefit from skilled therapeutic intervention in order to improve the following deficits and impairments:   Body Structure / Function / Physical Skills: ADL, Strength, Decreased knowledge of use of DME, Decreased knowledge of precautions, Skin integrity, Sensation, Flexibility, Scar mobility, ROM, IADL, Edema, UE functional use, Pain       Visit Diagnosis: Lymphedema, not elsewhere classified  Scar condition and fibrosis of skin  Stiffness of cervical spine  Stiffness of left shoulder, not elsewhere classified  Stiffness of right shoulder, not elsewhere classified    Problem List Patient Active Problem List   Diagnosis Date Noted   Acute pulmonary embolism ([place]) 08/15/2020   Chronic venous insufficiency 08/06/2020   Nausea with vomiting 07/06/2020   Hypomagnesemia 07/06/2020   Hypokalemia 07/06/2020   Counseling regarding advance care planning and goals of care 04/11/2020   Malignant neoplasm of base of tongue ([place]) 04/09/2020   Skin infection 03/11/2020   Tooth infection 03/11/2020   Neck swelling 03/11/2020   Encounter to establish care with new doctor 03/10/2020   Screening, lipid 03/10/2020   Screening for malignant neoplasm of prostate 03/10/2020   Weight gain 03/10/2020   Type 2 diabetes mellitus with hyperglycemia, without long-term current use of insulin ([place]) 03/10/2020   Essential hypertension  03/10/2020    Rosalyn Gess, OTR/L,CLT 03/03/2021, 8:00 PM  [facility] [address] [place], [address] Phone: [phone]   Fax:  [phone]  Name: Brant Peets MRN: [id number] Date of Birth: January 03, 1961

## 2021-03-08 ENCOUNTER — Ambulatory Visit: Payer: 59 | Attending: Internal Medicine | Admitting: Occupational Therapy

## 2021-03-08 DIAGNOSIS — L905 Scar conditions and fibrosis of skin: Secondary | ICD-10-CM | POA: Diagnosis not present

## 2021-03-08 DIAGNOSIS — M436 Torticollis: Secondary | ICD-10-CM | POA: Insufficient documentation

## 2021-03-08 DIAGNOSIS — M6281 Muscle weakness (generalized): Secondary | ICD-10-CM | POA: Diagnosis present

## 2021-03-08 NOTE — Therapy (Signed)
[facility] [address] [place], [address] Phone: [phone]   Fax:  [phone]  Occupational Therapy Treatment  Patient Details  Name: Joshua Bray MRN: [id number] Date of Birth: 03-Apr-1961 Referring Provider (OT): Dr Rogue Bussing   Encounter Date: 03/08/2021   OT End of Session - 03/08/21 1626     Visit Number 9    Number of Visits 10    Date for OT Re-Evaluation 03/15/21    OT Start Time 1451    OT Stop Time 1535    OT Time Calculation (min) 44 min    Activity Tolerance Patient tolerated treatment well    Behavior During Therapy [facility] for tasks assessed/performed             Past Medical History:  Diagnosis Date   Diabetes mellitus without complication ([place])    Hypertension    Mucositis     Past Surgical History:  Procedure Laterality Date   FEMUR SURGERY Left 1983   IR IMAGING GUIDED PORT INSERTION  04/16/2020   IR REMOVAL TUN ACCESS W/ PORT W/O FL MOD SED  01/07/2021    There were no vitals filed for this visit.   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  July 2022-PET scan mild further reduction in activity in the lymph nodes in the neck. The dominant left cervical lymph node is also mildly reduced in size compared to prior. No recurrence of the tongue base mass.  FNA of the active lesion on PET scan-positive for residual malignancy-Patient reevaluated by surgery at [facility] for surgery on December 17, 2020. -  12/29/2020: Comes back in today after undergoing bilateral salvage neck dissections. Doing fairly well. Starting to notice some swelling under his chin above his incision line as we expected. He notes some tethering more on the right side than the left. Overall, no issues. Pathology has come back showing treated and did tumors. 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 No/denies                         Pt to cont with scapula squeezes several times during day Cont green band for scapula squeezes and shoulder extention 2 x 12 reps Pull downs with GTB- report did not do these- reinforce importance  Cont  R ext rotation - but not 2 lbs weight -but do AROM sidelying -and no weight - 2 x 15 reps - then increase to 1lbs  2 x 12 reps - 15 reps Supine scapula retraction and protraction - 2 lbs 12 reps 2 sets - supine - but also add this date in incline - WB 10reps  pain free  Push ups on wall and triceps push ups - 12 reps -each  1 x day   Close chain on foam roller on wall - slides into shoulder flexion -3 x 30 sec    Cervical AROM in all planes - and can do scar mobs in comfortable end range cervical rotation  Done on Biodex - 35 lbs Lat pull downs - 2 x 12 reps And rows - back 35 lbs - 2 x 12 reps   Fabricated 2nd chip bag for pt to use under chin to cover under jaw too Komprex foam  to cont with with chip bag - under or on top to facilitate lymp flow to posterior anastomosis or ears   Kinesiotape done on bilateral chin and cheeks - 3 strips and lateral achor to PAA at upper traps-  Pt to keep on and take off if irritation and can use under chip bag too - xtra provided              OT Education - 03/08/21 1626     Education Details progress and HEP    Person(s)  Educated Patient    Methods Explanation;Demonstration;Tactile cues;Verbal cues;Handout    Comprehension Verbalized understanding;Verbal cues required;Returned demonstration                 OT Long Term Goals - 01/18/21 2154       OT LONG TERM GOAL #1   Title Pt show increase cervical AROM in all planes by 10 degrees to drive with more ease and less pain on posterior neck    Baseline 4-5/10 pain - strain on posterior neck - decrease in all planes - flexion ,ext 30, rot to R 20, L 25, lat flexion L 10, R 20 degrees -    Time 4    Period Weeks    Status New    Target Date 02/15/21      OT LONG TERM GOAL #2   Title Pt to be independent in HEP For MLD , taping , AROM to decrease circumference in neck lymphedema with at least 2 cm    Baseline ear to ear 28 cm - decrease after mini massager in session by 1 cm - no knowledge of MLD , taping or ROM    Time 4    Period Weeks    Status New    Target Date 02/15/21      OT LONG TERM GOAL #3   Title Assess or setup pt with correct compression garment and pump to decrease lymphedema and circumference in neck to decrease risk for pain and swallowing issues    Baseline 28 cm ear to ear - no knowledge of lymphedema management - report if increase swelling - harder time swallowing    Time 6    Period Weeks    Status New    Target Date 03/01/21      OT LONG TERM GOAL #4   Title Bilateral shoulder AROM increase to [facility] and strength to peform over head reaching without symptoms or pain    Baseline report pain and fatigue over head L more than R - shoulder flexion 120 L , abd 100 adn ext decrease    Time 8    Period Weeks    Status New    Target Date 03/15/21                   Plan - 03/08/21 1627     Clinical Impression Statement  Patient refer with diagnosis of head/neck lymphedema and scar tissue - derease ROM  - had surgery on December 17, 2020. bilateral salvage neck dissections. Doing fairly well.  At the eval he had swelling  under his chin above his incision line and tethering more on the right side  than the left. Pt made great progress since eval in cervical AROM , L shoulder AROM., strength and scar tissue.  Tolerating scar mobs well lately with less discomfort -  kinesiotaping done  but pt report hard time keeping it on for lymph drainage and scar mobs because of hair growth.  BUT this date pt still waiting for his pump and increase lymphedema below jaw R more than L - Reinforce again to do taping to facilitate lymph flow to PAA from under chin and cheeks - done this date - pt to replace as needed-  pt to cont with modify self MLD. Pt cont to show increase shoulder AROM and strength but show decrease ext rotation strength on the R but improving  -Add another chip bag this date to use under whole chin and jawin combination with  Komprex foam  and taping - to facilitate lymph flow to PA and  posterior to chain of ln at ears   -  Flexitouch pump Rep  contacted today and message left to contact pt to provided update about pump - Pt report good AROM in cervical area - still waiting upon insurance for approval.  Pt can benefit from flexitouch pump to use at home. Pt cont to be limited in cervical  AROM and shoulder strength. Pt can benefit from flexitouch pump to use for head and neck lymphedema-   cont to be limited in  function in ADL and IADL's - pt can benefit form skilled OT services    OT Occupational Profile and History Problem Focused Assessment - Including review of records relating to presenting problem    Occupational performance deficits (Please refer to evaluation for details): ADL's;IADL's;Rest and Sleep;Work;Play;Leisure    Body Structure / Function / Physical Skills ADL;Strength;Decreased knowledge of use of DME;Decreased knowledge of precautions;Skin integrity;Sensation;Flexibility;Scar mobility;ROM;IADL;Edema;UE functional use;Pain    Rehab Potential Good    Clinical Decision Making Limited treatment options, no  task modification necessary    Comorbidities Affecting Occupational Performance: None    Modification or Assistance to Complete Evaluation  No modification of tasks or assist necessary to complete eval    OT Frequency 1x / week    OT Duration 6 weeks    OT Treatment/Interventions Self-care/ADL training;Contrast Bath;Therapeutic exercise;Manual Therapy;Manual lymph drainage;DME and/or AE instruction;Therapeutic activities;Compression bandaging;Scar mobilization;Coping strategies training;Passive range of motion;Patient/family education    Consulted and Agree with Plan of Care Patient             Patient will benefit from skilled therapeutic intervention in order to improve the following deficits and impairments:   Body Structure / Function / Physical Skills: ADL, Strength, Decreased knowledge of use of DME, Decreased knowledge of precautions, Skin integrity, Sensation, Flexibility, Scar mobility, ROM, IADL, Edema, UE functional use, Pain       Visit Diagnosis: Scar condition and fibrosis of skin  Stiffness of cervical spine  Muscle weakness (generalized)    Problem List Patient Active Problem List   Diagnosis Date Noted   Acute pulmonary embolism ([place]) 08/15/2020   Chronic venous insufficiency 08/06/2020   Nausea with vomiting 07/06/2020   Hypomagnesemia 07/06/2020   Hypokalemia 07/06/2020   Counseling regarding advance care planning and goals of  care 04/11/2020   Malignant neoplasm of base of tongue ([place]) 04/09/2020   Skin infection 03/11/2020   Tooth infection 03/11/2020   Neck swelling 03/11/2020   Encounter to establish care with new doctor 03/10/2020   Screening, lipid 03/10/2020   Screening for malignant neoplasm of prostate 03/10/2020   Weight gain 03/10/2020   Type 2 diabetes mellitus with hyperglycemia, without long-term current use of insulin ([place]) 03/10/2020   Essential hypertension 03/10/2020    Rosalyn Gess, OTR/L,CLT 03/08/2021, 4:33 PM  [place] [facility] [address], [place], [address] Phone: [phone]   Fax:  [phone]  Name: Broughton Eppinger MRN: [id number] Date of Birth: 17-Apr-1961

## 2021-03-22 ENCOUNTER — Ambulatory Visit: Payer: 59 | Admitting: Occupational Therapy

## 2021-03-25 ENCOUNTER — Other Ambulatory Visit: Payer: Self-pay | Admitting: Nurse Practitioner

## 2021-04-07 ENCOUNTER — Ambulatory Visit: Payer: 59 | Admitting: Occupational Therapy

## 2021-04-11 ENCOUNTER — Ambulatory Visit: Payer: 59 | Admitting: Occupational Therapy

## 2021-07-04 ENCOUNTER — Other Ambulatory Visit: Payer: Self-pay

## 2021-07-04 ENCOUNTER — Encounter: Payer: Self-pay | Admitting: Internal Medicine

## 2021-07-04 ENCOUNTER — Inpatient Hospital Stay: Payer: 59 | Attending: Internal Medicine

## 2021-07-04 ENCOUNTER — Inpatient Hospital Stay (HOSPITAL_BASED_OUTPATIENT_CLINIC_OR_DEPARTMENT_OTHER): Payer: 59 | Admitting: Internal Medicine

## 2021-07-04 VITALS — BP 127/100 | HR 101 | Temp 98.5°F | Ht 71.0 in | Wt 262.6 lb

## 2021-07-04 DIAGNOSIS — I10 Essential (primary) hypertension: Secondary | ICD-10-CM | POA: Diagnosis not present

## 2021-07-04 DIAGNOSIS — Z79899 Other long term (current) drug therapy: Secondary | ICD-10-CM | POA: Insufficient documentation

## 2021-07-04 DIAGNOSIS — F1290 Cannabis use, unspecified, uncomplicated: Secondary | ICD-10-CM | POA: Insufficient documentation

## 2021-07-04 DIAGNOSIS — E119 Type 2 diabetes mellitus without complications: Secondary | ICD-10-CM | POA: Diagnosis not present

## 2021-07-04 DIAGNOSIS — Z7984 Long term (current) use of oral hypoglycemic drugs: Secondary | ICD-10-CM | POA: Diagnosis not present

## 2021-07-04 DIAGNOSIS — C01 Malignant neoplasm of base of tongue: Secondary | ICD-10-CM | POA: Insufficient documentation

## 2021-07-04 DIAGNOSIS — Z87891 Personal history of nicotine dependence: Secondary | ICD-10-CM | POA: Diagnosis not present

## 2021-07-04 DIAGNOSIS — R69 Illness, unspecified: Secondary | ICD-10-CM | POA: Diagnosis not present

## 2021-07-04 LAB — CBC WITH DIFFERENTIAL/PLATELET
Abs Immature Granulocytes: 0.01 10*3/uL (ref 0.00–0.07)
Basophils Absolute: 0.1 10*3/uL (ref 0.0–0.1)
Basophils Relative: 1 %
Eosinophils Absolute: 0.4 10*3/uL (ref 0.0–0.5)
Eosinophils Relative: 9 %
HCT: 48 % (ref 39.0–52.0)
Hemoglobin: 17.2 g/dL — ABNORMAL HIGH (ref 13.0–17.0)
Immature Granulocytes: 0 %
Lymphocytes Relative: 23 %
Lymphs Abs: 1 10*3/uL (ref 0.7–4.0)
MCH: 32.7 pg (ref 26.0–34.0)
MCHC: 35.8 g/dL (ref 30.0–36.0)
MCV: 91.3 fL (ref 80.0–100.0)
Monocytes Absolute: 0.4 10*3/uL (ref 0.1–1.0)
Monocytes Relative: 10 %
Neutro Abs: 2.4 10*3/uL (ref 1.7–7.7)
Neutrophils Relative %: 57 %
Platelets: 171 10*3/uL (ref 150–400)
RBC: 5.26 MIL/uL (ref 4.22–5.81)
RDW: 13.2 % (ref 11.5–15.5)
WBC: 4.3 10*3/uL (ref 4.0–10.5)
nRBC: 0 % (ref 0.0–0.2)

## 2021-07-04 LAB — COMPREHENSIVE METABOLIC PANEL
ALT: 12 U/L (ref 0–44)
AST: 18 U/L (ref 15–41)
Albumin: 3.9 g/dL (ref 3.5–5.0)
Alkaline Phosphatase: 78 U/L (ref 38–126)
Anion gap: 7 (ref 5–15)
BUN: 22 mg/dL — ABNORMAL HIGH (ref 6–20)
CO2: 27 mmol/L (ref 22–32)
Calcium: 9.2 mg/dL (ref 8.9–10.3)
Chloride: 102 mmol/L (ref 98–111)
Creatinine, Ser: 1.28 mg/dL — ABNORMAL HIGH (ref 0.61–1.24)
GFR, Estimated: >60 mL/min (ref >60)
Glucose, Bld: 135 mg/dL — ABNORMAL HIGH (ref 70–99)
Potassium: 3.9 mmol/L (ref 3.5–5.1)
Sodium: 136 mmol/L (ref 135–145)
Total Bilirubin: 0.5 mg/dL (ref 0.3–1.2)
Total Protein: 6.9 g/dL (ref 6.5–8.1)

## 2021-07-04 LAB — MAGNESIUM: Magnesium: 2.2 mg/dL (ref 1.7–2.4)

## 2021-07-04 MED ORDER — AMLODIPINE BESYLATE 10 MG PO TABS: 10.0000 mg | ORAL_TABLET | Freq: Every day | ORAL | 0 refills | Status: DC

## 2021-07-04 NOTE — Assessment & Plan Note (Addendum)
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 Dec 17, 2020-bilateral salvage neck dissections-no evidence of viable cancer noted. Continue follow up.  We will recommend a baseline CT scan neck.   # Incidental uptake noted in the anal region-reviewed the PET scan; no recent colonoscopy. Awaiting GI evaluation [pt canceled previous appt with GI]- recommend continue follow up with GI.   # Lymphedema- Left upper Torso- muscle wasting sec to Scarring- from Surgery/RT- monitor for now.   # Bil PN UE/LE-- G-2- monitor follow-up.  If worse would recommend evaluation with neurology.  # Hx of Acute PE bilateral [April 10th, 2022]-[provoked sec to chemo]. Currently OFF Eliquis-- STABLE.  # HTN- re-start [refill one time]- then defer to PCP.   # Mediport/IV Access: s/p  port explantation.  #Patient interested in disability benefits for 2022; given his treatment/surgeries.  He will get [place] paperwork  # DISPOSITION:  # follow up in 3 months-  MD; labs- cbc/cmp/mag;CT neck prior- Dr.B  Cc; Dr.Overton.

## 2021-07-04 NOTE — Progress Notes (Signed)
C/o of no muscle mass in his left side.  No longer has a pcp to rx the amlodipine.  States he has dizziness when leaning over or back.
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
## 2021-08-31 ENCOUNTER — Ambulatory Visit: Payer: 59 | Admitting: Radiation Oncology

## 2021-09-26 ENCOUNTER — Ambulatory Visit: Admission: RE | Admit: 2021-09-26 | Payer: 59 | Source: Ambulatory Visit

## 2021-09-29 ENCOUNTER — Ambulatory Visit
Admission: RE | Admit: 2021-09-29 | Discharge: 2021-09-29 | Disposition: A | Discharge status: Home / Self Care | Payer: 59 | Source: Ambulatory Visit | Attending: Internal Medicine | Admitting: Internal Medicine

## 2021-09-29 DIAGNOSIS — C01 Malignant neoplasm of base of tongue: Secondary | ICD-10-CM | POA: Diagnosis not present

## 2021-09-29 DIAGNOSIS — C760 Malignant neoplasm of head, face and neck: Secondary | ICD-10-CM | POA: Diagnosis not present

## 2021-09-29 DIAGNOSIS — K148 Other diseases of tongue: Secondary | ICD-10-CM | POA: Diagnosis not present

## 2021-09-29 LAB — POCT I-STAT CREATININE: Creatinine, Ser: 1.4 mg/dL — ABNORMAL HIGH (ref 0.61–1.24)

## 2021-09-29 MED ORDER — IOHEXOL 300 MG/ML  SOLN
75.0000 mL | Freq: Once | INTRAMUSCULAR | Status: AC | PRN
  Administered 2021-09-29: 75 mL via INTRAVENOUS

## 2021-10-05 ENCOUNTER — Inpatient Hospital Stay (HOSPITAL_BASED_OUTPATIENT_CLINIC_OR_DEPARTMENT_OTHER): Payer: 59 | Admitting: Internal Medicine

## 2021-10-05 ENCOUNTER — Encounter: Payer: Self-pay | Admitting: Internal Medicine

## 2021-10-05 ENCOUNTER — Other Ambulatory Visit: Payer: Self-pay

## 2021-10-05 ENCOUNTER — Inpatient Hospital Stay: Payer: 59 | Attending: Internal Medicine

## 2021-10-05 DIAGNOSIS — I10 Essential (primary) hypertension: Secondary | ICD-10-CM | POA: Insufficient documentation

## 2021-10-05 DIAGNOSIS — Z87891 Personal history of nicotine dependence: Secondary | ICD-10-CM | POA: Insufficient documentation

## 2021-10-05 DIAGNOSIS — C01 Malignant neoplasm of base of tongue: Secondary | ICD-10-CM | POA: Insufficient documentation

## 2021-10-05 DIAGNOSIS — Z79899 Other long term (current) drug therapy: Secondary | ICD-10-CM | POA: Diagnosis not present

## 2021-10-05 DIAGNOSIS — I890 Lymphedema, not elsewhere classified: Secondary | ICD-10-CM | POA: Insufficient documentation

## 2021-10-05 DIAGNOSIS — Z7984 Long term (current) use of oral hypoglycemic drugs: Secondary | ICD-10-CM | POA: Insufficient documentation

## 2021-10-05 DIAGNOSIS — E119 Type 2 diabetes mellitus without complications: Secondary | ICD-10-CM | POA: Insufficient documentation

## 2021-10-05 LAB — CBC WITH DIFFERENTIAL/PLATELET
Abs Immature Granulocytes: 0.01 10*3/uL (ref 0.00–0.07)
Basophils Absolute: 0.1 10*3/uL (ref 0.0–0.1)
Basophils Relative: 1 %
Eosinophils Absolute: 0.4 10*3/uL (ref 0.0–0.5)
Eosinophils Relative: 9 %
HCT: 46.9 % (ref 39.0–52.0)
Hemoglobin: 16.6 g/dL (ref 13.0–17.0)
Immature Granulocytes: 0 %
Lymphocytes Relative: 24 %
Lymphs Abs: 1.1 10*3/uL (ref 0.7–4.0)
MCH: 32.9 pg (ref 26.0–34.0)
MCHC: 35.4 g/dL (ref 30.0–36.0)
MCV: 92.9 fL (ref 80.0–100.0)
Monocytes Absolute: 0.4 10*3/uL (ref 0.1–1.0)
Monocytes Relative: 10 %
Neutro Abs: 2.4 10*3/uL (ref 1.7–7.7)
Neutrophils Relative %: 56 %
Platelets: 165 10*3/uL (ref 150–400)
RBC: 5.05 MIL/uL (ref 4.22–5.81)
RDW: 13.3 % (ref 11.5–15.5)
WBC: 4.4 10*3/uL (ref 4.0–10.5)
nRBC: 0 % (ref 0.0–0.2)

## 2021-10-05 LAB — COMPREHENSIVE METABOLIC PANEL
ALT: 12 U/L (ref 0–44)
AST: 17 U/L (ref 15–41)
Albumin: 3.7 g/dL (ref 3.5–5.0)
Alkaline Phosphatase: 71 U/L (ref 38–126)
Anion gap: 6 (ref 5–15)
BUN: 18 mg/dL (ref 6–20)
CO2: 27 mmol/L (ref 22–32)
Calcium: 8.8 mg/dL — ABNORMAL LOW (ref 8.9–10.3)
Chloride: 104 mmol/L (ref 98–111)
Creatinine, Ser: 1.15 mg/dL (ref 0.61–1.24)
GFR, Estimated: >60 mL/min (ref >60)
Glucose, Bld: 125 mg/dL — ABNORMAL HIGH (ref 70–99)
Potassium: 4 mmol/L (ref 3.5–5.1)
Sodium: 137 mmol/L (ref 135–145)
Total Bilirubin: 0.7 mg/dL (ref 0.3–1.2)
Total Protein: 6.9 g/dL (ref 6.5–8.1)

## 2021-10-05 LAB — MAGNESIUM: Magnesium: 2.1 mg/dL (ref 1.7–2.4)

## 2021-10-05 MED ORDER — AMLODIPINE BESYLATE 10 MG PO TABS
10.0000 mg | ORAL_TABLET | Freq: Every day | ORAL | 0 refills | Status: AC
Start: 2021-10-05 — End: ?
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
## 2021-10-05 NOTE — Progress Notes (Signed)
Patient denies new problems/concerns today.    Has not established with new PCP and is out of Amlodipine for 2-3 weeks, bp in office today 175/98 HR 77.

## 2021-10-05 NOTE — Patient Instructions (Signed)
#   recommend follow up with Dr.Bennett.

## 2021-10-05 NOTE — Assessment & Plan Note (Addendum)
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 MAY 2023- CT scan No evidence of disease when compared to June 2022 PET CT. Distorted appearance of the tongue base would be concerning but is morphologically stable and was negative by prior PET-CT. Interval bilateral neck dissection with no residual adenopathy. STABLE.    # Follow clinically moving forward with an imaging.  Discussed with the patient.  Again recommended patient follow-up with Dr. Richardson Landry.  # Incidental uptake noted in the anal region-reviewed the PET scan; no recent colonoscopy. Awaiting GI evaluation [pt canceled previous appt with GI]- again recommend continue follow up with GI.   # Lymphedema- Left upper Torso- muscle wasting sec to Scarring- from Surgery/RT- monitor for now. S/p Maureen- on pump intermittently-  STABLE.   # Bil PN UE/LE-- G-2- monitor follow-up.  If worse would recommend evaluation with neurology. STABLE.   # Hx of Acute PE bilateral [April 10th, 2022]-[provoked sec to chemo]. Currently OFF Eliquis--  STABLE.  # HTN- poorly controlled- re-start [refill one time again]-again stressed the importance of follow up PCP.   # Mediport/IV Access: s/p  port explantation.  #Incidental findings on Imaging CT MAY , 2023: atherosclerosis I reviewed/discussed/counseled the patient.   # DISPOSITION:  # follow up in 3 months-  MD; labs- cbc/cmp/mag;TSH- Dr.B  # I reviewed the blood work- with the patient in detail; also reviewed the imaging independently [as summarized above]; and with the patient in detail.

## 2022-01-05 ENCOUNTER — Other Ambulatory Visit: Payer: 59

## 2022-01-05 ENCOUNTER — Ambulatory Visit: Payer: 59 | Admitting: Internal Medicine

## 2022-01-18 ENCOUNTER — Inpatient Hospital Stay: Payer: 59 | Admitting: Medical Oncology

## 2022-01-18 ENCOUNTER — Inpatient Hospital Stay: Payer: 59 | Attending: Internal Medicine

## 2022-01-26 ENCOUNTER — Other Ambulatory Visit: Payer: Self-pay | Admitting: Internal Medicine

## 2022-01-26 DIAGNOSIS — I10 Essential (primary) hypertension: Secondary | ICD-10-CM

## 2022-01-26 NOTE — Telephone Encounter (Signed)
Last visit with MD on 10/05/21--HTN- poorly controlled- re-start [refill one time again]-again stressed the importance of follow up PCP.   Sent patient a MyChart message asking if he has seen PCP for bp.

## 2023-04-09 IMAGING — DX DG CHEST 1V PORT
1 series · 1 of 1 positions shown · non-contrast
Comparison: PET CT 04/21/2020

CLINICAL DATA: Pleuritic chest pain

EXAM:
PORTABLE CHEST 1 VIEW

[chest ap]
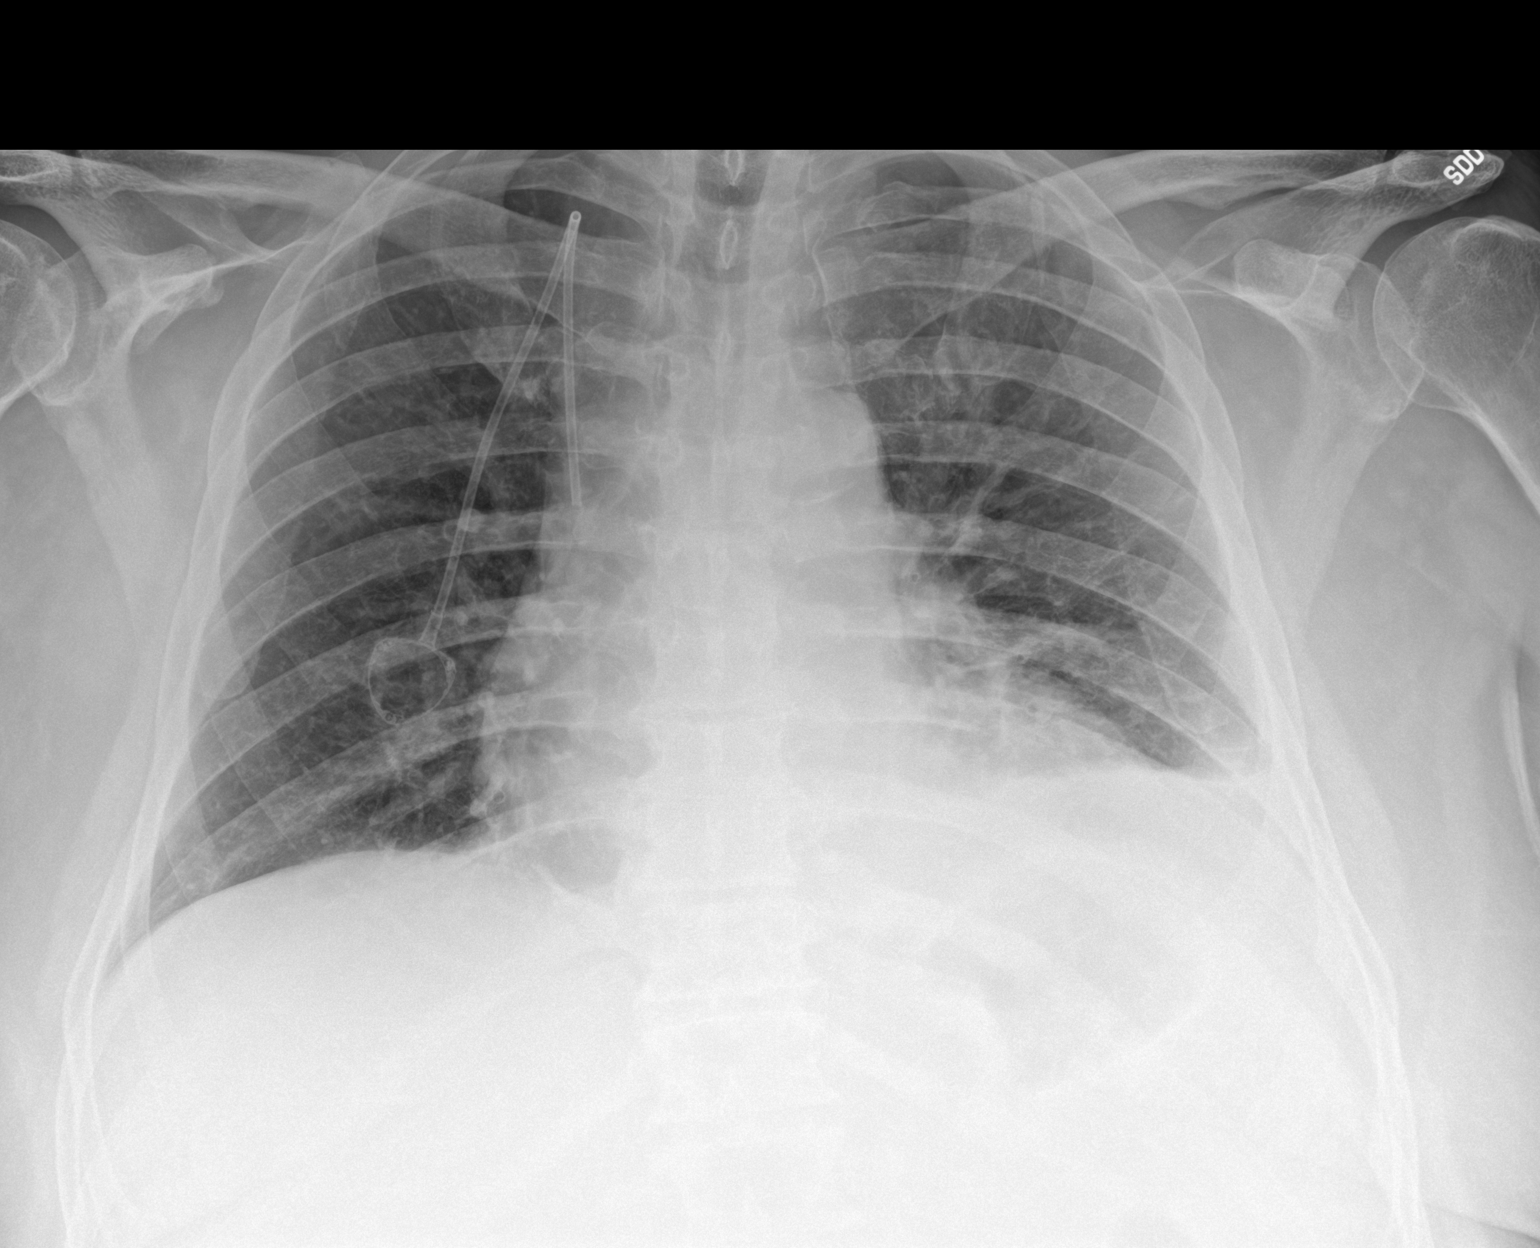

[1 of 1 positions shown; findings below may reference images not displayed]

FINDINGS: Right-sided central venous port tip over the SVC. Small left-sided
pleural effusion with basilar airspace disease. Borderline
cardiomegaly. No pneumothorax
IMPRESSION: Small left pleural effusion with basilar airspace disease,
atelectasis or pneumonia.

## 2023-05-16 IMAGING — PT NM PET TUM IMG RESTAG (PS) SKULL BASE T - THIGH
1 of 11 series · 1 of 25 positions shown · non-contrast
Comparison: 04/21/2020

CLINICAL DATA: Subsequent treatment strategy for head neck cancer.

EXAM:
NUCLEAR MEDICINE PET SKULL BASE TO THIGH
TECHNIQUE: 14.3 mCi F-18 FDG was injected intravenously. Full-ring PET imaging
was performed from the skull base to thigh after the radiotracer. CT
data was obtained and used for attenuation correction and anatomic
localization.
Fasting blood glucose: 114 mg/dl

[Series 3: ct wb 5.0 b30f · axial · 5.0mm · 0.98mm/px · 1 of 329 slices shown]
[im 329/329  brain]
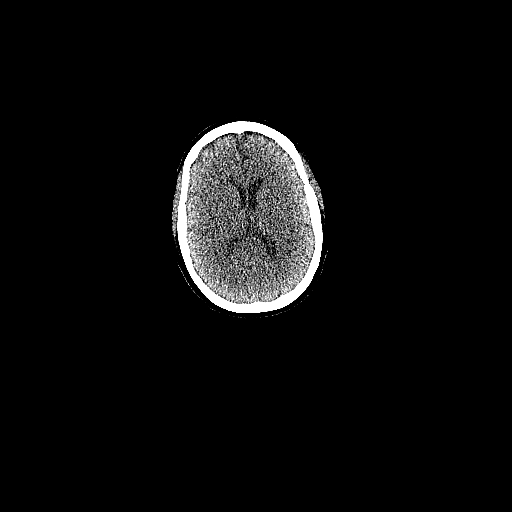

[1 of 25 positions shown; findings below may reference images not displayed]

FINDINGS: Mediastinal blood pool activity: SUV max

Liver activity: SUV max NA

NECK: Marked improvement in the hypermetabolism seen previously at
the base of tongue. On today's exam there is only a tiny focus of
hypermetabolic uptake in the midline base of tongue with SUV max =
5.3 which compares 2 uptake of 19.3 previously.

Bulky cervical lymphadenopathy has also decreased substantially. Low
left cervical node measuring 2.7 cm short axis on 48/3 has decreased
from 5.8 cm short axis (remeasured) on the prior study. SUV max =
3.5 today compared to 14.3 previously. Index left cervical node
measuring 1.9 cm short axis today, decreased from 3.4 cm
(remeasured) previously SUV max = 2.7, decreased from 9.1.

Incidental CT findings: none

CHEST: Right-sided supraclavicular and thoracic inlet hypermetabolic
lymphadenopathy seen previously has similarly decreased in the
interval. 8 mm short axis right supraclavicular node on 61/3 was
cm previously (remeasured). SUV max = 1.7 today compared to
previously.

No new unexpected hypermetabolic disease in the chest.

Incidental CT findings: Right Port-A-Cath tip is in the distal SVC.
Atherosclerotic calcification is noted in the wall of the thoracic
aorta. 9 mm nodular opacity is seen along the right major fissure
(105/3), new in the interval and without hypermetabolism. 1.9 cm
nodular opacity is seen in the posterior left costophrenic sulcus
(132/3) without hypermetabolic activity.

ABDOMEN/PELVIS: No abnormal hypermetabolic activity within the
liver, pancreas, adrenal glands, or spleen. No hypermetabolic lymph
nodes in the abdomen or pelvis.

Incidental CT findings: none

SKELETON: No focal hypermetabolic activity to suggest skeletal
metastasis.

Incidental CT findings: none
IMPRESSION: 1. Interval decrease in hypermetabolism associated with the primary
site of disease.
2. Metastatic bilateral cervical and right supraclavicular/thoracic
inlet lymphadenopathy is decreased in size and hypermetabolism.
3. No new or progressive disease on today's exam.
4.  Aortic Atherosclerois ([id number]-170.0)

## 2023-08-29 ENCOUNTER — Encounter: Payer: Self-pay | Admitting: Internal Medicine

## 2023-08-30 ENCOUNTER — Encounter: Payer: Self-pay | Admitting: *Deleted
